# Patient Record
Sex: Female | Born: 1945 | Race: Black or African American | Hispanic: No | Smoking: Former smoker
Health system: Southern US, Community
[De-identification: ages and names within clinical notes are randomized; demographics above are authoritative.]

## PROBLEM LIST (undated history)

## (undated) DIAGNOSIS — E119 Type 2 diabetes mellitus without complications: Secondary | ICD-10-CM

## (undated) DIAGNOSIS — E7800 Pure hypercholesterolemia, unspecified: Secondary | ICD-10-CM

## (undated) DIAGNOSIS — K219 Gastro-esophageal reflux disease without esophagitis: Secondary | ICD-10-CM

## (undated) DIAGNOSIS — I10 Essential (primary) hypertension: Secondary | ICD-10-CM

## (undated) HISTORY — DX: Gastro-esophageal reflux disease without esophagitis: K21.9

## (undated) HISTORY — PX: BREAST BIOPSY: SHX20

## (undated) HISTORY — DX: Pure hypercholesterolemia, unspecified: E78.00

## (undated) HISTORY — DX: Essential (primary) hypertension: I10

## (undated) HISTORY — PX: TOOTH EXTRACTION: SHX859

## (undated) HISTORY — DX: Type 2 diabetes mellitus without complications: E11.9

---

## 1977-12-04 HISTORY — PX: TUBAL LIGATION: SHX77

## 2000-02-23 ENCOUNTER — Encounter: Payer: Self-pay | Admitting: Surgery

## 2000-02-23 ENCOUNTER — Encounter (INDEPENDENT_AMBULATORY_CARE_PROVIDER_SITE_OTHER): Payer: Self-pay

## 2000-02-23 ENCOUNTER — Ambulatory Visit (HOSPITAL_COMMUNITY): Admission: RE | Admit: 2000-02-23 | Discharge: 2000-02-23 | Payer: Self-pay | Admitting: Surgery

## 2001-08-08 ENCOUNTER — Encounter: Payer: Self-pay | Admitting: Internal Medicine

## 2001-08-08 ENCOUNTER — Ambulatory Visit (HOSPITAL_COMMUNITY): Admission: RE | Admit: 2001-08-08 | Discharge: 2001-08-08 | Payer: Self-pay | Admitting: Internal Medicine

## 2002-09-18 ENCOUNTER — Ambulatory Visit (HOSPITAL_COMMUNITY): Admission: RE | Admit: 2002-09-18 | Discharge: 2002-09-18 | Payer: Self-pay | Admitting: *Deleted

## 2002-09-18 ENCOUNTER — Encounter: Payer: Self-pay | Admitting: Internal Medicine

## 2003-09-30 ENCOUNTER — Ambulatory Visit (HOSPITAL_COMMUNITY): Admission: RE | Admit: 2003-09-30 | Discharge: 2003-09-30 | Payer: Self-pay | Admitting: Internal Medicine

## 2004-10-18 ENCOUNTER — Ambulatory Visit (HOSPITAL_COMMUNITY): Admission: RE | Admit: 2004-10-18 | Discharge: 2004-10-18 | Payer: Self-pay | Admitting: Internal Medicine

## 2005-10-25 ENCOUNTER — Ambulatory Visit (HOSPITAL_COMMUNITY): Admission: RE | Admit: 2005-10-25 | Discharge: 2005-10-25 | Payer: Self-pay | Admitting: Internal Medicine

## 2005-11-15 ENCOUNTER — Encounter: Admission: RE | Admit: 2005-11-15 | Discharge: 2005-11-15 | Payer: Self-pay | Admitting: Internal Medicine

## 2006-04-24 ENCOUNTER — Ambulatory Visit: Payer: Self-pay | Admitting: Gastroenterology

## 2006-05-14 ENCOUNTER — Encounter: Admission: RE | Admit: 2006-05-14 | Discharge: 2006-05-14 | Payer: Self-pay | Admitting: Internal Medicine

## 2006-11-14 ENCOUNTER — Encounter: Admission: RE | Admit: 2006-11-14 | Discharge: 2006-11-14 | Payer: Self-pay | Admitting: Internal Medicine

## 2007-12-12 ENCOUNTER — Encounter: Admission: RE | Admit: 2007-12-12 | Discharge: 2007-12-12 | Payer: Self-pay | Admitting: Internal Medicine

## 2008-06-04 ENCOUNTER — Ambulatory Visit: Payer: Self-pay | Admitting: Unknown Physician Specialty

## 2008-12-28 ENCOUNTER — Encounter: Admission: RE | Admit: 2008-12-28 | Discharge: 2008-12-28 | Payer: Self-pay | Admitting: Internal Medicine

## 2009-12-29 ENCOUNTER — Encounter: Admission: RE | Admit: 2009-12-29 | Discharge: 2009-12-29 | Payer: Self-pay | Admitting: Internal Medicine

## 2010-12-24 ENCOUNTER — Encounter: Payer: Self-pay | Admitting: Internal Medicine

## 2010-12-27 ENCOUNTER — Other Ambulatory Visit: Payer: Self-pay | Admitting: *Deleted

## 2010-12-27 DIAGNOSIS — Z1239 Encounter for other screening for malignant neoplasm of breast: Secondary | ICD-10-CM

## 2011-01-10 ENCOUNTER — Ambulatory Visit
Admission: RE | Admit: 2011-01-10 | Discharge: 2011-01-10 | Disposition: A | Discharge status: Home / Self Care | Payer: BC Managed Care – PPO | Source: Ambulatory Visit | Attending: *Deleted | Admitting: *Deleted

## 2011-01-10 DIAGNOSIS — Z1239 Encounter for other screening for malignant neoplasm of breast: Secondary | ICD-10-CM

## 2011-03-27 ENCOUNTER — Other Ambulatory Visit: Payer: Self-pay | Admitting: Internal Medicine

## 2011-03-27 DIAGNOSIS — Z1239 Encounter for other screening for malignant neoplasm of breast: Secondary | ICD-10-CM

## 2011-09-04 ENCOUNTER — Ambulatory Visit: Payer: Self-pay | Admitting: Gastroenterology

## 2011-09-04 LAB — HM COLONOSCOPY

## 2011-12-28 ENCOUNTER — Other Ambulatory Visit: Payer: Self-pay | Admitting: Internal Medicine

## 2011-12-28 DIAGNOSIS — Z1231 Encounter for screening mammogram for malignant neoplasm of breast: Secondary | ICD-10-CM

## 2012-01-12 ENCOUNTER — Ambulatory Visit
Admission: RE | Admit: 2012-01-12 | Discharge: 2012-01-12 | Disposition: A | Discharge status: Home / Self Care | Payer: Medicare Other | Source: Ambulatory Visit | Attending: Internal Medicine | Admitting: Internal Medicine

## 2012-01-12 ENCOUNTER — Ambulatory Visit
Admission: RE | Admit: 2012-01-12 | Discharge: 2012-01-12 | Disposition: A | Discharge status: Home / Self Care | Payer: BC Managed Care – PPO | Source: Ambulatory Visit | Attending: Internal Medicine | Admitting: Internal Medicine

## 2012-01-12 ENCOUNTER — Other Ambulatory Visit: Payer: Self-pay | Admitting: Internal Medicine

## 2012-01-12 DIAGNOSIS — Z1231 Encounter for screening mammogram for malignant neoplasm of breast: Secondary | ICD-10-CM

## 2012-10-10 ENCOUNTER — Telehealth: Payer: Self-pay | Admitting: *Deleted

## 2012-10-10 NOTE — Telephone Encounter (Signed)
[facility] on [address] to call in script for Crestor 10mg 

## 2012-10-11 NOTE — Telephone Encounter (Signed)
Open in error

## 2012-10-23 ENCOUNTER — Telehealth: Payer: Self-pay | Admitting: Internal Medicine

## 2012-10-23 NOTE — Telephone Encounter (Signed)
Refill request for advair 100-50 diskus [id number] 1 dose by mouth twice a day Patient does have an appointment on 11/14/12.

## 2012-10-25 MED ORDER — FLUTICASONE-SALMETEROL 100-50 MCG/DOSE IN AEPB: 1.0000 | INHALATION_SPRAY | Freq: Two times a day (BID) | RESPIRATORY_TRACT | Status: DC

## 2012-10-25 NOTE — Telephone Encounter (Signed)
Sent in to pharmacy.  

## 2012-11-13 ENCOUNTER — Encounter: Payer: Self-pay | Admitting: *Deleted

## 2012-11-14 ENCOUNTER — Ambulatory Visit: Payer: Medicare Other | Admitting: Internal Medicine

## 2012-11-14 ENCOUNTER — Encounter: Payer: Self-pay | Admitting: Internal Medicine

## 2012-11-14 DIAGNOSIS — R739 Hyperglycemia, unspecified: Secondary | ICD-10-CM | POA: Insufficient documentation

## 2012-11-14 DIAGNOSIS — I10 Essential (primary) hypertension: Secondary | ICD-10-CM | POA: Insufficient documentation

## 2012-11-14 DIAGNOSIS — D649 Anemia, unspecified: Secondary | ICD-10-CM | POA: Insufficient documentation

## 2012-11-14 DIAGNOSIS — E1165 Type 2 diabetes mellitus with hyperglycemia: Secondary | ICD-10-CM | POA: Insufficient documentation

## 2012-11-14 DIAGNOSIS — E7800 Pure hypercholesterolemia, unspecified: Secondary | ICD-10-CM | POA: Insufficient documentation

## 2012-11-14 DIAGNOSIS — E538 Deficiency of other specified B group vitamins: Secondary | ICD-10-CM | POA: Insufficient documentation

## 2012-11-14 DIAGNOSIS — K219 Gastro-esophageal reflux disease without esophagitis: Secondary | ICD-10-CM | POA: Insufficient documentation

## 2012-11-14 DIAGNOSIS — E119 Type 2 diabetes mellitus without complications: Secondary | ICD-10-CM | POA: Insufficient documentation

## 2012-11-14 NOTE — Progress Notes (Signed)
  Subjective:    Patient ID: Lisa Robles, female    DOB: [birth year], 66 y.o.   MRN: [id number]  HPI    Review of Systems     Objective:   Physical Exam        Assessment & Plan:  Opened for extraction only.

## 2012-11-14 NOTE — Assessment & Plan Note (Deleted)
ENT previously saw changes c/w acid reflux.

## 2012-12-06 ENCOUNTER — Encounter: Payer: Self-pay | Admitting: Internal Medicine

## 2012-12-06 ENCOUNTER — Ambulatory Visit (INDEPENDENT_AMBULATORY_CARE_PROVIDER_SITE_OTHER): Payer: Medicare PPO | Admitting: Internal Medicine

## 2012-12-06 VITALS — BP 140/88 | HR 88 | Temp 98.3°F | Ht 63.0 in | Wt 184.0 lb

## 2012-12-06 DIAGNOSIS — E7800 Pure hypercholesterolemia, unspecified: Secondary | ICD-10-CM

## 2012-12-06 DIAGNOSIS — D649 Anemia, unspecified: Secondary | ICD-10-CM

## 2012-12-06 DIAGNOSIS — R5381 Other malaise: Secondary | ICD-10-CM

## 2012-12-06 DIAGNOSIS — E538 Deficiency of other specified B group vitamins: Secondary | ICD-10-CM

## 2012-12-06 DIAGNOSIS — Z139 Encounter for screening, unspecified: Secondary | ICD-10-CM

## 2012-12-06 DIAGNOSIS — R5383 Other fatigue: Secondary | ICD-10-CM

## 2012-12-06 DIAGNOSIS — K219 Gastro-esophageal reflux disease without esophagitis: Secondary | ICD-10-CM

## 2012-12-06 DIAGNOSIS — I10 Essential (primary) hypertension: Secondary | ICD-10-CM

## 2012-12-06 DIAGNOSIS — R739 Hyperglycemia, unspecified: Secondary | ICD-10-CM

## 2012-12-06 DIAGNOSIS — R7309 Other abnormal glucose: Secondary | ICD-10-CM

## 2012-12-06 MED ORDER — CYANOCOBALAMIN 1000 MCG/ML IJ SOLN
1000.0000 ug | Freq: Once | INTRAMUSCULAR | Status: AC
  Administered 2012-12-06: 1000 ug via INTRAMUSCULAR

## 2012-12-08 ENCOUNTER — Encounter: Payer: Self-pay | Admitting: Internal Medicine

## 2012-12-08 NOTE — Progress Notes (Signed)
  Subjective:    Patient ID: Lisa Robles, female    DOB: 07-29-1946, 67 y.o.   MRN: [id number]  HPI 67 year old female with past history of hypercholesterolemia, hypertension, hyperglycemia and GERD.  She comes in today to follow up on these issues as well as for a complete physical exam.  Previously traveled to [place].  Had some drainage.  This has resolved.  No cardiac symptoms with increased activity or exertion.  Breathing stable.  No bowel problems.  Seeing Dr Logan Bores.  He is following her pap smears.  Had her EGD/colonoscopy last year.  States everything checked out fine.  Overall she feels she is doing well.    Past Medical History  Diagnosis Date  . Hypercholesteremia   . Hypertension   . GERD (gastroesophageal reflux disease)     Current Outpatient Prescriptions on File Prior to Visit  Medication Sig Dispense Refill  . albuterol (PROVENTIL HFA;VENTOLIN HFA) 108 (90 BASE) MCG/ACT inhaler Inhale 2 puffs into the lungs every 6 (six) hours as needed.      Marland Kitchen aspirin 81 MG tablet Take 81 mg by mouth daily.      . Fluticasone-Salmeterol (ADVAIR) 100-50 MCG/DOSE AEPB Inhale 1 puff into the lungs 2 (two) times daily.  60 each  0  . hydrochlorothiazide (HYDRODIURIL) 25 MG tablet Take 25 mg by mouth daily.      Marland Kitchen omeprazole (PRILOSEC) 20 MG capsule Take 20 mg by mouth daily.      . rosuvastatin (CRESTOR) 10 MG tablet 1/2 tab daily        Review of Systems Patient denies any headache, lightheadedness or dizziness. No sinus or allergy symptoms now.   No chest pain, tightness or palpitations.  No increased shortness of breath, cough or congestion.  No nausea or vomiting.  No acid reflux.  No abdominal pain or cramping.  No bowel change, such as diarrhea, constipation, BRBPR or melana.  No urine change.        Objective:   Physical Exam Filed Vitals:   12/06/12 1528  BP: 140/88  Pulse: 88  Temp: 98.3 F (52.62 C)   67 year old female in no acute distress.   HEENT:  Nares- clear.   Oropharynx - without lesions. NECK:  Supple.  Nontender.  No audible bruit.  HEART:  Appears to be regular. LUNGS:  No crackles or wheezing audible.  Respirations even and unlabored.  RADIAL PULSE:  Equal bilaterally.    BREASTS:  No nipple discharge or nipple retraction present.  Could not appreciate any distinct nodules or axillary adenopathy.  ABDOMEN:  Soft, nontender.  Bowel sounds present and normal.  No audible abdominal bruit.  GU:  Performed through GYN.     EXTREMITIES:  No increased edema present.  DP pulses palpable and equal bilaterally.           Assessment & Plan:  PREVIOUS ABNORMAL PAP.  Seeing Dr Logan Bores now.  Planning to follow up with GYN.    PULMONARY.  Breathing stable.    CARDIOVASCULAR.  Asymptomatic.  Stress test 06/28/09 - no ischemia. ECHO per my 06/25/09 note.  Discussed with cardiology - felt no further w/up warranted.    HEALTH MAINTENANCE.  Physical today.  Pelvic/pap through GYN.  Colonoscopy 04/24/06 - diverticulosis.  Just had repeat colonoscopy/EGD last year.  Obtain results.  Had mammogram this year - obtain results.  Pneumovax 07/18/11.

## 2012-12-08 NOTE — Assessment & Plan Note (Signed)
Symptoms controlled.  Same med regimen.  

## 2012-12-08 NOTE — Assessment & Plan Note (Addendum)
Check cbc with next labs.  Colonoscopy as outlined.  Just had follow up colonoscopy and EGD.  Obtain results.  Continue B12 injections.     

## 2012-12-08 NOTE — Assessment & Plan Note (Signed)
On crestor.  Low cholesterol diet and exercise.  Check lipid panel and liver function.  

## 2012-12-08 NOTE — Assessment & Plan Note (Signed)
Continue B12 injections.   

## 2012-12-08 NOTE — Assessment & Plan Note (Signed)
Low carb diet.  Exercise.  Check metabolic panel and a1c.

## 2012-12-08 NOTE — Assessment & Plan Note (Signed)
Blood pressure borderline today.  Have her spot check her pressures.  Get her back in soon to reassess.  Check metabolic panel.

## 2012-12-12 ENCOUNTER — Other Ambulatory Visit (INDEPENDENT_AMBULATORY_CARE_PROVIDER_SITE_OTHER): Payer: Medicare PPO

## 2012-12-12 DIAGNOSIS — R5381 Other malaise: Secondary | ICD-10-CM

## 2012-12-12 DIAGNOSIS — I10 Essential (primary) hypertension: Secondary | ICD-10-CM

## 2012-12-12 DIAGNOSIS — R739 Hyperglycemia, unspecified: Secondary | ICD-10-CM

## 2012-12-12 DIAGNOSIS — R7309 Other abnormal glucose: Secondary | ICD-10-CM

## 2012-12-12 DIAGNOSIS — D649 Anemia, unspecified: Secondary | ICD-10-CM

## 2012-12-12 DIAGNOSIS — R5383 Other fatigue: Secondary | ICD-10-CM

## 2012-12-12 DIAGNOSIS — E7800 Pure hypercholesterolemia, unspecified: Secondary | ICD-10-CM

## 2012-12-12 LAB — CBC WITH DIFFERENTIAL/PLATELET
Basophils Absolute: 0.1 10*3/uL (ref 0.0–0.1)
Basophils Relative: 0.6 % (ref 0.0–3.0)
Eosinophils Absolute: 0.1 10*3/uL (ref 0.0–0.7)
Eosinophils Relative: 1.4 % (ref 0.0–5.0)
HCT: 37.6 % (ref 36.0–46.0)
Hemoglobin: 12.3 g/dL (ref 12.0–15.0)
Lymphocytes Relative: 36.9 % (ref 12.0–46.0)
Lymphs Abs: 3 10*3/uL (ref 0.7–4.0)
MCHC: 32.7 g/dL (ref 30.0–36.0)
MCV: 92.4 fl (ref 78.0–100.0)
Monocytes Absolute: 0.8 10*3/uL (ref 0.1–1.0)
Monocytes Relative: 9.3 % (ref 3.0–12.0)
Neutro Abs: 4.2 10*3/uL (ref 1.4–7.7)
Neutrophils Relative %: 51.8 % (ref 43.0–77.0)
Platelets: 291 10*3/uL (ref 150.0–400.0)
RBC: 4.07 Mil/uL (ref 3.87–5.11)
RDW: 14.2 % (ref 11.5–14.6)
WBC: 8.2 10*3/uL (ref 4.5–10.5)

## 2012-12-12 LAB — LIPID PANEL
Cholesterol: 191 mg/dL (ref 0–200)
HDL: 51 mg/dL (ref >39.00)
LDL Cholesterol: 123 mg/dL — ABNORMAL HIGH (ref 0–99)
Total CHOL/HDL Ratio: 4
Triglycerides: 85 mg/dL (ref 0.0–149.0)
VLDL: 17 mg/dL (ref 0.0–40.0)

## 2012-12-12 LAB — TSH: TSH: 1.33 u[IU]/mL (ref 0.35–5.50)

## 2012-12-12 LAB — MICROALBUMIN / CREATININE URINE RATIO
Creatinine,U: 123.5 mg/dL
Microalb Creat Ratio: 0.6 mg/g (ref 0.0–30.0)
Microalb, Ur: 0.8 mg/dL (ref 0.0–1.9)

## 2012-12-12 LAB — HEPATIC FUNCTION PANEL
ALT: 18 U/L (ref 0–35)
AST: 22 U/L (ref 0–37)
Albumin: 3.8 g/dL (ref 3.5–5.2)
Alkaline Phosphatase: 65 U/L (ref 39–117)
Bilirubin, Direct: 0.1 mg/dL (ref 0.0–0.3)
Total Bilirubin: 0.7 mg/dL (ref 0.3–1.2)
Total Protein: 7.8 g/dL (ref 6.0–8.3)

## 2012-12-12 LAB — BASIC METABOLIC PANEL
BUN: 12 mg/dL (ref 6–23)
CO2: 29 mEq/L (ref 19–32)
Calcium: 9.5 mg/dL (ref 8.4–10.5)
Chloride: 100 mEq/L (ref 96–112)
Creatinine, Ser: 0.7 mg/dL (ref 0.4–1.2)
GFR: 101.79 mL/min (ref >60.00)
Glucose, Bld: 105 mg/dL — ABNORMAL HIGH (ref 70–99)
Potassium: 3.7 mEq/L (ref 3.5–5.1)
Sodium: 136 mEq/L (ref 135–145)

## 2012-12-12 LAB — HEMOGLOBIN A1C: Hgb A1c MFr Bld: 6.2 % (ref 4.6–6.5)

## 2013-01-03 ENCOUNTER — Other Ambulatory Visit: Payer: Self-pay | Admitting: Internal Medicine

## 2013-01-03 DIAGNOSIS — Z1231 Encounter for screening mammogram for malignant neoplasm of breast: Secondary | ICD-10-CM

## 2013-01-16 ENCOUNTER — Ambulatory Visit: Payer: Medicare PPO | Admitting: Internal Medicine

## 2013-01-27 ENCOUNTER — Encounter: Payer: Self-pay | Admitting: Internal Medicine

## 2013-01-27 ENCOUNTER — Ambulatory Visit (INDEPENDENT_AMBULATORY_CARE_PROVIDER_SITE_OTHER): Payer: Medicare PPO | Admitting: Internal Medicine

## 2013-01-27 VITALS — BP 120/70 | HR 69 | Temp 98.5°F | Ht 63.0 in | Wt 179.5 lb

## 2013-01-27 DIAGNOSIS — E7800 Pure hypercholesterolemia, unspecified: Secondary | ICD-10-CM

## 2013-01-27 DIAGNOSIS — N390 Urinary tract infection, site not specified: Secondary | ICD-10-CM

## 2013-01-27 DIAGNOSIS — R7309 Other abnormal glucose: Secondary | ICD-10-CM

## 2013-01-27 DIAGNOSIS — I10 Essential (primary) hypertension: Secondary | ICD-10-CM

## 2013-01-27 DIAGNOSIS — R739 Hyperglycemia, unspecified: Secondary | ICD-10-CM

## 2013-01-27 DIAGNOSIS — D649 Anemia, unspecified: Secondary | ICD-10-CM

## 2013-01-27 DIAGNOSIS — E538 Deficiency of other specified B group vitamins: Secondary | ICD-10-CM

## 2013-01-27 DIAGNOSIS — K219 Gastro-esophageal reflux disease without esophagitis: Secondary | ICD-10-CM

## 2013-01-27 LAB — POCT URINALYSIS DIPSTICK
Bilirubin, UA: NEGATIVE
Blood, UA: NEGATIVE
Glucose, UA: NEGATIVE
Ketones, UA: NEGATIVE
Spec Grav, UA: 1.02
Urobilinogen, UA: 1

## 2013-01-29 ENCOUNTER — Telehealth: Payer: Self-pay | Admitting: *Deleted

## 2013-01-29 LAB — URINE CULTURE: Colony Count: >=100000

## 2013-01-29 MED ORDER — CIPROFLOXACIN 500 MG/5ML (10%) PO SUSR: 250.0000 mg | Freq: Two times a day (BID) | ORAL | Status: AC

## 2013-01-29 NOTE — Telephone Encounter (Signed)
Sent in to pharmacy.  

## 2013-01-31 ENCOUNTER — Ambulatory Visit
Admission: RE | Admit: 2013-01-31 | Discharge: 2013-01-31 | Disposition: A | Discharge status: Home / Self Care | Payer: Medicare PPO | Source: Ambulatory Visit | Attending: Internal Medicine | Admitting: Internal Medicine

## 2013-01-31 DIAGNOSIS — Z1231 Encounter for screening mammogram for malignant neoplasm of breast: Secondary | ICD-10-CM

## 2013-01-31 LAB — HM MAMMOGRAPHY

## 2013-02-10 ENCOUNTER — Encounter: Payer: Self-pay | Admitting: Internal Medicine

## 2013-02-10 NOTE — Assessment & Plan Note (Signed)
Blood pressure ok.   Have her spot check her pressures.  Same medication regimen.  Follow metabolic panel.

## 2013-02-10 NOTE — Progress Notes (Signed)
Subjective:    Patient ID: Lisa Robles, female    DOB: [birth year], 67 y.o.   MRN: [id number]  HPI 67 year old female with past history of hypercholesterolemia, hypertension, hyperglycemia and GERD.  She comes in today for a scheduled follow up.  No cardiac symptoms with increased activity or exertion.  Breathing stable.  No bowel problems.  Seeing Dr Logan Bores.  He is following her pap smears.  Had her EGD/colonoscopy last year.  States everything checked out fine.  Overall she feels she is doing well.  Has been exercising.  States she has had a cyst around her rectum.  Since riding her bike, she has noticed some minimal discomfort around the rectum.  Wants checked.  She also has noticed some bilateral breast soreness/chest soreness.  States she has been lifting a child and feels the soreness is related to this.  Reproducible with movements.     Past Medical History  Diagnosis Date  . Hypercholesteremia   . Hypertension   . GERD (gastroesophageal reflux disease)     Current Outpatient Prescriptions on File Prior to Visit  Medication Sig Dispense Refill  . albuterol (PROVENTIL HFA;VENTOLIN HFA) 108 (90 BASE) MCG/ACT inhaler Inhale 2 puffs into the lungs every 6 (six) hours as needed.      Marland Kitchen aspirin 81 MG tablet Take 81 mg by mouth daily.      . Fluticasone-Salmeterol (ADVAIR) 100-50 MCG/DOSE AEPB Inhale 1 puff into the lungs 2 (two) times daily.  60 each  0  . hydrochlorothiazide (HYDRODIURIL) 25 MG tablet Take 25 mg by mouth daily.      Marland Kitchen omeprazole (PRILOSEC) 20 MG capsule Take 20 mg by mouth daily.      . rosuvastatin (CRESTOR) 10 MG tablet 1/2 tab daily       No current facility-administered medications on file prior to visit.    Review of Systems Patient denies any headache, lightheadedness or dizziness. No sinus or allergy symptoms now.   No chest pain, tightness or palpitations.  No increased shortness of breath, cough or congestion.  Breathing stable.  No nausea or vomiting.  No  acid reflux.  No abdominal pain or cramping.  No bowel change, such as diarrhea, constipation, BRBPR or melana.  No urine change.  Some irritation around the rectum as outlined.        Objective:   Physical Exam  Filed Vitals:   01/27/13 1054  BP: 120/70  Pulse: 69  Temp: 98.5 F (36.42 C)   67 year old female in no acute distress.   HEENT:  Nares- clear.  Oropharynx - without lesions. NECK:  Supple.  Nontender.  No audible bruit.  HEART:  Appears to be regular. LUNGS:  No crackles or wheezing audible.  Respirations even and unlabored.  RADIAL PULSE:  Equal bilaterally.    BREASTS:  She declined full breast exam.  No significant pain to palpation over the chest wall.    ABDOMEN:  Soft, nontender.  Bowel sounds present and normal.  No audible abdominal bruit.  GU:  Some minimal irritation - perirectal region.  No significant infection.       EXTREMITIES:  No increased edema present.  DP pulses palpable and equal bilaterally.           Assessment & Plan:  PREVIOUS ABNORMAL PAP.  Seeing Dr Logan Bores now.  Planning to follow up with GYN.    PULMONARY.  Breathing stable.   UTI.  Had uti two weeks ago.  Recheck  urine to confirm cleared.    CARDIOVASCULAR.  Asymptomatic.  Stress test 06/28/09 - no ischemia. ECHO per my 06/25/09 note. Discussed with cardiology - felt no further w/up warranted.    CHEST SORENESS.  Felt to be more msk in origin.  Tylenol.  Avoid heavy lifting.  Follow.    PERIRECTAL IRRITATION.  Nystatin cream as directed.  Follow.   HEALTH MAINTENANCE.  Physical 12/06/12.  Pelvic/pap through GYN.  Colonoscopy 04/24/06 - diverticulosis.  Just had repeat colonoscopy/EGD last year.  Obtain results.  Had mammogram this year - obtain results.  Pneumovax 07/18/11.

## 2013-02-10 NOTE — Assessment & Plan Note (Signed)
Symptoms controlled.  Same med regimen.  

## 2013-02-10 NOTE — Assessment & Plan Note (Signed)
On crestor.  Low cholesterol diet and exercise.  Lipid panel 12/12/12 revealed total cholesterol 191, triglycerides 85, HDL 51 and LDL 123.  Follow.

## 2013-02-10 NOTE — Assessment & Plan Note (Signed)
Low carb diet.  Exercise.  Check metabolic panel and a1c.  Last fasting glucose 105 (12/12/12).

## 2013-02-10 NOTE — Assessment & Plan Note (Signed)
Check cbc with next labs.  Colonoscopy as outlined.  Just had follow up colonoscopy and EGD.  Obtain results.  Continue B12 injections.

## 2013-02-10 NOTE — Assessment & Plan Note (Signed)
Continue B12 injections.   

## 2013-04-16 ENCOUNTER — Ambulatory Visit (INDEPENDENT_AMBULATORY_CARE_PROVIDER_SITE_OTHER): Payer: Medicare PPO | Admitting: *Deleted

## 2013-04-16 DIAGNOSIS — E538 Deficiency of other specified B group vitamins: Secondary | ICD-10-CM

## 2013-04-16 MED ORDER — CYANOCOBALAMIN 1000 MCG/ML IJ SOLN
1000.0000 ug | Freq: Once | INTRAMUSCULAR | Status: AC
  Administered 2013-04-16: 1000 ug via INTRAMUSCULAR

## 2013-04-25 ENCOUNTER — Other Ambulatory Visit: Payer: Medicare PPO

## 2013-04-30 ENCOUNTER — Ambulatory Visit: Payer: Medicare PPO | Admitting: Internal Medicine

## 2013-06-03 ENCOUNTER — Telehealth: Payer: Self-pay | Admitting: Internal Medicine

## 2013-06-03 MED ORDER — HYDROCHLOROTHIAZIDE 25 MG PO TABS: 25.0000 mg | ORAL_TABLET | Freq: Every day | ORAL | Status: DC

## 2013-06-03 NOTE — Telephone Encounter (Signed)
refilled 

## 2013-06-03 NOTE — Telephone Encounter (Signed)
hydrochlorothiazide (HYDRODIURIL) 25 MG tablet ° ° °

## 2013-06-04 ENCOUNTER — Other Ambulatory Visit (INDEPENDENT_AMBULATORY_CARE_PROVIDER_SITE_OTHER): Payer: Medicare PPO

## 2013-06-04 ENCOUNTER — Ambulatory Visit (INDEPENDENT_AMBULATORY_CARE_PROVIDER_SITE_OTHER): Payer: Medicare PPO | Admitting: *Deleted

## 2013-06-04 DIAGNOSIS — E538 Deficiency of other specified B group vitamins: Secondary | ICD-10-CM

## 2013-06-04 DIAGNOSIS — E7800 Pure hypercholesterolemia, unspecified: Secondary | ICD-10-CM

## 2013-06-04 DIAGNOSIS — I10 Essential (primary) hypertension: Secondary | ICD-10-CM

## 2013-06-04 DIAGNOSIS — R739 Hyperglycemia, unspecified: Secondary | ICD-10-CM

## 2013-06-04 DIAGNOSIS — R7309 Other abnormal glucose: Secondary | ICD-10-CM

## 2013-06-04 LAB — HEPATIC FUNCTION PANEL
AST: 20 U/L (ref 0–37)
Bilirubin, Direct: 0.1 mg/dL (ref 0.0–0.3)
Total Bilirubin: 0.5 mg/dL (ref 0.3–1.2)

## 2013-06-04 LAB — BASIC METABOLIC PANEL
BUN: 13 mg/dL (ref 6–23)
CO2: 28 mEq/L (ref 19–32)
Calcium: 9.8 mg/dL (ref 8.4–10.5)
Chloride: 104 mEq/L (ref 96–112)
Creatinine, Ser: 0.7 mg/dL (ref 0.4–1.2)
Glucose, Bld: 106 mg/dL — ABNORMAL HIGH (ref 70–99)

## 2013-06-04 LAB — LIPID PANEL
Total CHOL/HDL Ratio: 4
Triglycerides: 145 mg/dL (ref 0.0–149.0)

## 2013-06-04 LAB — HEMOGLOBIN A1C: Hgb A1c MFr Bld: 6.3 % (ref 4.6–6.5)

## 2013-06-04 MED ORDER — CYANOCOBALAMIN 1000 MCG/ML IJ SOLN
1000.0000 ug | Freq: Once | INTRAMUSCULAR | Status: AC
  Administered 2013-06-04: 1000 ug via INTRAMUSCULAR

## 2013-06-05 ENCOUNTER — Encounter: Payer: Self-pay | Admitting: *Deleted

## 2013-06-09 ENCOUNTER — Ambulatory Visit: Payer: Medicare PPO | Admitting: Internal Medicine

## 2013-06-17 ENCOUNTER — Encounter: Payer: Self-pay | Admitting: Internal Medicine

## 2013-06-27 ENCOUNTER — Ambulatory Visit: Payer: Medicare PPO | Admitting: Internal Medicine

## 2013-07-03 ENCOUNTER — Encounter: Payer: Self-pay | Admitting: Internal Medicine

## 2013-07-03 ENCOUNTER — Ambulatory Visit (INDEPENDENT_AMBULATORY_CARE_PROVIDER_SITE_OTHER): Payer: Medicare PPO | Admitting: Internal Medicine

## 2013-07-03 VITALS — BP 130/82 | HR 69 | Temp 98.3°F | Ht 63.0 in | Wt 182.8 lb

## 2013-07-03 DIAGNOSIS — Z139 Encounter for screening, unspecified: Secondary | ICD-10-CM

## 2013-07-03 DIAGNOSIS — E7800 Pure hypercholesterolemia, unspecified: Secondary | ICD-10-CM

## 2013-07-03 DIAGNOSIS — R7309 Other abnormal glucose: Secondary | ICD-10-CM

## 2013-07-03 DIAGNOSIS — L989 Disorder of the skin and subcutaneous tissue, unspecified: Secondary | ICD-10-CM

## 2013-07-03 DIAGNOSIS — I10 Essential (primary) hypertension: Secondary | ICD-10-CM

## 2013-07-03 DIAGNOSIS — D649 Anemia, unspecified: Secondary | ICD-10-CM

## 2013-07-03 DIAGNOSIS — R739 Hyperglycemia, unspecified: Secondary | ICD-10-CM

## 2013-07-03 DIAGNOSIS — K219 Gastro-esophageal reflux disease without esophagitis: Secondary | ICD-10-CM

## 2013-07-03 DIAGNOSIS — E538 Deficiency of other specified B group vitamins: Secondary | ICD-10-CM

## 2013-07-03 LAB — POCT URINALYSIS DIPSTICK
Bilirubin, UA: NEGATIVE
Glucose, UA: NEGATIVE
Nitrite, UA: NEGATIVE
Spec Grav, UA: 1.015
Urobilinogen, UA: 0.2

## 2013-07-03 MED ORDER — FLUTICASONE-SALMETEROL 100-50 MCG/DOSE IN AEPB: 1.0000 | INHALATION_SPRAY | Freq: Two times a day (BID) | RESPIRATORY_TRACT | Status: DC

## 2013-07-03 MED ORDER — CYANOCOBALAMIN 1000 MCG/ML IJ SOLN
1000.0000 ug | Freq: Once | INTRAMUSCULAR | Status: AC
  Administered 2013-07-03: 1000 ug via INTRAMUSCULAR

## 2013-07-03 MED ORDER — ALBUTEROL SULFATE HFA 108 (90 BASE) MCG/ACT IN AERS: 2.0000 | INHALATION_SPRAY | Freq: Four times a day (QID) | RESPIRATORY_TRACT | Status: DC | PRN

## 2013-07-06 ENCOUNTER — Encounter: Payer: Self-pay | Admitting: Internal Medicine

## 2013-07-06 ENCOUNTER — Telehealth: Payer: Self-pay | Admitting: Internal Medicine

## 2013-07-06 LAB — URINE CULTURE: Colony Count: 60000

## 2013-07-06 NOTE — Assessment & Plan Note (Signed)
Symptoms controlled.  Same med regimen.  

## 2013-07-06 NOTE — Progress Notes (Signed)
Subjective:    Patient ID: Lisa Robles, female    DOB: [birth year], 67 y.o.   MRN: [id number]  HPI 67 year old female with past history of hypercholesterolemia, hypertension, hyperglycemia and GERD.  She comes in today for a scheduled follow up.  No cardiac symptoms with increased activity or exertion.  Breathing stable.  No bowel problems.  Seeing Dr Logan Bores.  He is following her pap smears.  Had her EGD/colonoscopy last year.  States everything checked out fine.  Overall she feels she is doing well.  We discussed diet and exercise today. (low cholesterol diet).  Bowels stable.  She is noticing some discomfort with urination.  Does not occur regularly.  No hematuria.  On questioning, she just notices some pressure with urination.  No vaginal itching or discharge.   No bleeding.     Past Medical History  Diagnosis Date  . Hypercholesteremia   . Hypertension   . GERD (gastroesophageal reflux disease)     Current Outpatient Prescriptions on File Prior to Visit  Medication Sig Dispense Refill  . aspirin 81 MG tablet Take 81 mg by mouth daily.      . hydrochlorothiazide (HYDRODIURIL) 25 MG tablet Take 1 tablet (25 mg total) by mouth daily.  30 tablet  5  . omeprazole (PRILOSEC) 20 MG capsule Take 20 mg by mouth daily.      . rosuvastatin (CRESTOR) 10 MG tablet 1/2 tab daily       No current facility-administered medications on file prior to visit.    Review of Systems Patient denies any headache, lightheadedness or dizziness. No sinus or allergy symptoms now.   No chest pain, tightness or palpitations.  No increased shortness of breath, cough or congestion.  Breathing stable.  No nausea or vomiting.  No acid reflux.  No abdominal pain or cramping.  No bowel change, such as diarrhea, constipation, BRBPR or melana.  Some discomfort intermittently with urination.  No vaginal discharge or itching.       Objective:   Physical Exam  Filed Vitals:   07/03/13 1635  BP: 130/82  Pulse: 69   Temp: 98.3 F (109.67 C)   67 year old female in no acute distress.   HEENT:  Nares- clear.  Oropharynx - without lesions. NECK:  Supple.  Nontender.  No audible bruit.  HEART:  Appears to be regular. LUNGS:  No crackles or wheezing audible.  Respirations even and unlabored.  RADIAL PULSE:  Equal bilaterally.   ABDOMEN:  Soft, nontender.  Bowel sounds present and normal.  No audible abdominal.      EXTREMITIES:  No increased edema present.  DP pulses palpable and equal bilaterally.           Assessment & Plan:  PREVIOUS ABNORMAL PAP.  Seeing Dr Logan Bores now.  Planning to follow up with GYN.    DERMATOLOGY.  Right facial/temporal lesions.  Refer to dermatology for evaluation.    PULMONARY.  Breathing stable.   GU.  Increased pressure with urination.  Intermittent.  Urine dip negative.  Will send for culture.  Confirm no infection.  No symptoms currently.  Plans to follow up with Dr Logan Bores regarding her pelvic exam.      CARDIOVASCULAR.  Asymptomatic.  Stress test 06/28/09 - no ischemia. ECHO per my 06/25/09 note. Discussed with cardiology - felt no further w/up warranted.   HEALTH MAINTENANCE.  Physical 12/06/12.  Pelvic/pap through GYN.  Colonoscopy 04/24/06 - diverticulosis.  Just had repeat colonoscopy/EGD last year.  Still need results.  Mammogram 01/31/13 - Birads I.  Pneumovax 07/18/11.

## 2013-07-06 NOTE — Assessment & Plan Note (Signed)
Just had follow up colonoscopy and EGD.  Still need results.  Continue B12 injections.  Follow cbc.   

## 2013-07-06 NOTE — Telephone Encounter (Signed)
Please schedule pt for a physical in 1/15 (after 12/08/13).  Also schedule fasting labs 1-2 days before her physical.   Thanks.

## 2013-07-06 NOTE — Assessment & Plan Note (Signed)
Continue B12 injections.   

## 2013-07-06 NOTE — Assessment & Plan Note (Signed)
Blood pressure ok.  Same medication regimen.  Follow metabolic panel.     

## 2013-07-06 NOTE — Assessment & Plan Note (Signed)
Low carb diet.  Exercise.  Follow metabolic panel and a1c.   

## 2013-07-06 NOTE — Assessment & Plan Note (Signed)
On crestor.  Low cholesterol diet and exercise.  Follow lipid panel and liver function.      

## 2013-07-07 NOTE — Progress Notes (Signed)
LMTCB on cell phone.

## 2013-07-08 NOTE — Telephone Encounter (Signed)
LAB APPOINTMENT 12/21/13   CPX 1/13/5  MAILLED APPOINTMENT CARD WITH MEDICARE QUESTIONAIRE TO PT

## 2013-09-15 ENCOUNTER — Other Ambulatory Visit: Payer: Self-pay | Admitting: *Deleted

## 2013-09-16 MED ORDER — ROSUVASTATIN CALCIUM 10 MG PO TABS: 5.0000 mg | ORAL_TABLET | Freq: Every day | ORAL | Status: DC

## 2013-10-16 ENCOUNTER — Ambulatory Visit (INDEPENDENT_AMBULATORY_CARE_PROVIDER_SITE_OTHER): Payer: Medicare PPO | Admitting: Internal Medicine

## 2013-10-16 ENCOUNTER — Encounter: Payer: Self-pay | Admitting: Internal Medicine

## 2013-10-16 VITALS — BP 130/100 | HR 68 | Temp 97.9°F | Ht 63.0 in | Wt 180.2 lb

## 2013-10-16 DIAGNOSIS — J329 Chronic sinusitis, unspecified: Secondary | ICD-10-CM

## 2013-10-16 DIAGNOSIS — R309 Painful micturition, unspecified: Secondary | ICD-10-CM

## 2013-10-16 DIAGNOSIS — I10 Essential (primary) hypertension: Secondary | ICD-10-CM

## 2013-10-16 DIAGNOSIS — R300 Dysuria: Secondary | ICD-10-CM

## 2013-10-16 LAB — POCT URINALYSIS DIPSTICK
Bilirubin, UA: NEGATIVE
Glucose, UA: NEGATIVE
Ketones, UA: NEGATIVE
Urobilinogen, UA: 0.2

## 2013-10-16 MED ORDER — FLUTICASONE PROPIONATE 50 MCG/ACT NA SUSP: 2.0000 | Freq: Every day | NASAL | Status: DC

## 2013-10-16 MED ORDER — CEFUROXIME AXETIL 250 MG PO TABS: 250.0000 mg | ORAL_TABLET | Freq: Two times a day (BID) | ORAL | Status: DC

## 2013-10-16 NOTE — Patient Instructions (Signed)
Saline nasal spray - flush nose at least 2-3x/day.  Flonase nasal spray - 2 sprays each nostril one time per day.  (do in the evening).  Mucinex DM in the am and Robitussin DM in the evening.  Take the antibiotic as directed.    Monitor your blood pressure.

## 2013-10-16 NOTE — Progress Notes (Signed)
Pre-visit discussion using our clinic review tool. No additional management support is needed unless otherwise documented below in the visit note.  

## 2013-10-19 ENCOUNTER — Encounter: Payer: Self-pay | Admitting: Internal Medicine

## 2013-10-19 LAB — URINE CULTURE: Culture: >=100000

## 2013-10-19 NOTE — Assessment & Plan Note (Signed)
Blood pressure elevated today.  Have her stop the otc cold medicines.  Take her medications regularly.  Treat the infections.  Have her spot check her pressure.  Get her back in soon to reassess.

## 2013-10-19 NOTE — Progress Notes (Signed)
Subjective:    Patient ID: Lisa Robles, female    DOB: May 10, 1946, 67 y.o.   MRN: [id number]  Urinary Tract Infection   Cough  Sore Throat  Associated symptoms include coughing.  67 year old female with past history of hypercholesterolemia, hypertension, hyperglycemia and GERD.  She comes in today as a work in with concerns regarding a possible uti, cough and sore throat.   She reports noticing a sore throat -right side. Discomfort behind her right ear.  Some increased drainage and cough.  Symptoms started one week ago.  No nausea or vomiting.  No fever.  Does report increased nasal congestion and chest congestion.  Productive cough.  No sob.  Not using her inhalers regularly.  Also reports over the last few days noticing some discomfort with end urination.  No hematuria.    No vaginal itching or discharge.   No bleeding.     Past Medical History  Diagnosis Date  . Hypercholesteremia   . Hypertension   . GERD (gastroesophageal reflux disease)     Current Outpatient Prescriptions on File Prior to Visit  Medication Sig Dispense Refill  . albuterol (PROVENTIL HFA;VENTOLIN HFA) 108 (90 BASE) MCG/ACT inhaler Inhale 2 puffs into the lungs every 6 (six) hours as needed.  6.7 g  2  . aspirin 81 MG tablet Take 81 mg by mouth daily.      . Fluticasone-Salmeterol (ADVAIR) 100-50 MCG/DOSE AEPB Inhale 1 puff into the lungs 2 (two) times daily.  60 each  2  . hydrochlorothiazide (HYDRODIURIL) 25 MG tablet Take 1 tablet (25 mg total) by mouth daily.  30 tablet  5  . omeprazole (PRILOSEC) 20 MG capsule Take 20 mg by mouth daily.      . rosuvastatin (CRESTOR) 10 MG tablet Take 0.5 tablets (5 mg total) by mouth daily.  15 tablet  5   No current facility-administered medications on file prior to visit.    Review of Systems  Respiratory: Positive for cough.   Patient denies any headache, lightheadedness or dizziness.  Sore throat, drainage and cough as outlined.   No chest pain, tightness or  palpitations.  No increased shortness of breath.  Some chest congestion.   Breathing stable.  No nausea or vomiting.  No acid reflux.  No abdominal pain or cramping.  No bowel change, such as diarrhea, constipation, BRBPR or melana.  Some discomfort intermittently with urination.  No vaginal discharge or itching.       Objective:   Physical Exam  Filed Vitals:   10/16/13 1509  BP: 130/100  Pulse: 68  Temp: 97.9 F (36.6 C)   Blood pressure recheck:  51/73  67 year old female in no acute distress.   HEENT:  Nares- slightly erythematous turbinates.  Oropharynx - without lesions.  TMs visualized - without erythema.  NECK:  Supple.  Nontender.   HEART:  Appears to be regular. LUNGS:  No crackles or wheezing audible.  Respirations even and unlabored.  RADIAL PULSE:  Equal bilaterally.   ABDOMEN:  Soft, nontender.  Bowel sounds present and normal.  No audible abdominal.  BACK:  No CVA tenderness.       EXTREMITIES:  No increased edema present.  DP pulses palpable and equal bilaterally.           Assessment & Plan:  PREVIOUS ABNORMAL PAP.  Seeing Dr Logan Bores now.  Planning to follow up with GYN.    PULMONARY.  Breathing stable.  Treat infection.   GU.  Possible uti.  Urine dip reviewed.  Ceftin started for her sinus infection and uri.  Should cover for uti.  Await culture results.    CARDIOVASCULAR.  Asymptomatic.  Stress test 06/28/09 - no ischemia. ECHO per my 06/25/09 note. Discussed with cardiology - felt no further w/up warranted.   HEALTH MAINTENANCE.  Physical 12/06/12.  Pelvic/pap through GYN.  Colonoscopy 04/24/06 - diverticulosis.  Just had repeat colonoscopy/EGD last year.  Still need results.  Mammogram 01/31/13 - Birads I.  Pneumovax 07/18/11.

## 2013-10-19 NOTE — Assessment & Plan Note (Signed)
Sinusitis/uri.  Treat with saline nasal spray and flonase as directed.  mucinex and robitussin as directed.  Stop the otc cold medicines.  Ceftin as directed.  Follow.

## 2013-10-28 ENCOUNTER — Ambulatory Visit: Payer: Medicare PPO | Admitting: Internal Medicine

## 2013-12-11 ENCOUNTER — Other Ambulatory Visit (INDEPENDENT_AMBULATORY_CARE_PROVIDER_SITE_OTHER): Payer: Medicare PPO

## 2013-12-11 DIAGNOSIS — I10 Essential (primary) hypertension: Secondary | ICD-10-CM

## 2013-12-11 DIAGNOSIS — E7800 Pure hypercholesterolemia, unspecified: Secondary | ICD-10-CM

## 2013-12-11 DIAGNOSIS — D649 Anemia, unspecified: Secondary | ICD-10-CM

## 2013-12-11 DIAGNOSIS — R739 Hyperglycemia, unspecified: Secondary | ICD-10-CM

## 2013-12-11 DIAGNOSIS — R7309 Other abnormal glucose: Secondary | ICD-10-CM

## 2013-12-11 LAB — CBC WITH DIFFERENTIAL/PLATELET
Basophils Absolute: 0 10*3/uL (ref 0.0–0.1)
Basophils Relative: 0.3 % (ref 0.0–3.0)
EOS PCT: 2.4 % (ref 0.0–5.0)
Eosinophils Absolute: 0.2 10*3/uL (ref 0.0–0.7)
HCT: 37.4 % (ref 36.0–46.0)
HEMOGLOBIN: 12.7 g/dL (ref 12.0–15.0)
LYMPHS ABS: 2.8 10*3/uL (ref 0.7–4.0)
LYMPHS PCT: 38.2 % (ref 12.0–46.0)
MCHC: 34 g/dL (ref 30.0–36.0)
MCV: 91.1 fl (ref 78.0–100.0)
MONOS PCT: 9.3 % (ref 3.0–12.0)
Monocytes Absolute: 0.7 10*3/uL (ref 0.1–1.0)
Neutro Abs: 3.6 10*3/uL (ref 1.4–7.7)
Neutrophils Relative %: 49.8 % (ref 43.0–77.0)
PLATELETS: 297 10*3/uL (ref 150.0–400.0)
RBC: 4.11 Mil/uL (ref 3.87–5.11)
RDW: 13.6 % (ref 11.5–14.6)
WBC: 7.3 10*3/uL (ref 4.5–10.5)

## 2013-12-11 LAB — LIPID PANEL
Cholesterol: 216 mg/dL — ABNORMAL HIGH (ref 0–200)
HDL: 55.5 mg/dL (ref >39.00)
TRIGLYCERIDES: 100 mg/dL (ref 0.0–149.0)
Total CHOL/HDL Ratio: 4
VLDL: 20 mg/dL (ref 0.0–40.0)

## 2013-12-11 LAB — BASIC METABOLIC PANEL
BUN: 12 mg/dL (ref 6–23)
CHLORIDE: 103 meq/L (ref 96–112)
CO2: 30 meq/L (ref 19–32)
CREATININE: 0.8 mg/dL (ref 0.4–1.2)
Calcium: 9.6 mg/dL (ref 8.4–10.5)
GFR: 91.32 mL/min (ref >60.00)
GLUCOSE: 102 mg/dL — AB (ref 70–99)
Potassium: 4.1 mEq/L (ref 3.5–5.1)
Sodium: 140 mEq/L (ref 135–145)

## 2013-12-11 LAB — MICROALBUMIN / CREATININE URINE RATIO
Creatinine,U: 170.7 mg/dL
Microalb Creat Ratio: 0.1 mg/g (ref 0.0–30.0)
Microalb, Ur: 0.2 mg/dL (ref 0.0–1.9)

## 2013-12-11 LAB — HEPATIC FUNCTION PANEL
ALT: 15 U/L (ref 0–35)
AST: 18 U/L (ref 0–37)
Albumin: 3.8 g/dL (ref 3.5–5.2)
Alkaline Phosphatase: 63 U/L (ref 39–117)
BILIRUBIN DIRECT: 0.1 mg/dL (ref 0.0–0.3)
Total Bilirubin: 0.6 mg/dL (ref 0.3–1.2)
Total Protein: 7.1 g/dL (ref 6.0–8.3)

## 2013-12-11 LAB — FERRITIN: Ferritin: 97.9 ng/mL (ref 10.0–291.0)

## 2013-12-11 LAB — TSH: TSH: 1.76 u[IU]/mL (ref 0.35–5.50)

## 2013-12-11 LAB — HEMOGLOBIN A1C: Hgb A1c MFr Bld: 6 % (ref 4.6–6.5)

## 2013-12-11 LAB — LDL CHOLESTEROL, DIRECT: Direct LDL: 142.7 mg/dL

## 2013-12-16 ENCOUNTER — Ambulatory Visit (INDEPENDENT_AMBULATORY_CARE_PROVIDER_SITE_OTHER): Payer: Medicare PPO | Admitting: Internal Medicine

## 2013-12-16 ENCOUNTER — Encounter: Payer: Self-pay | Admitting: Internal Medicine

## 2013-12-16 VITALS — BP 120/80 | HR 75 | Temp 98.1°F | Ht 64.0 in | Wt 175.5 lb

## 2013-12-16 DIAGNOSIS — E538 Deficiency of other specified B group vitamins: Secondary | ICD-10-CM

## 2013-12-16 DIAGNOSIS — H938X9 Other specified disorders of ear, unspecified ear: Secondary | ICD-10-CM

## 2013-12-16 DIAGNOSIS — E7800 Pure hypercholesterolemia, unspecified: Secondary | ICD-10-CM

## 2013-12-16 DIAGNOSIS — R739 Hyperglycemia, unspecified: Secondary | ICD-10-CM

## 2013-12-16 DIAGNOSIS — K219 Gastro-esophageal reflux disease without esophagitis: Secondary | ICD-10-CM

## 2013-12-16 DIAGNOSIS — I10 Essential (primary) hypertension: Secondary | ICD-10-CM

## 2013-12-16 DIAGNOSIS — D649 Anemia, unspecified: Secondary | ICD-10-CM

## 2013-12-16 DIAGNOSIS — H938X1 Other specified disorders of right ear: Secondary | ICD-10-CM

## 2013-12-16 DIAGNOSIS — R7309 Other abnormal glucose: Secondary | ICD-10-CM

## 2013-12-16 DIAGNOSIS — R200 Anesthesia of skin: Secondary | ICD-10-CM

## 2013-12-16 DIAGNOSIS — R209 Unspecified disturbances of skin sensation: Secondary | ICD-10-CM

## 2013-12-16 DIAGNOSIS — R202 Paresthesia of skin: Secondary | ICD-10-CM

## 2013-12-16 DIAGNOSIS — R87619 Unspecified abnormal cytological findings in specimens from cervix uteri: Secondary | ICD-10-CM

## 2013-12-16 NOTE — Progress Notes (Signed)
Pre-visit discussion using our clinic review tool. No additional management support is needed unless otherwise documented below in the visit note.  

## 2013-12-16 NOTE — Progress Notes (Signed)
Subjective:    Patient ID: Lisa Robles, female    DOB: 12-Jan-1946, 68 y.o.   MRN: [id number]  HPI 68 year old female with past history of hypercholesterolemia, hypertension, hyperglycemia and GERD.  She comes in today to follow up on these issues as well as for a complete physical exam.    No cardiac symptoms with increased activity or exertion.  Breathing stable.  No bowel problems.  Seeing Dr Logan Bores.  He is following her pap smears.  Missed her f/u appt with him.  She is planning to reschedule.   Had her EGD/colonoscopy last year.  States everything checked out fine.  Overall she feels she is doing well.  We discussed diet and exercise today. (low cholesterol diet).  Bowels stable.  No vaginal itching or discharge.   No bleeding.  She does report some left hand tingling.  Notices if she is holding her hands up and is worse in the morning.  Also reports some right ear fullness.  Was questioning if had fluid in her ear.  No hearing change.  Already using Flonase and treating allergy symptoms.     Past Medical History  Diagnosis Date  . Hypercholesteremia   . Hypertension   . GERD (gastroesophageal reflux disease)     Current Outpatient Prescriptions on File Prior to Visit  Medication Sig Dispense Refill  . albuterol (PROVENTIL HFA;VENTOLIN HFA) 108 (90 BASE) MCG/ACT inhaler Inhale 2 puffs into the lungs every 6 (six) hours as needed.  6.7 g  2  . aspirin 81 MG tablet Take 81 mg by mouth daily.      . fluticasone (FLONASE) 50 MCG/ACT nasal spray Place 2 sprays into both nostrils daily.  16 g  1  . Fluticasone-Salmeterol (ADVAIR) 100-50 MCG/DOSE AEPB Inhale 1 puff into the lungs 2 (two) times daily.  60 each  2  . hydrochlorothiazide (HYDRODIURIL) 25 MG tablet Take 1 tablet (25 mg total) by mouth daily.  30 tablet  5  . rosuvastatin (CRESTOR) 10 MG tablet Take 0.5 tablets (5 mg total) by mouth daily.  15 tablet  5   No current facility-administered medications on file prior to visit.     Review of Systems Patient denies any headache, lightheadedness or dizziness. No sinus or allergy symptoms now.   Does report the ear fullness as outlined.  No chest pain, tightness or palpitations.  No increased shortness of breath, cough or congestion.  Breathing stable.  No nausea or vomiting.  No acid reflux.  No abdominal pain or cramping.  No bowel change, such as diarrhea, constipation, BRBPR or melana.  No vaginal discharge or itching.  No urinary change.  Left hand tingling as outlined.       Objective:   Physical Exam  Filed Vitals:   12/16/13 1047  BP: 120/80  Pulse: 75  Temp: 98.1 F (36.7 C)   Blood pressure recheck:  16/4  68 year old female in no acute distress.   HEENT:  Nares- clear.  Oropharynx - without lesions. NECK:  Supple.  Nontender.  No audible bruit.  HEART:  Appears to be regular. LUNGS:  No crackles or wheezing audible.  Respirations even and unlabored.  RADIAL PULSE:  Equal bilaterally.    BREASTS:  No nipple discharge or nipple retraction present.  Could not appreciate any distinct nodules or axillary adenopathy.  ABDOMEN:  Soft, nontender.  Bowel sounds present and normal.  No audible abdominal bruit.  GU:  Performed through gyn.   EXTREMITIES:  No increased edema present.  DP pulses palpable and equal bilaterally.          Assessment & Plan:  PREVIOUS ABNORMAL PAP.  Seeing Dr Logan BoresEvans now.  Planning to follow up with GYN.  Missed her last appt.  Reschedule appt.   PULMONARY.  Breathing stable.   CARDIOVASCULAR.  Asymptomatic.  Stress test 06/28/09 - no ischemia. ECHO per my 06/25/09 note. Discussed with cardiology - felt no further w/up warranted.   HEALTH MAINTENANCE.  Physical today..  Pelvic/pap through GYN.  Colonoscopy 04/24/06 - diverticulosis.  Just had repeat colonoscopy/EGD last year.  Still need results.  Mammogram 01/31/13 - Birads I.  Pneumovax 07/18/11.   I spent 40 minutes with the patient and more than 50% of the time was spent in  consultation regarding the above.

## 2013-12-17 ENCOUNTER — Other Ambulatory Visit: Payer: Self-pay | Admitting: *Deleted

## 2013-12-17 MED ORDER — HYDROCHLOROTHIAZIDE 25 MG PO TABS: 25.0000 mg | ORAL_TABLET | Freq: Every day | ORAL | Status: DC

## 2013-12-21 ENCOUNTER — Encounter: Payer: Self-pay | Admitting: Internal Medicine

## 2013-12-21 DIAGNOSIS — R202 Paresthesia of skin: Secondary | ICD-10-CM

## 2013-12-21 DIAGNOSIS — R200 Anesthesia of skin: Secondary | ICD-10-CM | POA: Insufficient documentation

## 2013-12-21 NOTE — Assessment & Plan Note (Signed)
On crestor.  Low cholesterol diet and exercise.  Follow lipid panel and liver function.      

## 2013-12-21 NOTE — Assessment & Plan Note (Signed)
Already on Flonase and treating allergy symptoms.  Persistent ear fullness.  No abnormality noted on exam.  Refer to ENT for evaluation given persistence.

## 2013-12-21 NOTE — Assessment & Plan Note (Signed)
Continue B12 injections.   

## 2013-12-21 NOTE — Assessment & Plan Note (Signed)
Low carb diet.  Exercise.  Follow metabolic panel and a1c.   

## 2013-12-21 NOTE — Assessment & Plan Note (Signed)
Some reproducible symptoms on exam (positive phalens).  Cock up splint as directed.  Follow.  If persistent symptoms, will require further evaluation.

## 2013-12-21 NOTE — Assessment & Plan Note (Signed)
Blood pressure under good control.  Follow metabolic panel.   

## 2013-12-21 NOTE — Assessment & Plan Note (Signed)
Just had follow up colonoscopy and EGD.  Still need results.  Continue B12 injections.  Follow cbc.   

## 2013-12-21 NOTE — Assessment & Plan Note (Signed)
Symptoms controlled.  Same med regimen.  

## 2014-01-22 ENCOUNTER — Other Ambulatory Visit: Payer: Self-pay

## 2014-01-22 DIAGNOSIS — Z1231 Encounter for screening mammogram for malignant neoplasm of breast: Secondary | ICD-10-CM

## 2014-01-24 ENCOUNTER — Ambulatory Visit (INDEPENDENT_AMBULATORY_CARE_PROVIDER_SITE_OTHER): Payer: Medicare PPO | Admitting: Family

## 2014-01-24 ENCOUNTER — Encounter: Payer: Self-pay | Admitting: Family

## 2014-01-24 VITALS — BP 140/90 | HR 94 | Temp 99.8°F | Resp 16 | Ht 64.0 in | Wt 178.0 lb

## 2014-01-24 DIAGNOSIS — R059 Cough, unspecified: Secondary | ICD-10-CM

## 2014-01-24 DIAGNOSIS — J111 Influenza due to unidentified influenza virus with other respiratory manifestations: Secondary | ICD-10-CM

## 2014-01-24 DIAGNOSIS — R05 Cough: Secondary | ICD-10-CM

## 2014-01-24 LAB — POCT RAPID INFLUENZA A&B: Influenza A+B Virus Ag-Direct(Rapid): POSITIVE

## 2014-01-24 MED ORDER — BENZONATATE 100 MG PO CAPS: 100.0000 mg | ORAL_CAPSULE | Freq: Three times a day (TID) | ORAL | Status: DC | PRN

## 2014-01-24 MED ORDER — FLUTICASONE-SALMETEROL 100-50 MCG/DOSE IN AEPB: 1.0000 | INHALATION_SPRAY | Freq: Two times a day (BID) | RESPIRATORY_TRACT | Status: DC

## 2014-01-24 MED ORDER — OSELTAMIVIR PHOSPHATE 75 MG PO CAPS: 75.0000 mg | ORAL_CAPSULE | Freq: Two times a day (BID) | ORAL | Status: DC

## 2014-01-24 MED ORDER — AMOXICILLIN 500 MG PO CAPS: 500.0000 mg | ORAL_CAPSULE | Freq: Three times a day (TID) | ORAL | Status: DC

## 2014-01-24 MED ORDER — ALBUTEROL SULFATE HFA 108 (90 BASE) MCG/ACT IN AERS: 2.0000 | INHALATION_SPRAY | Freq: Four times a day (QID) | RESPIRATORY_TRACT | Status: DC | PRN

## 2014-01-24 NOTE — Progress Notes (Signed)
Pre visit review using our clinic review tool, if applicable. No additional management support is needed unless otherwise documented below in the visit note. 

## 2014-01-24 NOTE — Progress Notes (Signed)
Subjective:    Patient ID: Lisa MillerGracetta W Robles, female    DOB: Oct 24, 1946, 68 y.o.   MRN: [id number]  HPI  Lisa Robles is a 68 yr old female who presents today with chief complaint of cough. She reports that symptoms started on 2/19 around 1 pm.  Symptoms are associated with sinus drainage, cough, and sore throat.  Nasal drainage is described as clear.  Temp today is 99.8. Reports that her daughter and her 462 yr old grand daughter have been sick. She works in the school system.   Review of Systems    see HPI  Past Medical History  Diagnosis Date  . Hypercholesteremia   . Hypertension   . GERD (gastroesophageal reflux disease)     History   Social History  . Marital Status: Widowed    Spouse Name: N/A    Number of Children: N/A  . Years of Education: N/A   Occupational History  . retired Runner, broadcasting/film/videoteacher    Social History Main Topics  . Smoking status: Former Smoker    Quit date: 12/04/1977  . Smokeless tobacco: Never Used  . Alcohol Use: No  . Drug Use: No  . Sexual Activity: Not on file   Other Topics Concern  . Not on file   Social History Narrative  . No narrative on file    Past Surgical History  Procedure Laterality Date  . Tubal ligation  1979  . Tooth extraction      tooth implantation    Family History  Problem Relation Age of Onset  . Hypercholesterolemia Brother   . Colon cancer Neg Hx   . Breast cancer Neg Hx     Allergies  Allergen Reactions  . Cinnamon Other (See Comments)    Mouth irritant   . Shellfish Allergy Rash    Current Outpatient Prescriptions on File Prior to Visit  Medication Sig Dispense Refill  . albuterol (PROVENTIL HFA;VENTOLIN HFA) 108 (90 BASE) MCG/ACT inhaler Inhale 2 puffs into the lungs every 6 (six) hours as needed.  6.7 g  2  . aspirin 81 MG tablet Take 81 mg by mouth daily.      . fluticasone (FLONASE) 50 MCG/ACT nasal spray Place 2 sprays into both nostrils daily.  16 g  1  . Fluticasone-Salmeterol (ADVAIR) 100-50  MCG/DOSE AEPB Inhale 1 puff into the lungs 2 (two) times daily.  60 each  2  . hydrochlorothiazide (HYDRODIURIL) 25 MG tablet Take 1 tablet (25 mg total) by mouth daily.  30 tablet  5  . rosuvastatin (CRESTOR) 10 MG tablet Take 0.5 tablets (5 mg total) by mouth daily.  15 tablet  5   No current facility-administered medications on file prior to visit.    Ht 5\' 4"  (1.626 m)    Objective:   Physical Exam  Constitutional: She is oriented to person, place, and time. She appears well-developed and well-nourished. No distress.  HENT:  Head: Normocephalic and atraumatic.  Right Ear: Tympanic membrane and ear canal normal.  Left Ear: Tympanic membrane and ear canal normal.  Mouth/Throat: Posterior oropharyngeal erythema present. No oropharyngeal exudate or posterior oropharyngeal edema.  Cardiovascular: Normal rate and regular rhythm.   No murmur heard. Pulmonary/Chest: Effort normal and breath sounds normal. No respiratory distress. She has no wheezes. She has no rales. She exhibits no tenderness.  Lymphadenopathy:    She has no cervical adenopathy.  Neurological: She is alert and oriented to person, place, and time.  Skin: Skin is warm and dry.  Psychiatric: She has a normal mood and affect. Her behavior is normal. Judgment and thought content normal.          Assessment & Plan:

## 2014-01-24 NOTE — Patient Instructions (Addendum)
Your testing shows that you have the flu.  I have sent tessalon to your pharmacy for cough. You may use tylenol as needed for fever or body aches. Make sure to drink plenty of fluids and get plenty of rest.   Call if you develop fever >101 or if cough worsens.  Call if symptoms worsen, or if not improved in 2-3 days.

## 2014-01-24 NOTE — Assessment & Plan Note (Signed)
Rapid flu testing in office today is positive. She did have a flu shot. Will rx with tamiflu as she is just within the 48 hour window. She is instructed to take tylenol as needed for comfort, drink plenty of fluids and get plenty of rest. Tessalon was added for cough.  She is instructed to call if fever >101  or if cough worsens.  Call if symptoms worsen, or if not improved in 2-3 days.

## 2014-01-26 ENCOUNTER — Ambulatory Visit: Payer: Self-pay | Admitting: Podiatry

## 2014-02-03 ENCOUNTER — Ambulatory Visit (INDEPENDENT_AMBULATORY_CARE_PROVIDER_SITE_OTHER): Payer: Medicare PPO | Admitting: Adult Health

## 2014-02-03 ENCOUNTER — Encounter: Payer: Self-pay | Admitting: Adult Health

## 2014-02-03 VITALS — BP 110/78 | HR 74 | Temp 98.5°F | Resp 14 | Wt 174.0 lb

## 2014-02-03 DIAGNOSIS — R05 Cough: Secondary | ICD-10-CM

## 2014-02-03 DIAGNOSIS — R059 Cough, unspecified: Secondary | ICD-10-CM

## 2014-02-03 MED ORDER — BENZONATATE 100 MG PO CAPS: 100.0000 mg | ORAL_CAPSULE | Freq: Three times a day (TID) | ORAL | Status: DC | PRN

## 2014-02-03 MED ORDER — GUAIFENESIN-CODEINE 100-10 MG/5ML PO SOLN: 5.0000 mL | Freq: Three times a day (TID) | ORAL | Status: DC | PRN

## 2014-02-03 NOTE — Progress Notes (Signed)
   Subjective:    Patient ID: Lisa Robles, female    DOB: [birth year], 68 y.o.   MRN: [id number]  Cough Associated symptoms include postnasal drip. Pertinent negatives include no chills, fever, rhinorrhea, sore throat, shortness of breath or wheezing.   Pt is a 68 y/o female who was recently seen on 01/24/14 and treated with tamiflu for influenza. She was also given a prescription for tessalon for her cough. She presents to clinic with ongoing symptoms of cough and congestion. She reports feeling improved. No fever or chills. Reports that cough is keeping her up at night.  Current Outpatient Prescriptions on File Prior to Visit  Medication Sig Dispense Refill  . albuterol (PROVENTIL HFA;VENTOLIN HFA) 108 (90 BASE) MCG/ACT inhaler Inhale 2 puffs into the lungs every 6 (six) hours as needed.  6.7 g  2  . aspirin 81 MG tablet Take 81 mg by mouth daily.      . fluticasone (FLONASE) 50 MCG/ACT nasal spray Place 2 sprays into both nostrils daily.  16 g  1  . Fluticasone-Salmeterol (ADVAIR) 100-50 MCG/DOSE AEPB Inhale 1 puff into the lungs 2 (two) times daily.  60 each  2  . hydrochlorothiazide (HYDRODIURIL) 25 MG tablet Take 1 tablet (25 mg total) by mouth daily.  30 tablet  5  . rosuvastatin (CRESTOR) 10 MG tablet Take 0.5 tablets (5 mg total) by mouth daily.  15 tablet  5   No current facility-administered medications on file prior to visit.    Review of Systems  Constitutional: Positive for fatigue. Negative for fever and chills.  HENT: Positive for congestion and postnasal drip. Negative for rhinorrhea, sinus pressure and sore throat.   Respiratory: Positive for cough. Negative for shortness of breath and wheezing.   All other systems reviewed and are negative.       Objective:   Physical Exam  Constitutional: She is oriented to person, place, and time. She appears well-developed and well-nourished. No distress.  HENT:  Head: Normocephalic and atraumatic.  Right Ear: External ear  normal.  Left Ear: External ear normal.  Mouth/Throat: Oropharynx is clear and moist.  Eyes: Conjunctivae and EOM are normal.  Neck: Normal range of motion. Neck supple.  Cardiovascular: Normal rate, regular rhythm and normal heart sounds.  Exam reveals no gallop and no friction rub.   No murmur heard. Pulmonary/Chest: Effort normal and breath sounds normal. No respiratory distress. She has no wheezes. She has no rales.  Good air movement throughout lung fields  Musculoskeletal: Normal range of motion.  Lymphadenopathy:    She has no cervical adenopathy.  Neurological: She is alert and oriented to person, place, and time.  Skin: Skin is warm and dry.  Psychiatric: She has a normal mood and affect. Her behavior is normal. Judgment and thought content normal.       Assessment & Plan:   1. Cough Improved from 01/24/14. No fever, chills, sob. Continue tessalon during the day prn and robitussin ac at bedtime as needed for severe cough. Lungs were clear with good air movement. No indication for antibiotic at this time. If no improvement within 3-4 days she will need to be re-evaluated.

## 2014-02-03 NOTE — Patient Instructions (Signed)
  For severe cough take Robitussin AC (this medication may cause sleepiness). Do not take if you will be driving.  During the day you can take tessalon for your cough.  Drink fluids to stay hydrated.  Please call if your symptoms worsen or fail to improve within 3-4 days.

## 2014-02-03 NOTE — Progress Notes (Signed)
Pre visit review using our clinic review tool, if applicable. No additional management support is needed unless otherwise documented below in the visit note. 

## 2014-02-04 ENCOUNTER — Ambulatory Visit (INDEPENDENT_AMBULATORY_CARE_PROVIDER_SITE_OTHER): Payer: Medicare PPO | Admitting: Podiatry

## 2014-02-04 ENCOUNTER — Encounter: Payer: Self-pay | Admitting: Podiatry

## 2014-02-04 VITALS — BP 111/61 | HR 76 | Resp 16 | Ht 63.0 in | Wt 173.0 lb

## 2014-02-04 DIAGNOSIS — B079 Viral wart, unspecified: Secondary | ICD-10-CM

## 2014-02-04 NOTE — Patient Instructions (Signed)

## 2014-02-04 NOTE — Progress Notes (Signed)
   Subjective:    Patient ID: Lisa Robles, female    DOB: June 17, 1946, 68 y.o.   MRN: [id number]  HPI Comments: i have this place on the bottom of my left foot and [facility] told me it was a sweat gland. Ive had it for 35 yrs and it does hurt. i have taken a knife to it and cut it out but it comes back. i went to the dermatologist a month ago, they did numb it and cut it out and sent it out for pathology and it came back as a wart. It is better since she messed with it, it hadnt hurt.  Foot Pain Associated symptoms include coughing.      Review of Systems  Constitutional: Negative.   HENT:       Sinus problems  Eyes: Negative.   Respiratory: Positive for cough.   Cardiovascular: Negative.   Endocrine: Negative.   Genitourinary: Negative.   Musculoskeletal: Negative.   Skin: Negative.   Allergic/Immunologic: Positive for food allergies.  Neurological: Negative.   Hematological: Negative.   Psychiatric/Behavioral: Negative.        Objective:   Physical Exam I have reviewed her past medical history medications allergies surgeries and social history. Vital signs are stable she is alert and oriented x3. Strong palpable pulses bilateral foot. Neurologic sensorium is intact per Semmes-Weinstein monofilament. Deep tendon reflexes are intact bilateral muscle strength is 5 over 5 dorsiflexors plantar flexors inverters everters all intrinsic musculature is intact. Orthopedic evaluation demonstrates all joints distal to the ankle a full range of motion without crepitus cutaneous evaluation demonstrates supple well hydrated cutis with exception of a solitary verrucoid lesion to the plantar aspect of the second metatarsophalangeal joint area of the left foot. There is no erythema edema cellulitis drainage or odor associated with this.        Assessment & Plan:  Assessment: Verruca plantaris left foot.  Plan: Discussed etiology pathology conservative versus surgical therapies at  this point we injected a small amount of local anesthetic consisting of 50-50 Mr. Marcaine plain and lidocaine  with epinephrine sublesionally. We then performed up surgical curettage. The lesion was sent for pathologic evaluation. I dressed a compressive dressing was applied. The patient received both oral and written home-going instructions for the care of this wound I will followup with her in one week.

## 2014-02-09 ENCOUNTER — Telehealth: Payer: Self-pay | Admitting: Internal Medicine

## 2014-02-09 NOTE — Telephone Encounter (Signed)
If she had been getting these monthly and has just missed the last few months, ok to restart monthly B12 injections.

## 2014-02-09 NOTE — Telephone Encounter (Signed)
Please advise 

## 2014-02-09 NOTE — Telephone Encounter (Signed)
Pt stated she used to get b12 shots and hasn't have one 3-4 months and wanted to get another b12 shot Is it ok to shedule

## 2014-02-10 NOTE — Telephone Encounter (Signed)
Left detailed message notifying patient that she is okay to schedule a nurse visit for the B12.

## 2014-02-11 ENCOUNTER — Ambulatory Visit (INDEPENDENT_AMBULATORY_CARE_PROVIDER_SITE_OTHER): Payer: Medicare PPO | Admitting: Podiatry

## 2014-02-11 VITALS — BP 126/79 | HR 76 | Resp 16 | Ht 63.0 in | Wt 172.0 lb

## 2014-02-11 DIAGNOSIS — Z9889 Other specified postprocedural states: Secondary | ICD-10-CM

## 2014-02-11 DIAGNOSIS — B079 Viral wart, unspecified: Secondary | ICD-10-CM

## 2014-02-11 DIAGNOSIS — Q828 Other specified congenital malformations of skin: Secondary | ICD-10-CM

## 2014-02-11 NOTE — Progress Notes (Signed)
She presents today for followup of curettage plantar lesion left foot. 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 She denies fever chills nausea vomiting muscle aches pains continues to soak in Epsom salts and water.  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 No signs of infection today plantar left foot.  Assessment: Well-healing surgical foot left.  Plan: Discussed etiology pathology conservative versus surgical therapies at this point I did suggest she followup on an as-needed basis for debridement of porokeratotic with salicylic acid packs.

## 2014-02-12 ENCOUNTER — Encounter: Payer: Self-pay | Admitting: Podiatry

## 2014-02-13 ENCOUNTER — Ambulatory Visit
Admission: RE | Admit: 2014-02-13 | Discharge: 2014-02-13 | Disposition: A | Discharge status: Home / Self Care | Payer: Medicare PPO | Source: Ambulatory Visit

## 2014-02-13 DIAGNOSIS — Z1231 Encounter for screening mammogram for malignant neoplasm of breast: Secondary | ICD-10-CM

## 2014-02-13 LAB — HM MAMMOGRAPHY: HM MAMMO: NEGATIVE

## 2014-02-17 ENCOUNTER — Encounter: Payer: Self-pay | Admitting: Internal Medicine

## 2014-04-13 ENCOUNTER — Other Ambulatory Visit: Payer: Medicare PPO

## 2014-04-15 ENCOUNTER — Ambulatory Visit: Payer: Medicare PPO | Admitting: Internal Medicine

## 2014-04-17 ENCOUNTER — Other Ambulatory Visit: Payer: Medicare PPO

## 2014-04-21 ENCOUNTER — Ambulatory Visit: Payer: Medicare PPO | Admitting: Internal Medicine

## 2014-05-12 ENCOUNTER — Other Ambulatory Visit (INDEPENDENT_AMBULATORY_CARE_PROVIDER_SITE_OTHER): Payer: Medicare PPO

## 2014-05-12 DIAGNOSIS — E7800 Pure hypercholesterolemia, unspecified: Secondary | ICD-10-CM

## 2014-05-12 DIAGNOSIS — I10 Essential (primary) hypertension: Secondary | ICD-10-CM

## 2014-05-12 LAB — BASIC METABOLIC PANEL
BUN: 12 mg/dL (ref 6–23)
CO2: 28 mEq/L (ref 19–32)
Calcium: 9.4 mg/dL (ref 8.4–10.5)
Chloride: 103 mEq/L (ref 96–112)
Creatinine, Ser: 0.8 mg/dL (ref 0.4–1.2)
GFR: 98.26 mL/min (ref >60.00)
Glucose, Bld: 108 mg/dL — ABNORMAL HIGH (ref 70–99)
POTASSIUM: 3.8 meq/L (ref 3.5–5.1)
SODIUM: 137 meq/L (ref 135–145)

## 2014-05-12 LAB — LIPID PANEL
CHOL/HDL RATIO: 3
Cholesterol: 170 mg/dL (ref 0–200)
HDL: 50.9 mg/dL (ref >39.00)
LDL CALC: 99 mg/dL (ref 0–99)
NonHDL: 119.1
Triglycerides: 103 mg/dL (ref 0.0–149.0)
VLDL: 20.6 mg/dL (ref 0.0–40.0)

## 2014-05-12 LAB — HEPATIC FUNCTION PANEL
ALT: 14 U/L (ref 0–35)
AST: 18 U/L (ref 0–37)
Albumin: 3.7 g/dL (ref 3.5–5.2)
Alkaline Phosphatase: 58 U/L (ref 39–117)
BILIRUBIN DIRECT: 0 mg/dL (ref 0.0–0.3)
BILIRUBIN TOTAL: 0.5 mg/dL (ref 0.2–1.2)
Total Protein: 7.3 g/dL (ref 6.0–8.3)

## 2014-05-14 ENCOUNTER — Encounter: Payer: Self-pay | Admitting: Internal Medicine

## 2014-05-14 ENCOUNTER — Ambulatory Visit (INDEPENDENT_AMBULATORY_CARE_PROVIDER_SITE_OTHER): Payer: Medicare PPO | Admitting: Internal Medicine

## 2014-05-14 VITALS — BP 120/80 | HR 88 | Temp 98.2°F | Wt 179.5 lb

## 2014-05-14 DIAGNOSIS — E538 Deficiency of other specified B group vitamins: Secondary | ICD-10-CM

## 2014-05-14 DIAGNOSIS — D649 Anemia, unspecified: Secondary | ICD-10-CM

## 2014-05-14 DIAGNOSIS — R87619 Unspecified abnormal cytological findings in specimens from cervix uteri: Secondary | ICD-10-CM

## 2014-05-14 DIAGNOSIS — R739 Hyperglycemia, unspecified: Secondary | ICD-10-CM

## 2014-05-14 DIAGNOSIS — E7800 Pure hypercholesterolemia, unspecified: Secondary | ICD-10-CM

## 2014-05-14 DIAGNOSIS — I10 Essential (primary) hypertension: Secondary | ICD-10-CM

## 2014-05-14 DIAGNOSIS — R7309 Other abnormal glucose: Secondary | ICD-10-CM

## 2014-05-14 DIAGNOSIS — K219 Gastro-esophageal reflux disease without esophagitis: Secondary | ICD-10-CM

## 2014-05-14 LAB — VITAMIN B12: VITAMIN B 12: 197 pg/mL — AB (ref 211–911)

## 2014-05-14 MED ORDER — CYANOCOBALAMIN 1000 MCG/ML IJ SOLN
1000.0000 ug | Freq: Once | INTRAMUSCULAR | Status: AC
  Administered 2014-05-14: 1000 ug via INTRAMUSCULAR

## 2014-05-14 NOTE — Progress Notes (Signed)
Pre visit review using our clinic review tool, if applicable. No additional management support is needed unless otherwise documented below in the visit note. 

## 2014-05-15 ENCOUNTER — Encounter: Payer: Self-pay | Admitting: *Deleted

## 2014-05-19 ENCOUNTER — Encounter: Payer: Self-pay | Admitting: Internal Medicine

## 2014-05-19 DIAGNOSIS — R87619 Unspecified abnormal cytological findings in specimens from cervix uteri: Secondary | ICD-10-CM | POA: Insufficient documentation

## 2014-05-19 NOTE — Assessment & Plan Note (Signed)
On crestor.  Low cholesterol diet and exercise.  Follow lipid panel and liver function.      

## 2014-05-19 NOTE — Assessment & Plan Note (Signed)
Low carb diet.  Exercise.  Follow metabolic panel and a1c.

## 2014-05-19 NOTE — Assessment & Plan Note (Signed)
Seeing Dr Feliberto GottronSchermerhorn now.  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

## 2014-05-19 NOTE — Assessment & Plan Note (Signed)
Blood pressure under good control.  Follow metabolic panel.   

## 2014-05-19 NOTE — Assessment & Plan Note (Signed)
Just had follow up colonoscopy and EGD.  Still need results.  Continue B12 injections.  Follow cbc.

## 2014-05-19 NOTE — Assessment & Plan Note (Signed)
Continue B12 injections.   

## 2014-05-19 NOTE — Assessment & Plan Note (Signed)
Symptoms controlled.  Same med regimen.  

## 2014-05-19 NOTE — Progress Notes (Signed)
Subjective:    Patient ID: Lisa Robles, female    DOB: Mar 29, 1946, 68 y.o.   MRN: [id number]  HPI 68 year old female with past history of hypercholesterolemia, hypertension, hyperglycemia and GERD.  She comes in today for a scheduled follow up.  No cardiac symptoms with increased activity or exertion.  Breathing stable.  No bowel problems.  Was seeing Dr Logan BoresEvans.  He had been following her pap smears.  Now seeing Dr Feliberto GottronSchermerhorn.  Planning to f/u in 7/15 for biopsy (for persistent HPV).  Had her EGD/colonoscopy last year.  States everything checked out fine.  Overall she feels she is doing well.   Bowels stable.   No vaginal itching or discharge.   No bleeding.  Saw dermatology.  States everything checked out fine.     Past Medical History  Diagnosis Date  . Hypercholesteremia   . Hypertension   . GERD (gastroesophageal reflux disease)     Current Outpatient Prescriptions on File Prior to Visit  Medication Sig Dispense Refill  . albuterol (PROVENTIL HFA;VENTOLIN HFA) 108 (90 BASE) MCG/ACT inhaler Inhale 2 puffs into the lungs every 6 (six) hours as needed.  6.7 g  2  . aspirin 81 MG tablet Take 81 mg by mouth daily.      . fluticasone (FLONASE) 50 MCG/ACT nasal spray Place 2 sprays into both nostrils daily.  16 g  1  . Fluticasone-Salmeterol (ADVAIR) 100-50 MCG/DOSE AEPB Inhale 1 puff into the lungs 2 (two) times daily.  60 each  2  . hydrochlorothiazide (HYDRODIURIL) 25 MG tablet Take 1 tablet (25 mg total) by mouth daily.  30 tablet  5  . rosuvastatin (CRESTOR) 10 MG tablet Take 0.5 tablets (5 mg total) by mouth daily.  15 tablet  5   No current facility-administered medications on file prior to visit.    Review of Systems Patient denies any headache, lightheadedness or dizziness. No sinus or allergy symptoms now.   No chest pain, tightness or palpitations.  No increased shortness of breath, cough or congestion.  Breathing stable.  No nausea or vomiting.  No acid reflux.  No  abdominal pain or cramping.  No bowel change, such as diarrhea, constipation, BRBPR or melana.  No vaginal discharge or itching.       Objective:   Physical Exam  Filed Vitals:   05/14/14 1401  BP: 120/80  Pulse: 88  Temp: 98.2 F (2536.128 C)   68 year old female in no acute distress.   HEENT:  Nares- clear.  Oropharynx - without lesions. NECK:  Supple.  Nontender.  No audible bruit.  HEART:  Appears to be regular. LUNGS:  No crackles or wheezing audible.  Respirations even and unlabored.  RADIAL PULSE:  Equal bilaterally.   ABDOMEN:  Soft, nontender.  Bowel sounds present and normal.  No audible abdominal.      EXTREMITIES:  No increased edema present.  DP pulses palpable and equal bilaterally.           Assessment & Plan:  DERMATOLOGY.  Right facial/temporal lesions.  Saw dermatology.  States everything checked out fine.     PULMONARY.  Breathing stable.   CARDIOVASCULAR.  Asymptomatic.  Stress test 06/28/09 - no ischemia. ECHO per my 06/25/09 note. Discussed with cardiology - felt no further w/up warranted.   HEALTH MAINTENANCE.  Physical 12/16/13.  Pelvic/pap through GYN.  Colonoscopy 04/24/06 - diverticulosis.  Just had repeat colonoscopy/EGD last year.  Need results.  Mammogram 02/16/14 - Birads I.  Pneumovax 07/18/11.

## 2014-06-10 ENCOUNTER — Encounter: Payer: Self-pay | Admitting: Adult Health

## 2014-06-10 ENCOUNTER — Ambulatory Visit (INDEPENDENT_AMBULATORY_CARE_PROVIDER_SITE_OTHER): Payer: Medicare PPO | Admitting: Adult Health

## 2014-06-10 VITALS — BP 122/82 | HR 83 | Temp 98.2°F | Resp 14 | Wt 173.8 lb

## 2014-06-10 DIAGNOSIS — L293 Anogenital pruritus, unspecified: Secondary | ICD-10-CM

## 2014-06-10 DIAGNOSIS — R82998 Other abnormal findings in urine: Secondary | ICD-10-CM

## 2014-06-10 DIAGNOSIS — N898 Other specified noninflammatory disorders of vagina: Secondary | ICD-10-CM

## 2014-06-10 DIAGNOSIS — R8290 Unspecified abnormal findings in urine: Secondary | ICD-10-CM

## 2014-06-10 LAB — POCT URINALYSIS DIPSTICK
Bilirubin, UA: NEGATIVE
Blood, UA: NEGATIVE
Glucose, UA: NEGATIVE
KETONES UA: NEGATIVE
LEUKOCYTES UA: NEGATIVE
Nitrite, UA: NEGATIVE
PH UA: 5.5
PROTEIN UA: NEGATIVE
Spec Grav, UA: 1.015
Urobilinogen, UA: 0.2

## 2014-06-10 MED ORDER — FLUCONAZOLE 150 MG PO TABS: 150.0000 mg | ORAL_TABLET | Freq: Once | ORAL | Status: DC

## 2014-06-10 NOTE — Patient Instructions (Signed)
  Take Diflucan 150 mg today. If symptoms not completely resolved then repeat dose in 72 hours.  Return to clinic if your symptoms are not improved within 4-5 days.

## 2014-06-10 NOTE — Progress Notes (Signed)
Pre visit review using our clinic review tool, if applicable. No additional management support is needed unless otherwise documented below in the visit note. 

## 2014-06-10 NOTE — Progress Notes (Signed)
Patient ID: Lisa Robles, female   DOB: 10-Mar-1946, 68 y.o.   MRN: [id number]   Subjective:    Patient ID: Lisa Robles, female    DOB: 10-Mar-1946, 68 y.o.   MRN: [id number]  HPI  Pt is a pleasant 68 year old female who presents to clinic with concerns that she may either have a urinary tract infection or a yeast infection. She reports a strong odor of urine. She also reports some itching around the genitalia. Denies fever, chills or discharge. She has not tried any over-the-counter medications.  Past Medical History  Diagnosis Date  . Hypercholesteremia   . Hypertension   . GERD (gastroesophageal reflux disease)     Current Outpatient Prescriptions on File Prior to Visit  Medication Sig Dispense Refill  . albuterol (PROVENTIL HFA;VENTOLIN HFA) 108 (90 BASE) MCG/ACT inhaler Inhale 2 puffs into the lungs every 6 (six) hours as needed.  6.7 g  2  . aspirin 81 MG tablet Take 81 mg by mouth daily.      . fluticasone (FLONASE) 50 MCG/ACT nasal spray Place 2 sprays into both nostrils daily.  16 g  1  . Fluticasone-Salmeterol (ADVAIR) 100-50 MCG/DOSE AEPB Inhale 1 puff into the lungs 2 (two) times daily.  60 each  2  . hydrochlorothiazide (HYDRODIURIL) 25 MG tablet Take 1 tablet (25 mg total) by mouth daily.  30 tablet  5  . rosuvastatin (CRESTOR) 10 MG tablet Take 0.5 tablets (5 mg total) by mouth daily.  15 tablet  5   No current facility-administered medications on file prior to visit.     Review of Systems  Constitutional: Negative for fever and chills.  Genitourinary: Negative for dysuria, urgency, frequency, hematuria, flank pain and vaginal discharge.       Itching of genitalia. Strong odor in her urine       Objective:  BP 122/82  Pulse 83  Temp(Src) 98.2 F (36.8 C) (Oral)  Resp 14  Wt 173 lb 12 oz (78.812 kg)  SpO2 96%   Physical Exam  Constitutional: She is oriented to person, place, and time. No distress.  Cardiovascular: Normal rate and regular rhythm.     Pulmonary/Chest: Effort normal. No respiratory distress.  Musculoskeletal: Normal range of motion.  Neurological: She is alert and oriented to person, place, and time.  Skin: Skin is warm and dry.  Psychiatric: She has a normal mood and affect. Her behavior is normal. Judgment and thought content normal.      Assessment & Plan:   1. Bad odor of urine Discussed that concentrated urine has strong odor. UA did not show evidence of UTI. No dysuria. - POCT Urinalysis Dipstick  2. Vaginal itching Diflucan 150 mg x 1. May repeat in 72 hours if symptoms not fully resolved. If no improvement within 4-5 days she will need to be re-evaluated.

## 2014-06-16 ENCOUNTER — Ambulatory Visit (INDEPENDENT_AMBULATORY_CARE_PROVIDER_SITE_OTHER): Payer: Medicare PPO | Admitting: *Deleted

## 2014-06-16 DIAGNOSIS — E538 Deficiency of other specified B group vitamins: Secondary | ICD-10-CM

## 2014-06-16 MED ORDER — CYANOCOBALAMIN 1000 MCG/ML IJ SOLN
1000.0000 ug | Freq: Once | INTRAMUSCULAR | Status: AC
  Administered 2014-06-16: 1000 ug via INTRAMUSCULAR

## 2014-07-20 ENCOUNTER — Other Ambulatory Visit: Payer: Self-pay | Admitting: *Deleted

## 2014-07-20 MED ORDER — ROSUVASTATIN CALCIUM 10 MG PO TABS: 5.0000 mg | ORAL_TABLET | Freq: Every day | ORAL | Status: DC

## 2014-07-28 ENCOUNTER — Ambulatory Visit: Payer: Medicare PPO

## 2014-07-29 ENCOUNTER — Ambulatory Visit (INDEPENDENT_AMBULATORY_CARE_PROVIDER_SITE_OTHER): Payer: Medicare PPO | Admitting: *Deleted

## 2014-07-29 DIAGNOSIS — E538 Deficiency of other specified B group vitamins: Secondary | ICD-10-CM

## 2014-07-29 DIAGNOSIS — Z23 Encounter for immunization: Secondary | ICD-10-CM

## 2014-07-29 MED ORDER — CYANOCOBALAMIN 1000 MCG/ML IJ SOLN
1000.0000 ug | Freq: Once | INTRAMUSCULAR | Status: AC
  Administered 2014-07-29: 1000 ug via INTRAMUSCULAR

## 2014-09-10 ENCOUNTER — Other Ambulatory Visit: Payer: Self-pay | Admitting: *Deleted

## 2014-09-10 MED ORDER — HYDROCHLOROTHIAZIDE 25 MG PO TABS
25.0000 mg | ORAL_TABLET | Freq: Every day | ORAL | Status: DC
Start: 2014-09-10 — End: 2015-04-07

## 2014-09-11 ENCOUNTER — Ambulatory Visit (INDEPENDENT_AMBULATORY_CARE_PROVIDER_SITE_OTHER): Payer: Medicare PPO | Admitting: *Deleted

## 2014-09-11 DIAGNOSIS — E538 Deficiency of other specified B group vitamins: Secondary | ICD-10-CM

## 2014-09-11 MED ORDER — CYANOCOBALAMIN 1000 MCG/ML IJ SOLN
1000.0000 ug | Freq: Once | INTRAMUSCULAR | Status: AC
  Administered 2014-09-11: 1000 ug via INTRAMUSCULAR

## 2014-09-15 ENCOUNTER — Ambulatory Visit: Payer: Medicare PPO | Admitting: Internal Medicine

## 2014-11-02 ENCOUNTER — Ambulatory Visit (INDEPENDENT_AMBULATORY_CARE_PROVIDER_SITE_OTHER): Payer: Medicare PPO | Admitting: Internal Medicine

## 2014-11-02 ENCOUNTER — Encounter: Payer: Self-pay | Admitting: Internal Medicine

## 2014-11-02 VITALS — BP 130/82 | HR 67 | Temp 98.4°F | Wt 169.0 lb

## 2014-11-02 DIAGNOSIS — B379 Candidiasis, unspecified: Secondary | ICD-10-CM

## 2014-11-02 DIAGNOSIS — N3289 Other specified disorders of bladder: Secondary | ICD-10-CM

## 2014-11-02 DIAGNOSIS — N390 Urinary tract infection, site not specified: Secondary | ICD-10-CM

## 2014-11-02 DIAGNOSIS — E538 Deficiency of other specified B group vitamins: Secondary | ICD-10-CM

## 2014-11-02 DIAGNOSIS — T3695XA Adverse effect of unspecified systemic antibiotic, initial encounter: Secondary | ICD-10-CM

## 2014-11-02 DIAGNOSIS — R3989 Other symptoms and signs involving the genitourinary system: Secondary | ICD-10-CM

## 2014-11-02 LAB — POCT URINALYSIS DIPSTICK
BILIRUBIN UA: NEGATIVE
GLUCOSE UA: NEGATIVE
Ketones, UA: NEGATIVE
RBC UA: NEGATIVE
SPEC GRAV UA: 1.02
Urobilinogen, UA: 0.2
pH, UA: 7

## 2014-11-02 MED ORDER — NITROFURANTOIN MONOHYD MACRO 100 MG PO CAPS: 100.0000 mg | ORAL_CAPSULE | Freq: Two times a day (BID) | ORAL | Status: DC

## 2014-11-02 MED ORDER — CYANOCOBALAMIN 1000 MCG/ML IJ SOLN
1000.0000 ug | Freq: Once | INTRAMUSCULAR | Status: AC
  Administered 2014-11-02: 1000 ug via INTRAMUSCULAR

## 2014-11-02 MED ORDER — FLUCONAZOLE 150 MG PO TABS: 150.0000 mg | ORAL_TABLET | Freq: Once | ORAL | Status: DC

## 2014-11-02 NOTE — Progress Notes (Signed)
HPI  Pt presents to the clinic today with c/o bladder pressure, strong odor to urine and dysuria. She reports this started 2 weeks ago. She denies fever, chills or low back pain. She has tried increasing her fluids and drinking cranberry juice without relief.   Review of Systems  Past Medical History  Diagnosis Date  . Hypercholesteremia   . Hypertension   . GERD (gastroesophageal reflux disease)     Family History  Problem Relation Age of Onset  . Hypercholesterolemia Brother   . Colon cancer Neg Hx   . Breast cancer Neg Hx     History   Social History  . Marital Status: Widowed    Spouse Name: N/A    Number of Children: N/A  . Years of Education: N/A   Occupational History  . retired Runner, broadcasting/film/videoteacher    Social History Main Topics  . Smoking status: Former Smoker    Quit date: 12/04/1977  . Smokeless tobacco: Never Used  . Alcohol Use: No  . Drug Use: No  . Sexual Activity: Not on file   Other Topics Concern  . Not on file   Social History Narrative    Allergies  Allergen Reactions  . Cinnamon Other (See Comments)    Mouth irritant   . Shellfish Allergy Rash    Constitutional: Denies fever, malaise, fatigue, headache or abrupt weight changes.   GU: Pt reports odor, pressure and pain with urination. Denies burning sensation, blood in urine or discharge. Skin: Denies redness, rashes, lesions or ulcercations.   No other specific complaints in a complete review of systems (except as listed in HPI above).    Objective:   Physical Exam  BP 130/82 mmHg  Pulse 67  Temp(Src) 98.4 F (36.9 C) (Oral)  Wt 169 lb (76.658 kg)  SpO2 99%   Wt Readings from Last 3 Encounters:  11/02/14 169 lb (76.658 kg)  06/10/14 173 lb 12 oz (78.812 kg)  05/14/14 179 lb 8 oz (81.421 kg)    General: Appears her stated age, well developed, well nourished in NAD. Cardiovascular: Normal rate and rhythm. S1,S2 noted.  No murmur, rubs or gallops noted.  Pulmonary/Chest: Normal effort  and positive vesicular breath sounds. No respiratory distress. No wheezes, rales or ronchi noted.  Abdomen: Soft. Normal bowel sounds, no bruits noted. No distention or masses noted. Liver, spleen and kidneys non palpable. Tender to palpation over the bladder area. No CVA tenderness.      Assessment & Plan:   Urine odor, bladder pressure and dysuria secondary to   Urinalysis: trace leuks, pos nitrites (cloudy with foul odor) Will send urine culture eRx sent if for Macrobid 100 mg BID x 5 days eRx for Diflucan for antibiotic induced yeast OK to take AZO OTC Drink plenty of fluids  RTC as needed or if symptoms persist.

## 2014-11-02 NOTE — Patient Instructions (Signed)

## 2014-11-02 NOTE — Progress Notes (Signed)
Pre visit review using our clinic review tool, if applicable. No additional management support is needed unless otherwise documented below in the visit note. 

## 2014-11-02 NOTE — Addendum Note (Signed)
Addended by: Roena MaladyEVONTENNO, Terren Haberle Y on: 11/02/2014 04:31 PM   Modules accepted: Orders

## 2014-11-03 ENCOUNTER — Ambulatory Visit: Payer: Medicare PPO

## 2014-11-04 LAB — URINE CULTURE

## 2015-01-25 ENCOUNTER — Other Ambulatory Visit: Payer: Self-pay

## 2015-01-25 DIAGNOSIS — Z1231 Encounter for screening mammogram for malignant neoplasm of breast: Secondary | ICD-10-CM

## 2015-02-02 ENCOUNTER — Ambulatory Visit (INDEPENDENT_AMBULATORY_CARE_PROVIDER_SITE_OTHER): Payer: Medicare PPO | Admitting: *Deleted

## 2015-02-02 DIAGNOSIS — E538 Deficiency of other specified B group vitamins: Secondary | ICD-10-CM

## 2015-02-02 MED ORDER — CYANOCOBALAMIN 1000 MCG/ML IJ SOLN
1000.0000 ug | Freq: Once | INTRAMUSCULAR | Status: AC
  Administered 2015-02-02: 1000 ug via INTRAMUSCULAR

## 2015-02-11 ENCOUNTER — Ambulatory Visit (INDEPENDENT_AMBULATORY_CARE_PROVIDER_SITE_OTHER): Payer: Medicare PPO | Admitting: Nurse Practitioner

## 2015-02-11 ENCOUNTER — Encounter: Payer: Self-pay | Admitting: Nurse Practitioner

## 2015-02-11 DIAGNOSIS — B379 Candidiasis, unspecified: Secondary | ICD-10-CM

## 2015-02-11 DIAGNOSIS — R300 Dysuria: Secondary | ICD-10-CM

## 2015-02-11 LAB — POCT URINALYSIS DIPSTICK
BILIRUBIN UA: NEGATIVE
Blood, UA: NEGATIVE
Glucose, UA: NEGATIVE
KETONES UA: NEGATIVE
Leukocytes, UA: NEGATIVE
Nitrite, UA: NEGATIVE
PROTEIN UA: NEGATIVE
SPEC GRAV UA: 1.015
Urobilinogen, UA: 0.2
pH, UA: 5.5

## 2015-02-11 MED ORDER — FLUCONAZOLE 150 MG PO TABS: 150.0000 mg | ORAL_TABLET | Freq: Once | ORAL | Status: DC

## 2015-02-11 NOTE — Assessment & Plan Note (Signed)
POCT urine normal.

## 2015-02-11 NOTE — Patient Instructions (Signed)
Let us know if that is not helpful!

## 2015-02-11 NOTE — Assessment & Plan Note (Signed)
Estrace side effect can be yeast infection. Asked her to try the 1 diflucan 150 mg and let us know if this is not helpful.

## 2015-02-11 NOTE — Progress Notes (Signed)
   Subjective:    Patient ID: Lisa Robles, female    DOB: January 26, 1946, 69 y.o.   MRN: [id number]  HPI  Lisa Robles is a 69 yo female with a CC of burning with urination and odor x 1 week.   1) Vaginal burning and some urinary burning. Thinks it is a UTI. Using Estrace cream once a week for 3 weeks and states it has a bad odor to it. Denies itching, but there is some possible white thick discharge- unsure if it is from the Estrace cream.   Review of Systems  Constitutional: Negative for fever, chills, diaphoresis and fatigue.  Respiratory: Negative for chest tightness, shortness of breath and wheezing.   Cardiovascular: Negative for chest pain, palpitations and leg swelling.  Gastrointestinal: Negative for nausea, vomiting and diarrhea.  Genitourinary: Positive for vaginal discharge. Negative for dysuria, urgency, frequency, hematuria, flank pain, decreased urine volume, vaginal pain and pelvic pain.  Skin: Negative for rash.  Neurological: Negative for dizziness, weakness, numbness and headaches.  Psychiatric/Behavioral: The patient is not nervous/anxious.        Objective:   Physical Exam  Constitutional: She is oriented to person, place, and time. She appears well-developed and well-nourished. No distress.  BP 118/78 mmHg  Pulse 67  Temp(Src) 98.4 F (36.9 C) (Oral)  Resp 14  Ht 5' 3.5" (1.613 m)  Wt 167 lb 6.4 oz (75.932 kg)  BMI 29.18 kg/m2  SpO2 96%   HENT:  Head: Normocephalic and atraumatic.  Right Ear: External ear normal.  Left Ear: External ear normal.  Cardiovascular: Normal rate, regular rhythm, normal heart sounds and intact distal pulses.  Exam reveals no gallop and no friction rub.   No murmur heard. Pulmonary/Chest: Effort normal and breath sounds normal. No respiratory distress. She has no wheezes. She has no rales. She exhibits no tenderness.  Neurological: She is alert and oriented to person, place, and time. No cranial nerve deficit. She exhibits  normal muscle tone. Coordination normal.  Skin: Skin is warm and dry. No rash noted. She is not diaphoretic.  Psychiatric: She has a normal mood and affect. Her behavior is normal. Judgment and thought content normal.      Assessment & Plan:

## 2015-02-15 ENCOUNTER — Ambulatory Visit: Payer: Medicare PPO

## 2015-02-24 ENCOUNTER — Ambulatory Visit
Admission: RE | Admit: 2015-02-24 | Discharge: 2015-02-24 | Disposition: A | Discharge status: Home / Self Care | Payer: Medicare PPO | Source: Ambulatory Visit

## 2015-02-24 DIAGNOSIS — Z1231 Encounter for screening mammogram for malignant neoplasm of breast: Secondary | ICD-10-CM

## 2015-03-31 ENCOUNTER — Ambulatory Visit (INDEPENDENT_AMBULATORY_CARE_PROVIDER_SITE_OTHER): Payer: Medicare PPO | Admitting: *Deleted

## 2015-03-31 DIAGNOSIS — E538 Deficiency of other specified B group vitamins: Secondary | ICD-10-CM | POA: Diagnosis not present

## 2015-03-31 MED ORDER — CYANOCOBALAMIN 1000 MCG/ML IJ SOLN
1000.0000 ug | Freq: Once | INTRAMUSCULAR | Status: AC
  Administered 2015-03-31: 1000 ug via INTRAMUSCULAR

## 2015-04-01 ENCOUNTER — Encounter: Payer: Medicare PPO | Admitting: Internal Medicine

## 2015-04-07 ENCOUNTER — Other Ambulatory Visit: Payer: Self-pay | Admitting: *Deleted

## 2015-04-07 MED ORDER — HYDROCHLOROTHIAZIDE 25 MG PO TABS: 25.0000 mg | ORAL_TABLET | Freq: Every day | ORAL | Status: DC

## 2015-04-07 NOTE — Telephone Encounter (Signed)
Appt 04/19/15

## 2015-04-14 ENCOUNTER — Other Ambulatory Visit: Payer: Medicare PPO

## 2015-04-15 ENCOUNTER — Other Ambulatory Visit (INDEPENDENT_AMBULATORY_CARE_PROVIDER_SITE_OTHER): Payer: Medicare PPO

## 2015-04-15 ENCOUNTER — Other Ambulatory Visit: Payer: Medicare PPO

## 2015-04-15 ENCOUNTER — Telehealth: Payer: Self-pay | Admitting: *Deleted

## 2015-04-15 DIAGNOSIS — D649 Anemia, unspecified: Secondary | ICD-10-CM

## 2015-04-15 DIAGNOSIS — I10 Essential (primary) hypertension: Secondary | ICD-10-CM | POA: Diagnosis not present

## 2015-04-15 DIAGNOSIS — R739 Hyperglycemia, unspecified: Secondary | ICD-10-CM

## 2015-04-15 DIAGNOSIS — E7800 Pure hypercholesterolemia, unspecified: Secondary | ICD-10-CM

## 2015-04-15 NOTE — Telephone Encounter (Signed)
Orders placed for labs

## 2015-04-15 NOTE — Telephone Encounter (Signed)
Labs and dx?  

## 2015-04-16 ENCOUNTER — Other Ambulatory Visit: Payer: Medicare PPO

## 2015-04-16 LAB — BASIC METABOLIC PANEL
BUN: 14 mg/dL (ref 6–23)
CHLORIDE: 99 meq/L (ref 96–112)
CO2: 25 mEq/L (ref 19–32)
Calcium: 10 mg/dL (ref 8.4–10.5)
Creatinine, Ser: 0.81 mg/dL (ref 0.40–1.20)
GFR: 89.67 mL/min (ref >60.00)
Glucose, Bld: 74 mg/dL (ref 70–99)
POTASSIUM: 4.7 meq/L (ref 3.5–5.1)
SODIUM: 136 meq/L (ref 135–145)

## 2015-04-16 LAB — CBC WITH DIFFERENTIAL/PLATELET
BASOS PCT: 0.3 % (ref 0.0–3.0)
Basophils Absolute: 0 10*3/uL (ref 0.0–0.1)
EOS ABS: 0.1 10*3/uL (ref 0.0–0.7)
Eosinophils Relative: 1.8 % (ref 0.0–5.0)
HCT: 47.5 % — ABNORMAL HIGH (ref 36.0–46.0)
Hemoglobin: 15.6 g/dL — ABNORMAL HIGH (ref 12.0–15.0)
LYMPHS ABS: 1.7 10*3/uL (ref 0.7–4.0)
Lymphocytes Relative: 36.9 % (ref 12.0–46.0)
MCHC: 32.9 g/dL (ref 30.0–36.0)
MCV: 83.2 fl (ref 78.0–100.0)
MONO ABS: 0.3 10*3/uL (ref 0.1–1.0)
Monocytes Relative: 6.5 % (ref 3.0–12.0)
Neutro Abs: 2.5 10*3/uL (ref 1.4–7.7)
Neutrophils Relative %: 54.5 % (ref 43.0–77.0)
PLATELETS: 216 10*3/uL (ref 150.0–400.0)
RBC: 5.71 Mil/uL — AB (ref 3.87–5.11)
RDW: 17.2 % — AB (ref 11.5–15.5)
WBC: 4.6 10*3/uL (ref 4.0–10.5)

## 2015-04-16 LAB — LIPID PANEL
Cholesterol: 227 mg/dL — ABNORMAL HIGH (ref 0–200)
HDL: 66.9 mg/dL (ref >39.00)
LDL CALC: 136 mg/dL — AB (ref 0–99)
NonHDL: 160.1
Total CHOL/HDL Ratio: 3
Triglycerides: 123 mg/dL (ref 0.0–149.0)
VLDL: 24.6 mg/dL (ref 0.0–40.0)

## 2015-04-16 LAB — HEPATIC FUNCTION PANEL
ALBUMIN: 4.1 g/dL (ref 3.5–5.2)
ALK PHOS: 66 U/L (ref 39–117)
ALT: 11 U/L (ref 0–35)
AST: 19 U/L (ref 0–37)
BILIRUBIN DIRECT: 0.1 mg/dL (ref 0.0–0.3)
BILIRUBIN TOTAL: 0.4 mg/dL (ref 0.2–1.2)
TOTAL PROTEIN: 7.8 g/dL (ref 6.0–8.3)

## 2015-04-16 LAB — HEMOGLOBIN A1C: HEMOGLOBIN A1C: 5.6 % (ref 4.6–6.5)

## 2015-04-16 LAB — TSH: TSH: 1.15 u[IU]/mL (ref 0.35–4.50)

## 2015-04-19 ENCOUNTER — Ambulatory Visit (INDEPENDENT_AMBULATORY_CARE_PROVIDER_SITE_OTHER): Payer: Medicare PPO | Admitting: Internal Medicine

## 2015-04-19 ENCOUNTER — Encounter: Payer: Self-pay | Admitting: Internal Medicine

## 2015-04-19 VITALS — BP 110/70 | HR 66 | Temp 97.9°F | Ht 62.5 in | Wt 169.4 lb

## 2015-04-19 DIAGNOSIS — I10 Essential (primary) hypertension: Secondary | ICD-10-CM | POA: Diagnosis not present

## 2015-04-19 DIAGNOSIS — E538 Deficiency of other specified B group vitamins: Secondary | ICD-10-CM | POA: Diagnosis not present

## 2015-04-19 DIAGNOSIS — R739 Hyperglycemia, unspecified: Secondary | ICD-10-CM

## 2015-04-19 DIAGNOSIS — E7800 Pure hypercholesterolemia, unspecified: Secondary | ICD-10-CM

## 2015-04-19 DIAGNOSIS — Z0000 Encounter for general adult medical examination without abnormal findings: Secondary | ICD-10-CM

## 2015-04-19 DIAGNOSIS — R87619 Unspecified abnormal cytological findings in specimens from cervix uteri: Secondary | ICD-10-CM

## 2015-04-19 DIAGNOSIS — E2839 Other primary ovarian failure: Secondary | ICD-10-CM | POA: Diagnosis not present

## 2015-04-19 DIAGNOSIS — R8290 Unspecified abnormal findings in urine: Secondary | ICD-10-CM

## 2015-04-19 DIAGNOSIS — K219 Gastro-esophageal reflux disease without esophagitis: Secondary | ICD-10-CM

## 2015-04-19 DIAGNOSIS — D649 Anemia, unspecified: Secondary | ICD-10-CM

## 2015-04-19 NOTE — Progress Notes (Signed)
Pre visit review using our clinic review tool, if applicable. No additional management support is needed unless otherwise documented below in the visit note. 

## 2015-04-19 NOTE — Progress Notes (Signed)
Patient ID: Lisa Robles, female   DOB: 1946-03-20, 69 y.o.   MRN: [id number]   Subjective:    Patient ID: Lisa Robles, female    DOB: [birth year], 69 y.o.   MRN: [id number]  HPI  Patient here to follow up on her current medical issues and for a physical exam.  Sees Dr Feliberto Gottron for her gyn examinations.  Previous persistent positive HPV.  S/p biopsy.  States ok.  Due f/u in 06/2015.  No vaginal problems reported.  Does report urine has bad odor.  Using estrace cream.  No dysuria.  Bowels stable.  Eating and drinking well.  Discussed diet and exercise.  Not exercising regularly. Cholesterol elevated some.  Has skipped some doses of cholesterol medication.     Past Medical History  Diagnosis Date  . Hypercholesteremia   . Hypertension   . GERD (gastroesophageal reflux disease)      Current Outpatient Prescriptions on File Prior to Visit  Medication Sig Dispense Refill  . albuterol (PROVENTIL HFA;VENTOLIN HFA) 108 (90 BASE) MCG/ACT inhaler Inhale 2 puffs into the lungs every 6 (six) hours as needed. 6.7 g 2  . aspirin 81 MG tablet Take 81 mg by mouth daily.    Marland Kitchen estradiol (ESTRACE) 0.1 MG/GM vaginal cream Place 1 Applicatorful vaginally once a week.    . fluticasone (FLONASE) 50 MCG/ACT nasal spray Place 2 sprays into both nostrils daily. 16 g 1  . Fluticasone-Salmeterol (ADVAIR) 100-50 MCG/DOSE AEPB Inhale 1 puff into the lungs 2 (two) times daily. 60 each 2  . hydrochlorothiazide (HYDRODIURIL) 25 MG tablet Take 1 tablet (25 mg total) by mouth daily. 30 tablet 0  . rosuvastatin (CRESTOR) 10 MG tablet Take 0.5 tablets (5 mg total) by mouth daily. 15 tablet 5   No current facility-administered medications on file prior to visit.    Review of Systems  Constitutional: Negative for appetite change and unexpected weight change.  HENT: Negative for congestion and sinus pressure.   Eyes: Negative for pain and visual disturbance.  Respiratory: Negative for cough, chest tightness  and shortness of breath.   Cardiovascular: Negative for chest pain, palpitations and leg swelling.  Gastrointestinal: Negative for nausea, vomiting, abdominal pain and diarrhea.  Genitourinary: Negative for dysuria, vaginal discharge and difficulty urinating.       Bad odor - urine.   Musculoskeletal: Negative for back pain and joint swelling.  Skin: Negative for color change and rash.  Neurological: Negative for dizziness, light-headedness and headaches.  Hematological: Negative for adenopathy. Does not bruise/bleed easily.  Psychiatric/Behavioral: Negative for dysphoric mood and agitation.       Objective:     Blood pressure recheck:  124/78  Physical Exam  Constitutional: She is oriented to person, place, and time. She appears well-developed and well-nourished.  HENT:  Nose: Nose normal.  Mouth/Throat: Oropharynx is clear and moist.  Eyes: Right eye exhibits no discharge. Left eye exhibits no discharge. No scleral icterus.  Neck: Neck supple. No thyromegaly present.  Cardiovascular: Normal rate and regular rhythm.   Pulmonary/Chest: Breath sounds normal. No accessory muscle usage. No tachypnea. No respiratory distress. She has no decreased breath sounds. She has no wheezes. She has no rhonchi. Right breast exhibits no inverted nipple, no mass, no nipple discharge and no tenderness (no axillary adenopathy). Left breast exhibits no inverted nipple, no mass, no nipple discharge and no tenderness (no axilarry adenopathy).  Abdominal: Soft. Bowel sounds are normal. There is no tenderness.  Musculoskeletal: She exhibits no edema or  tenderness.  Lymphadenopathy:    She has no cervical adenopathy.  Neurological: She is alert and oriented to person, place, and time.  Skin: Skin is warm. No rash noted.  Psychiatric: She has a normal mood and affect. Her behavior is normal.    BP 110/70 mmHg  Pulse 66  Temp(Src) 97.9 F (36.6 C) (Oral)  Ht 5' 2.5" (1.588 m)  Wt 169 lb 6 oz (76.828 kg)   BMI 30.47 kg/m2  SpO2 97% Wt Readings from Last 3 Encounters:  04/19/15 169 lb 6 oz (76.828 kg)  02/11/15 167 lb 6.4 oz (75.932 kg)  11/02/14 169 lb (76.658 kg)     Lab Results  Component Value Date   WBC 4.6 04/15/2015   HGB 15.6* 04/15/2015   HCT 47.5* 04/15/2015   PLT 216.0 04/15/2015   GLUCOSE 74 04/15/2015   CHOL 227* 04/15/2015   TRIG 123.0 04/15/2015   HDL 66.90 04/15/2015   LDLDIRECT 142.7 12/11/2013   LDLCALC 136* 04/15/2015   ALT 11 04/15/2015   AST 19 04/15/2015   NA 136 04/15/2015   K 4.7 04/15/2015   CL 99 04/15/2015   CREATININE 0.81 04/15/2015   BUN 14 04/15/2015   CO2 25 04/15/2015   TSH 1.15 04/15/2015   HGBA1C 5.6 04/15/2015   MICROALBUR 0.2 12/11/2013    Mm Screening Breast Tomo Bilateral  02/24/2015   CLINICAL DATA:  Screening.  EXAM: DIGITAL SCREENING BILATERAL MAMMOGRAM WITH 3D TOMO WITH CAD  COMPARISON:  Previous exam(s).  ACR Breast Density Category b: There are scattered areas of fibroglandular density.  FINDINGS: There are no findings suspicious for malignancy. Images were processed with CAD.  IMPRESSION: No mammographic evidence of malignancy. A result letter of this screening mammogram will be mailed directly to the patient.  RECOMMENDATION: Screening mammogram in one year. (Code:SM-B-01Y)  BI-RADS CATEGORY  1: Negative.   Electronically Signed   By: Christiana PellantGretchen  Green M.D.   On: 02/24/2015 10:56       Assessment & Plan:   Problem List Items Addressed This Visit    Abnormal Pap smear of cervix    Seeing Dr Feliberto GottronSchermerhorn.  Biopsy per her report - negative.  Due f/u in 06/2015.       Anemia    Had EGD and colonoscopy 2014.  Continue b12 injections.  Follow cbc.        B12 deficiency    Continue B12 injections.       Relevant Orders   CBC with Differential/Platelet   GERD (gastroesophageal reflux disease)    No upper symptoms reported.        Health care maintenance    Physical 04/19/15.  PAP followed through gyn.  Mammogram 02/24/15 -  Birads I.  Colonoscopy 2014.   Agreeable to bone density.       Hypercholesterolemia    On crestor.  Has missed some doses.  Low cholesterol diet and exercise.  LDL increased.  Take medication regularly.  Follow lipid panel and liver function tests.       Relevant Orders   Lipid panel   Hepatic function panel   Hyperglycemia    Low carb diet and exercise.  A1c just checked - 5.6.  Follow.       Relevant Orders   Hemoglobin A1c   Hypertension    Blood pressure doing well.  Same medication regimen.  Follow pressures.  Follow metabolic panel.  Recent Cr .81.       Relevant Orders   Basic metabolic  panel    Other Visit Diagnoses    Bad odor of urine    -  Primary    Relevant Orders    Urinalysis, Routine w reflex microscopic    CULTURE, URINE COMPREHENSIVE    Estrogen deficiency        Relevant Orders    DG Bone Density      I spent 25 minutes with the patient and more than 50% of the time was spent in consultation regarding the above.     Dale DurhamSCOTT, Saksham Akkerman, MD

## 2015-04-20 ENCOUNTER — Encounter: Payer: Self-pay | Admitting: Internal Medicine

## 2015-04-20 DIAGNOSIS — Z0000 Encounter for general adult medical examination without abnormal findings: Secondary | ICD-10-CM | POA: Insufficient documentation

## 2015-04-20 LAB — URINALYSIS, ROUTINE W REFLEX MICROSCOPIC
Bilirubin Urine: NEGATIVE
Hgb urine dipstick: NEGATIVE
Ketones, ur: NEGATIVE
Leukocytes, UA: NEGATIVE
NITRITE: POSITIVE — AB
RBC / HPF: NONE SEEN (ref 0–?)
SPECIFIC GRAVITY, URINE: 1.02 (ref 1.000–1.030)
Total Protein, Urine: NEGATIVE
Urine Glucose: NEGATIVE
Urobilinogen, UA: 0.2 (ref 0.0–1.0)
pH: 5.5 (ref 5.0–8.0)

## 2015-04-20 NOTE — Assessment & Plan Note (Signed)
On crestor.  Has missed some doses.  Low cholesterol diet and exercise.  LDL increased.  Take medication regularly.  Follow lipid panel and liver function tests.

## 2015-04-20 NOTE — Assessment & Plan Note (Signed)
Low carb diet and exercise.  A1c just checked - 5.6.  Follow.

## 2015-04-20 NOTE — Assessment & Plan Note (Signed)
Had EGD and colonoscopy 2014.  Continue b12 injections.  Follow cbc.

## 2015-04-20 NOTE — Assessment & Plan Note (Signed)
Seeing Dr Feliberto GottronSchermerhorn.  Biopsy per her report - negative.  Due f/u in 06/2015.

## 2015-04-20 NOTE — Assessment & Plan Note (Addendum)
Physical 04/19/15.  PAP followed through gyn.  Mammogram 02/24/15 - Birads I.  Colonoscopy 2014.   Agreeable to bone density.

## 2015-04-20 NOTE — Assessment & Plan Note (Signed)
Continue B12 injections.   

## 2015-04-20 NOTE — Assessment & Plan Note (Signed)
No upper symptoms reported.   

## 2015-04-20 NOTE — Assessment & Plan Note (Signed)
Blood pressure doing well.  Same medication regimen.  Follow pressures.  Follow metabolic panel.  Recent Cr .81.

## 2015-04-23 ENCOUNTER — Other Ambulatory Visit: Payer: Self-pay | Admitting: *Deleted

## 2015-04-23 LAB — CULTURE, URINE COMPREHENSIVE

## 2015-04-23 MED ORDER — CEPHALEXIN 500 MG PO CAPS: 500.0000 mg | ORAL_CAPSULE | Freq: Three times a day (TID) | ORAL | Status: DC

## 2015-04-23 NOTE — Progress Notes (Signed)
See result note.  

## 2015-05-11 ENCOUNTER — Ambulatory Visit (INDEPENDENT_AMBULATORY_CARE_PROVIDER_SITE_OTHER): Payer: Medicare PPO | Admitting: *Deleted

## 2015-05-11 DIAGNOSIS — E538 Deficiency of other specified B group vitamins: Secondary | ICD-10-CM | POA: Diagnosis not present

## 2015-05-11 MED ORDER — CYANOCOBALAMIN 1000 MCG/ML IJ SOLN
1000.0000 ug | Freq: Once | INTRAMUSCULAR | Status: AC
  Administered 2015-05-11: 1000 ug via INTRAMUSCULAR

## 2015-05-12 ENCOUNTER — Ambulatory Visit
Admission: RE | Admit: 2015-05-12 | Discharge: 2015-05-12 | Disposition: A | Discharge status: Home / Self Care | Payer: Medicare PPO | Source: Ambulatory Visit | Attending: Internal Medicine | Admitting: Internal Medicine

## 2015-05-12 DIAGNOSIS — E2839 Other primary ovarian failure: Secondary | ICD-10-CM

## 2015-05-12 DIAGNOSIS — M8580 Other specified disorders of bone density and structure, unspecified site: Secondary | ICD-10-CM | POA: Diagnosis not present

## 2015-05-13 ENCOUNTER — Encounter: Payer: Self-pay | Admitting: *Deleted

## 2015-05-18 ENCOUNTER — Other Ambulatory Visit: Payer: Self-pay | Admitting: *Deleted

## 2015-05-18 MED ORDER — HYDROCHLOROTHIAZIDE 25 MG PO TABS: 25.0000 mg | ORAL_TABLET | Freq: Every day | ORAL | Status: DC

## 2015-05-19 ENCOUNTER — Other Ambulatory Visit: Payer: Self-pay | Admitting: *Deleted

## 2015-05-19 MED ORDER — ROSUVASTATIN CALCIUM 10 MG PO TABS: 5.0000 mg | ORAL_TABLET | Freq: Every day | ORAL | Status: DC

## 2015-06-14 ENCOUNTER — Encounter: Payer: Self-pay | Admitting: Nurse Practitioner

## 2015-06-14 ENCOUNTER — Ambulatory Visit (INDEPENDENT_AMBULATORY_CARE_PROVIDER_SITE_OTHER): Payer: Medicare PPO | Admitting: Nurse Practitioner

## 2015-06-14 VITALS — BP 120/84 | HR 64 | Temp 98.6°F | Resp 16 | Ht 62.5 in | Wt 172.0 lb

## 2015-06-14 DIAGNOSIS — R300 Dysuria: Secondary | ICD-10-CM

## 2015-06-14 LAB — POCT URINALYSIS DIPSTICK
Bilirubin, UA: NEGATIVE
Blood, UA: NEGATIVE
Glucose, UA: NEGATIVE
KETONES UA: NEGATIVE
LEUKOCYTES UA: NEGATIVE
Nitrite, UA: NEGATIVE
PH UA: 7.5
PROTEIN UA: NEGATIVE
SPEC GRAV UA: 1.015
Urobilinogen, UA: 1

## 2015-06-14 NOTE — Assessment & Plan Note (Signed)
POCT not convincing for infection. Will obtain culture. No tx at this time. Asked her to talk to Dr. Feliberto GottronSchermerhorn about alternatives for estrace. Suggested trying OTC monistat for irritation. FU prn worsening/failure to improve.

## 2015-06-14 NOTE — Progress Notes (Signed)
Pre visit review using our clinic review tool, if applicable. No additional management support is needed unless otherwise documented below in the visit note. 

## 2015-06-14 NOTE — Patient Instructions (Signed)
We will call you with results. Please talk with your OB/GYN about vaginal irritation.

## 2015-06-14 NOTE — Progress Notes (Signed)
   Subjective:    Patient ID: Lisa Robles, female    DOB: 1946/09/06, 69 y.o.   MRN: [id number]  HPI  Lisa Robles is a 69 yo female with a CC of UTI 2 weeks.   1) UTI symptoms- Odor, darker in the morning, uncomfortable when sitting, dysuria, felt better after treatment in May, but never completely got over it she reports. Denies itching or discharge.   Using for 6 months Estrace. 1 applicator weekly per Dr. Feliberto GottronSchermerhorn. Asking questions about this being possible irritation factor.   Review of Systems  Constitutional: Negative for fever, chills, diaphoresis and fatigue.  Gastrointestinal: Negative for nausea, vomiting and diarrhea.  Genitourinary: Positive for dysuria. Negative for urgency and flank pain.  Skin: Negative for rash.  Neurological: Negative for dizziness, weakness, numbness and headaches.  Psychiatric/Behavioral: The patient is not nervous/anxious.       Objective:   Physical Exam  Constitutional: She is oriented to person, place, and time. She appears well-developed and well-nourished. No distress.  BP 120/84 mmHg  Pulse 64  Temp(Src) 98.6 F (37 C)  Resp 16  Ht 5' 2.5" (1.588 m)  Wt 172 lb (78.019 kg)  BMI 30.94 kg/m2  SpO2 97%   HENT:  Head: Normocephalic and atraumatic.  Right Ear: External ear normal.  Left Ear: External ear normal.  Abdominal: There is no CVA tenderness.  Neurological: She is alert and oriented to person, place, and time. No cranial nerve deficit. She exhibits normal muscle tone. Coordination normal.  Skin: Skin is warm and dry. No rash noted. She is not diaphoretic.  Psychiatric: She has a normal mood and affect. Her behavior is normal. Judgment and thought content normal.      Assessment & Plan:

## 2015-06-16 LAB — URINE CULTURE: Colony Count: 60000

## 2015-07-15 ENCOUNTER — Ambulatory Visit (INDEPENDENT_AMBULATORY_CARE_PROVIDER_SITE_OTHER): Payer: Medicare PPO

## 2015-07-15 DIAGNOSIS — E538 Deficiency of other specified B group vitamins: Secondary | ICD-10-CM

## 2015-07-15 MED ORDER — CYANOCOBALAMIN 1000 MCG/ML IJ SOLN
1000.0000 ug | Freq: Once | INTRAMUSCULAR | Status: AC
  Administered 2015-07-15: 1000 ug via INTRAMUSCULAR

## 2015-09-08 ENCOUNTER — Other Ambulatory Visit: Payer: Self-pay | Admitting: Internal Medicine

## 2015-09-16 ENCOUNTER — Ambulatory Visit: Payer: Medicare PPO

## 2015-09-16 ENCOUNTER — Ambulatory Visit (INDEPENDENT_AMBULATORY_CARE_PROVIDER_SITE_OTHER): Payer: Medicare PPO | Admitting: *Deleted

## 2015-09-16 ENCOUNTER — Telehealth: Payer: Self-pay | Admitting: *Deleted

## 2015-09-16 DIAGNOSIS — E538 Deficiency of other specified B group vitamins: Secondary | ICD-10-CM

## 2015-09-16 MED ORDER — CYANOCOBALAMIN 1000 MCG/ML IJ SOLN
1000.0000 ug | Freq: Once | INTRAMUSCULAR | Status: AC
  Administered 2015-09-16: 1000 ug via INTRAMUSCULAR

## 2015-09-16 NOTE — Telephone Encounter (Signed)
Patient stated that she has received to the pneumonia vaccine, however you have questions about the vaccine that protect older people from pneumonia.

## 2015-09-16 NOTE — Telephone Encounter (Signed)
Attempted to call patient, left message.  

## 2015-09-17 NOTE — Telephone Encounter (Signed)
Pt called back to return your call.. Please advise pt

## 2015-09-17 NOTE — Telephone Encounter (Signed)
Spoke with patient, wanted to clarify that she needs the additional Pneumonia vaccine (Ie. Prevnar 13) She will get it at her pharmacy and then send the records to us to update her records.

## 2015-11-20 ENCOUNTER — Other Ambulatory Visit: Payer: Self-pay | Admitting: Internal Medicine

## 2015-12-31 ENCOUNTER — Telehealth: Payer: Self-pay | Admitting: Internal Medicine

## 2015-12-31 NOTE — Telephone Encounter (Signed)
Left msg to call office to schedule AWV/msn °

## 2016-01-11 ENCOUNTER — Other Ambulatory Visit (INDEPENDENT_AMBULATORY_CARE_PROVIDER_SITE_OTHER): Payer: Medicare Other

## 2016-01-11 DIAGNOSIS — R739 Hyperglycemia, unspecified: Secondary | ICD-10-CM | POA: Diagnosis not present

## 2016-01-11 DIAGNOSIS — E538 Deficiency of other specified B group vitamins: Secondary | ICD-10-CM

## 2016-01-11 DIAGNOSIS — I10 Essential (primary) hypertension: Secondary | ICD-10-CM | POA: Diagnosis not present

## 2016-01-11 DIAGNOSIS — E7800 Pure hypercholesterolemia, unspecified: Secondary | ICD-10-CM | POA: Diagnosis not present

## 2016-01-11 LAB — CBC WITH DIFFERENTIAL/PLATELET
BASOS PCT: 0.5 % (ref 0.0–3.0)
Basophils Absolute: 0 10*3/uL (ref 0.0–0.1)
EOS PCT: 1.5 % (ref 0.0–5.0)
Eosinophils Absolute: 0.1 10*3/uL (ref 0.0–0.7)
HEMATOCRIT: 40.3 % (ref 36.0–46.0)
HEMOGLOBIN: 13.4 g/dL (ref 12.0–15.0)
Lymphocytes Relative: 43.8 % (ref 12.0–46.0)
Lymphs Abs: 3.3 10*3/uL (ref 0.7–4.0)
MCHC: 33.1 g/dL (ref 30.0–36.0)
MCV: 92.5 fl (ref 78.0–100.0)
MONO ABS: 0.8 10*3/uL (ref 0.1–1.0)
Monocytes Relative: 10 % (ref 3.0–12.0)
NEUTROS ABS: 3.3 10*3/uL (ref 1.4–7.7)
Neutrophils Relative %: 44.2 % (ref 43.0–77.0)
PLATELETS: 311 10*3/uL (ref 150.0–400.0)
RBC: 4.36 Mil/uL (ref 3.87–5.11)
RDW: 13.9 % (ref 11.5–15.5)
WBC: 7.6 10*3/uL (ref 4.0–10.5)

## 2016-01-11 LAB — HEPATIC FUNCTION PANEL
ALBUMIN: 4.2 g/dL (ref 3.5–5.2)
ALT: 13 U/L (ref 0–35)
AST: 18 U/L (ref 0–37)
Alkaline Phosphatase: 61 U/L (ref 39–117)
BILIRUBIN DIRECT: 0.1 mg/dL (ref 0.0–0.3)
BILIRUBIN TOTAL: 0.5 mg/dL (ref 0.2–1.2)
Total Protein: 7.6 g/dL (ref 6.0–8.3)

## 2016-01-11 LAB — BASIC METABOLIC PANEL
BUN: 16 mg/dL (ref 6–23)
CALCIUM: 9.9 mg/dL (ref 8.4–10.5)
CO2: 32 meq/L (ref 19–32)
CREATININE: 0.72 mg/dL (ref 0.40–1.20)
Chloride: 101 mEq/L (ref 96–112)
GFR: 102.51 mL/min (ref >60.00)
GLUCOSE: 103 mg/dL — AB (ref 70–99)
Potassium: 3.7 mEq/L (ref 3.5–5.1)
Sodium: 137 mEq/L (ref 135–145)

## 2016-01-11 LAB — LIPID PANEL
CHOL/HDL RATIO: 4
CHOLESTEROL: 211 mg/dL — AB (ref 0–200)
HDL: 58.6 mg/dL (ref >39.00)
LDL CALC: 125 mg/dL — AB (ref 0–99)
NonHDL: 152.89
Triglycerides: 139 mg/dL (ref 0.0–149.0)
VLDL: 27.8 mg/dL (ref 0.0–40.0)

## 2016-01-11 LAB — HEMOGLOBIN A1C: Hgb A1c MFr Bld: 6 % (ref 4.6–6.5)

## 2016-01-13 ENCOUNTER — Ambulatory Visit: Payer: Medicare PPO

## 2016-01-19 ENCOUNTER — Ambulatory Visit (INDEPENDENT_AMBULATORY_CARE_PROVIDER_SITE_OTHER): Payer: Medicare Other

## 2016-01-19 VITALS — BP 116/76 | HR 78 | Temp 97.1°F | Resp 12 | Ht 62.5 in | Wt 178.4 lb

## 2016-01-19 DIAGNOSIS — E538 Deficiency of other specified B group vitamins: Secondary | ICD-10-CM | POA: Diagnosis not present

## 2016-01-19 DIAGNOSIS — Z1159 Encounter for screening for other viral diseases: Secondary | ICD-10-CM

## 2016-01-19 DIAGNOSIS — Z0000 Encounter for general adult medical examination without abnormal findings: Secondary | ICD-10-CM | POA: Diagnosis not present

## 2016-01-19 MED ORDER — CYANOCOBALAMIN 1000 MCG/ML IJ SOLN
1000.0000 ug | Freq: Once | INTRAMUSCULAR | Status: AC
  Administered 2016-01-19: 1000 ug via INTRAMUSCULAR

## 2016-01-19 NOTE — Progress Notes (Signed)
Subjective:   Lisa Robles is a 70 y.o. female who presents for an Initial Medicare Annual Wellness Visit.  Review of Systems    No ROS.  Medicare Wellness Visit.  Cardiac Risk Factors include: advanced age (>28men, >32 women);hypertension     Objective:    Today's Vitals   01/19/16 0815 01/19/16 0818  BP: 116/76   Pulse: 78   Temp: 97.1 F (36.2 C)   TempSrc: Oral   Resp: 12   Height: 5' 2.5" (1.588 m)   Weight: 178 lb 6.4 oz (80.922 kg)   SpO2: 96%   PainSc:  4     Current Medications (verified) Outpatient Encounter Prescriptions as of 01/19/2016  Medication Sig  . ADVAIR DISKUS 100-50 MCG/DOSE AEPB inhale 1 dose by mouth twice a day  . albuterol (PROVENTIL HFA;VENTOLIN HFA) 108 (90 BASE) MCG/ACT inhaler Inhale 2 puffs into the lungs every 6 (six) hours as needed.  Marland Kitchen aspirin 81 MG tablet Take 81 mg by mouth daily.  Marland Kitchen estradiol (ESTRACE) 0.1 MG/GM vaginal cream Place 1 Applicatorful vaginally once a week.  . fluticasone (FLONASE) 50 MCG/ACT nasal spray Place 2 sprays into both nostrils daily.  . hydrochlorothiazide (HYDRODIURIL) 25 MG tablet Take 1 tablet (25 mg total) by mouth daily.  Marland Kitchen PROVENTIL HFA 108 (90 BASE) MCG/ACT inhaler inhale 2 puffs by mouth every 6 hours if needed  . rosuvastatin (CRESTOR) 10 MG tablet Take 0.5 tablets (5 mg total) by mouth daily.  . [DISCONTINUED] cephALEXin (KEFLEX) 500 MG capsule Take 1 capsule (500 mg total) by mouth 3 (three) times daily.  . [EXPIRED] cyanocobalamin ((VITAMIN B-12)) injection 1,000 mcg    No facility-administered encounter medications on file as of 01/19/2016.    Allergies (verified) Cinnamon and Shellfish allergy   History: Past Medical History  Diagnosis Date  . Hypercholesteremia   . Hypertension   . GERD (gastroesophageal reflux disease)    Past Surgical History  Procedure Laterality Date  . Tubal ligation  1979  . Tooth extraction      tooth implantation   Family History  Problem Relation Age  of Onset  . Hypercholesterolemia Brother   . Colon cancer Neg Hx   . Breast cancer Neg Hx    Social History   Occupational History  . retired Runner, broadcasting/film/video    Social History Main Topics  . Smoking status: Former Smoker    Quit date: 12/04/1977  . Smokeless tobacco: Never Used  . Alcohol Use: No  . Drug Use: No  . Sexual Activity: Yes    Tobacco Counseling Counseling given: Not Answered   Activities of Daily Living In your present state of health, do you have any difficulty performing the following activities: 01/19/2016  Hearing? N  Vision? N  Difficulty concentrating or making decisions? N  Walking or climbing stairs? N  Dressing or bathing? N  Doing errands, shopping? N  Preparing Food and eating ? N  Using the Toilet? N  In the past six months, have you accidently leaked urine? N  Do you have problems with loss of bowel control? N  Managing your Medications? N  Managing your Finances? N  Housekeeping or managing your Housekeeping? N    Immunizations and Health Maintenance Immunization History  Administered Date(s) Administered  . Influenza,inj,Quad PF,36+ Mos 07/29/2014  . Influenza-Unspecified 09/01/2013, 09/09/2015  . Pneumococcal Conjugate-13 12/01/2015  . Pneumococcal Polysaccharide-23 07/18/2011  . Tdap 05/19/2011   There are no preventive care reminders to display for this patient.  Patient Care Team: Dale Bairdford, MD as PCP - General (Internal Medicine)  Indicate any recent Medical Services you may have received from other than [facility] providers in the past year (date may be approximate).     Assessment:   This is a routine wellness examination for [name]. The goal of the wellness visit is to assist the patient how to close the gaps in care and create a preventative care plan for the patient.   Osteoporosis risk reviewed.  Medications reviewed; taking without issues or barriers.  Safety issues reviewed; smoke detectors in the home. Carbon  monoxide detector in the home. Firearms locked in a secure area in the home. Wears seatbelts when driving or riding with others. No violence in the home.  No identified risk were noted; The patient was oriented x 3; appropriate in dress and manner and no objective failures at ADL's or IADL's.   Patient Concerns:  R side neck/ear/head pain with congestion; onset 3 weeks ago.  Currently being seen by [facility] ENT and has recently finished taking an antibiotic. Reports still having some pain and congestion.  Declines follow up with PCP at this time.  CPE; appointment scheduled.  B12 injection; administered today, PCP aware.    Hearing/Vision screen Hearing Screening Comments: Passes the whisper test Vision Screening Comments: Followed by Dr. Deboraha Sprang Wears glasses  Annual visits  Dietary issues and exercise activities discussed: Current Exercise Habits:: Home exercise routine, Type of exercise: walking, Time (Minutes): 20, Frequency (Times/Week): 4, Weekly Exercise (Minutes/Week): 80, Intensity: Mild  Goals    . increase physical activity     Start water aerobics in the spring Use fitbit to track 10,000 steps when walking       Depression Screen PHQ 2/9 Scores 01/19/2016 04/19/2015 12/16/2013 02/10/2013 12/08/2012  PHQ - 2 Score 0 0 0 0 0    Fall Risk Fall Risk  01/19/2016 04/19/2015 12/16/2013 02/10/2013 12/08/2012  Falls in the past year? No No No No No    Cognitive Function: MMSE - Mini Mental State Exam 01/19/2016  Orientation to time 5  Orientation to Place 5  Registration 3  Attention/ Calculation 5  Recall 3  Language- name 2 objects 2  Language- repeat 1  Language- follow 3 step command 3  Language- read & follow direction 1  Write a sentence 1  Copy design 1  Total score 30    Screening Tests Health Maintenance  Topic Date Due  . COLONOSCOPY  04/24/2016  . INFLUENZA VACCINE  07/04/2016  . MAMMOGRAM  02/23/2017  . TETANUS/TDAP  05/18/2021  . DEXA SCAN  Completed  .  ZOSTAVAX  Addressed  . Hepatitis C Screening  Completed  . PNA vac Low Risk Adult  Completed      Plan:   End of life planning; Advance aging; Advanced directives discussed. Educational material provided to help start the conversation with her family. Return HCPOA/Living Will short forms upon completion.  Follow up with PCP as needed.    During the course of the visit, Tashala was educated and counseled about the following appropriate screening and preventive services:   Vaccines to include Pneumoccal, Influenza, Hepatitis B, Td, Zostavax, HCV  Electrocardiogram  Cardiovascular disease screening  Colorectal cancer screening  Bone density screening  Diabetes screening  Glaucoma screening  Mammography/PAP  Nutrition counseling  Smoking cessation counseling  Patient Instructions (the written plan) were given to the patient.    Ashok Pall, LPN   0/[id number]/1191     Reviewed above  information.  Agree with plan.  She is seeing ENT.  Declined f/u here.    Dr Lorin Picket

## 2016-01-19 NOTE — Patient Instructions (Addendum)
  [name] , Thank you for taking time to come for your Medicare Wellness Visit. I appreciate your ongoing commitment to your health goals. Please review the following plan we discussed and let me know if I can assist you in the future.   Return in May for physical with Dr. Lorin Picket  Make annual eye exam   This is a list of the screening recommended for you and due dates:  Health Maintenance  Topic Date Due  . Colon Cancer Screening  04/24/2016  . Flu Shot  07/04/2016  . Mammogram  02/23/2017  . Tetanus Vaccine  05/18/2021  . DEXA scan (bone density measurement)  Completed  . Shingles Vaccine  Addressed  .  Hepatitis C: One time screening is recommended by Center for Disease Control  (CDC) for  adults born from [birth year] through 1967.   Completed  . Pneumonia vaccines  Completed

## 2016-01-20 LAB — HEPATITIS C ANTIBODY: HCV Ab: NEGATIVE

## 2016-01-27 ENCOUNTER — Other Ambulatory Visit: Payer: Self-pay

## 2016-01-27 DIAGNOSIS — Z1231 Encounter for screening mammogram for malignant neoplasm of breast: Secondary | ICD-10-CM

## 2016-02-05 ENCOUNTER — Other Ambulatory Visit: Payer: Self-pay | Admitting: Internal Medicine

## 2016-02-10 ENCOUNTER — Ambulatory Visit (INDEPENDENT_AMBULATORY_CARE_PROVIDER_SITE_OTHER): Payer: Medicare Other | Admitting: Internal Medicine

## 2016-02-10 ENCOUNTER — Encounter: Payer: Self-pay | Admitting: Internal Medicine

## 2016-02-10 VITALS — BP 124/76 | HR 81 | Temp 98.6°F | Wt 182.0 lb

## 2016-02-10 DIAGNOSIS — J029 Acute pharyngitis, unspecified: Secondary | ICD-10-CM

## 2016-02-10 DIAGNOSIS — B370 Candidal stomatitis: Secondary | ICD-10-CM | POA: Diagnosis not present

## 2016-02-10 DIAGNOSIS — H9201 Otalgia, right ear: Secondary | ICD-10-CM

## 2016-02-10 DIAGNOSIS — H6981 Other specified disorders of Eustachian tube, right ear: Secondary | ICD-10-CM | POA: Diagnosis not present

## 2016-02-10 LAB — POCT RAPID STREP A (OFFICE): RAPID STREP A SCREEN: NEGATIVE

## 2016-02-10 MED ORDER — NYSTATIN 100000 UNIT/ML MT SUSP: 5.0000 mL | Freq: Four times a day (QID) | OROMUCOSAL | Status: DC

## 2016-02-10 NOTE — Progress Notes (Signed)
HPI  Pt presents to the clinic today with c/o right ear pain and fullness. This has been going on for a few months. She reports she was seen by ENT 12/2015, and treated for an ear infection. She reports her symptoms improved but did not fully resolve. She denies decreased hearing out of the right ear.  She also c/o a burning sensation of her tongue. This started 2 weeks ago. She has had a sore throat but denies difficulty swallowing. She denies runny nose but has had a slight cough. She denies fever, chills or body aches. She has not had a fever. She has no history of allergies or breathing problems. She has had sick contacts diagnosed with strep.  She has Advair but reports she does not use it. She has not been on abx or Prednisone since 12/2015.                           Review of Systems      Past Medical History  Diagnosis Date  . Hypercholesteremia   . Hypertension   . GERD (gastroesophageal reflux disease)     Family History  Problem Relation Age of Onset  . Hypercholesterolemia Brother   . Colon cancer Neg Hx   . Breast cancer Neg Hx     Social History   Social History  . Marital Status: Widowed    Spouse Name: N/A  . Number of Children: N/A  . Years of Education: N/A   Occupational History  . retired Runner, broadcasting/film/video    Social History Main Topics  . Smoking status: Former Smoker    Quit date: 12/04/1977  . Smokeless tobacco: Never Used  . Alcohol Use: No  . Drug Use: No  . Sexual Activity: Yes   Other Topics Concern  . Not on file   Social History Narrative    Allergies  Allergen Reactions  . Cinnamon Other (See Comments)    Mouth irritant   . Shellfish Allergy Rash     Constitutional:  Denies headache, fatigue, fever or abrupt weight changes.  HEENT:  Positive ear pain, sore throat. Denies eye redness, eye pain, pressure behind the eyes, facial pain, nasal congestion, ringing in the ears, wax buildup, runny nose or bloody nose. Respiratory: Denies cough,  difficulty breathing or shortness of breath.  Cardiovascular: Denies chest pain, chest tightness, palpitations or swelling in the hands or feet.   No other specific complaints in a complete review of systems (except as listed in HPI above).  Objective:   BP 124/76 mmHg  Pulse 81  Temp(Src) 98.6 F (37 C) (Oral)  Wt 182 lb (82.555 kg)  SpO2 98% Wt Readings from Last 3 Encounters:  02/10/16 182 lb (82.555 kg)  01/19/16 178 lb 6.4 oz (80.922 kg)  06/14/15 172 lb (78.019 kg)     General: Appears her stated age,  in NAD. HEENT: Head: normal shape and size, no sinus tenderness noted; Eyes: sclera white, no icterus, conjunctiva pink; Ears: Tm's gray and intact, normal light reflex, + serous effusion bilaterally; Throat/Mouth: Teeth present, mucosa erythematous and moist, no exudate noted, no lesions or ulcerations noted. White coating noted on her tongue. Neck: No cervical lymphadenopathy.  Cardiovascular: Normal rate and rhythm. S1,S2 noted.  No murmur, rubs or gallops noted.  Pulmonary/Chest: Normal effort and positive vesicular breath sounds. No respiratory distress. No wheezes, rales or ronchi noted.      Assessment & Plan:   Otalgia, right ear:  ETD Start Flonase 1 spray daily  Sore throat:  RST: negative Looks like thrush eRx for Nystatin swish and swallow  RTC as needed or if symptoms persist.

## 2016-02-10 NOTE — Addendum Note (Signed)
Addended by: Roena MaladyEVONTENNO, Dontreal Miera Y on: 02/10/2016 03:26 PM   Modules accepted: Orders

## 2016-02-10 NOTE — Patient Instructions (Signed)
Thrush, Adult  Thrush, also called oral candidiasis, is a fungal infection that develops in the mouth and throat and on the tongue. It causes white patches to form on the mouth and tongue. Thrush is most common in older adults, but it can occur at any age.   Many cases of thrush are mild, but this infection can also be more serious. Thrush can be a recurring problem for people who have chronic illnesses or who take medicines that limit the body's ability to fight infection. Because these people have difficulty fighting infections, the fungus that causes thrush can spread throughout the body. This can cause life-threatening blood or organ infections.  CAUSES   Thrush is usually caused by a yeast called Candida albicans. This fungus is normally present in small amounts in the mouth and on other mucous membranes. It usually causes no harm. However, when conditions are present that allow the fungus to grow uncontrolled, it invades surrounding tissues and becomes an infection. Less often, other Candida species can also lead to thrush.   RISK FACTORS  Thrush is more likely to develop in the following people:  · People with an impaired ability to fight infection (weakened immune system).    · Older adults.    · People with HIV.    · People with diabetes.    · People with dry mouth (xerostomia).    · Pregnant women.    · People with poor dental care, especially those who have false teeth.    · People who use antibiotic medicines.    SIGNS AND SYMPTOMS   Thrush can be a mild infection that causes no symptoms. If symptoms develop, they may include:   · A burning feeling in the mouth and throat. This can occur at the start of a thrush infection.    · White patches that adhere to the mouth and tongue. The tissue around the patches may be red, raw, and painful. If rubbed (during tooth brushing, for example), the patches and the tissue of the mouth may bleed easily.    · A bad taste in the mouth or difficulty tasting foods.     · Cottony feeling in the mouth.    · Pain during eating and swallowing.  DIAGNOSIS   Your health care provider can usually diagnose thrush by looking in your mouth and asking you questions about your health.   TREATMENT   Medicines that help prevent the growth of fungi (antifungals) are the standard treatment for thrush. These medicines are either applied directly to the affected area (topical) or swallowed (oral). The treatment will depend on the severity of the condition.   Mild Thrush  Mild cases of thrush may clear up with the use of an antifungal mouth rinse or lozenges. Treatment usually lasts about 14 days.   Moderate to Severe Thrush  · More severe thrush infections that have spread to the esophagus are treated with an oral antifungal medicine. A topical antifungal medicine may also be used.    · For some severe infections, a treatment period longer than 14 days may be needed.    · Oral antifungal medicines are almost never used during pregnancy because the fetus may be harmed. However, if a pregnant woman has a rare, severe thrush infection that has spread to her blood, oral antifungal medicines may be used. In this case, the risk of harm to the mother and fetus from the severe thrush infection may be greater than the risk posed by the use of antifungal medicines.    Persistent or Recurrent Thrush  For cases of   thrush that do not go away or keep coming back, treatment may involve the following:   · Treatment may be needed twice as long as the symptoms last.    · Treatment will include both oral and topical antifungal medicines.    · People with weakened immune systems can take an antifungal medicine on a continuous basis to prevent thrush infections.    It is important to treat conditions that make you more likely to get thrush, such as diabetes or HIV.   HOME CARE INSTRUCTIONS   · Only take over-the-counter or prescription medicine as directed by your health care provider. Talk to your health care  provider about an over-the-counter medicine called gentian violet, which kills bacteria and fungi.    · Eat plain, unflavored yogurt as directed by your health care provider. Check the label to make sure the yogurt contains live cultures. This yogurt can help healthy bacteria grow in the mouth that can stop the growth of the fungus that causes thrush.    · Try these measures to help reduce the discomfort of thrush:      Drink cold liquids such as water or iced tea.      Try flavored ice treats or frozen juices.      Eat foods that are easy to swallow, such as gelatin, ice cream, or custard.      If the patches in your mouth are painful, try drinking from a straw.    · Rinse your mouth several times a day with a warm saltwater rinse. You can make the saltwater mixture with 1 tsp (6 g) of salt in 8 fl oz (0.2 L) of warm water.    · If you wear dentures, remove the dentures before going to bed, brush them vigorously, and soak them in a cleaning solution as directed by your health care provider.    · Women who are breastfeeding should clean their nipples with an antifungal medicine as directed by their health care provider. Dry the nipples after breastfeeding. Applying lanolin-containing body lotion may help relieve nipple soreness.    SEEK MEDICAL CARE IF:  · Your symptoms are getting worse or are not improving within 7 days of starting treatment.    · You have symptoms of spreading infection, such as white patches on the skin outside of the mouth.    · You are nursing and you have redness, burning, or pain in the nipples that is not relieved with treatment.    MAKE SURE YOU:  · Understand these instructions.  · Will watch your condition.  · Will get help right away if you are not doing well or get worse.     This information is not intended to replace advice given to you by your health care provider. Make sure you discuss any questions you have with your health care provider.     Document Released: 08/15/2004 Document  Revised: 12/11/2014 Document Reviewed: 06/23/2013  Elsevier Interactive Patient Education ©2016 [employer]

## 2016-02-10 NOTE — Progress Notes (Signed)
Pre visit review using our clinic review tool, if applicable. No additional management support is needed unless otherwise documented below in the visit note. 

## 2016-02-25 ENCOUNTER — Ambulatory Visit: Payer: Medicare Other

## 2016-03-01 ENCOUNTER — Ambulatory Visit
Admission: RE | Admit: 2016-03-01 | Discharge: 2016-03-01 | Disposition: A | Discharge status: Home / Self Care | Payer: Medicare Other | Source: Ambulatory Visit

## 2016-03-01 DIAGNOSIS — Z1231 Encounter for screening mammogram for malignant neoplasm of breast: Secondary | ICD-10-CM

## 2016-03-20 ENCOUNTER — Other Ambulatory Visit: Payer: Self-pay | Admitting: Internal Medicine

## 2016-03-20 MED ORDER — ROSUVASTATIN CALCIUM 10 MG PO TABS: 5.0000 mg | ORAL_TABLET | Freq: Every day | ORAL | Status: DC

## 2016-03-22 ENCOUNTER — Ambulatory Visit (INDEPENDENT_AMBULATORY_CARE_PROVIDER_SITE_OTHER): Payer: Medicare Other

## 2016-03-22 DIAGNOSIS — E538 Deficiency of other specified B group vitamins: Secondary | ICD-10-CM

## 2016-03-22 MED ORDER — CYANOCOBALAMIN 1000 MCG/ML IJ SOLN
1000.0000 ug | Freq: Once | INTRAMUSCULAR | Status: AC
Start: 2016-03-22 — End: 2016-03-22
  Administered 2016-03-22: 1000 ug via INTRAMUSCULAR

## 2016-03-22 NOTE — Progress Notes (Signed)
Patient came in for b12 injection.  Received in Right deltoid.  Patient tolerated well.  

## 2016-04-05 ENCOUNTER — Other Ambulatory Visit: Payer: Self-pay | Admitting: Internal Medicine

## 2016-04-06 ENCOUNTER — Encounter: Payer: Medicare Other | Admitting: Internal Medicine

## 2016-06-30 ENCOUNTER — Ambulatory Visit (INDEPENDENT_AMBULATORY_CARE_PROVIDER_SITE_OTHER): Payer: Medicare Other | Admitting: Internal Medicine

## 2016-06-30 ENCOUNTER — Encounter: Payer: Self-pay | Admitting: Internal Medicine

## 2016-06-30 VITALS — BP 124/78 | HR 62 | Temp 98.1°F | Resp 16 | Ht 63.0 in | Wt 180.0 lb

## 2016-06-30 DIAGNOSIS — E538 Deficiency of other specified B group vitamins: Secondary | ICD-10-CM

## 2016-06-30 DIAGNOSIS — I10 Essential (primary) hypertension: Secondary | ICD-10-CM | POA: Diagnosis not present

## 2016-06-30 DIAGNOSIS — Z658 Other specified problems related to psychosocial circumstances: Secondary | ICD-10-CM

## 2016-06-30 DIAGNOSIS — D649 Anemia, unspecified: Secondary | ICD-10-CM

## 2016-06-30 DIAGNOSIS — R87619 Unspecified abnormal cytological findings in specimens from cervix uteri: Secondary | ICD-10-CM

## 2016-06-30 DIAGNOSIS — R6889 Other general symptoms and signs: Secondary | ICD-10-CM

## 2016-06-30 DIAGNOSIS — E7800 Pure hypercholesterolemia, unspecified: Secondary | ICD-10-CM

## 2016-06-30 DIAGNOSIS — R739 Hyperglycemia, unspecified: Secondary | ICD-10-CM

## 2016-06-30 DIAGNOSIS — R8290 Unspecified abnormal findings in urine: Secondary | ICD-10-CM | POA: Diagnosis not present

## 2016-06-30 DIAGNOSIS — Z0000 Encounter for general adult medical examination without abnormal findings: Secondary | ICD-10-CM

## 2016-06-30 DIAGNOSIS — F439 Reaction to severe stress, unspecified: Secondary | ICD-10-CM

## 2016-06-30 LAB — URINALYSIS, ROUTINE W REFLEX MICROSCOPIC
Bilirubin Urine: NEGATIVE
HGB URINE DIPSTICK: NEGATIVE
KETONES UR: NEGATIVE
LEUKOCYTES UA: NEGATIVE
Nitrite: NEGATIVE
RBC / HPF: NONE SEEN (ref 0–?)
Specific Gravity, Urine: 1.02 (ref 1.000–1.030)
Total Protein, Urine: NEGATIVE
URINE GLUCOSE: NEGATIVE
UROBILINOGEN UA: 0.2 (ref 0.0–1.0)
WBC UA: NONE SEEN (ref 0–?)
pH: 6 (ref 5.0–8.0)

## 2016-06-30 MED ORDER — CYANOCOBALAMIN 1000 MCG/ML IJ SOLN
1000.0000 ug | Freq: Once | INTRAMUSCULAR | Status: AC
  Administered 2016-06-30: 1000 ug via INTRAMUSCULAR

## 2016-06-30 NOTE — Progress Notes (Signed)
Patient ID: Lisa Robles, female   DOB: 09/15/1946, 70 y.o.   MRN: [id number]   Subjective:    Patient ID: Lisa Robles, female    DOB: Sep 04, 1946, 70 y.o.   MRN: [id number]  HPI  Patient here for her physical exam. She has noticed some increased pressure - right lateral neck (actually described as minimal discomfort).  Some increased drainage.  No actual earache.  Some congestion.  Saw her dentist.  Had root canal.  Still with some issues from her tooth.  No fever.  She has been taking some claritin and using afrin.  No chest congestion.  No cough.  No sob.  No acid reflux.  No abdominal pain or cramping.  Bowels stable.  Has noticed odor to her urine.  Wants checked.  Sees gyn for pelvic exams.  Increased stress.  Her boyfriend has been cheating on her.  Overall she feels she is handling things relatively well.  Does not feel needs any further intervention.     Past Medical History:  Diagnosis Date  . GERD (gastroesophageal reflux disease)   . Hypercholesteremia   . Hypertension    Past Surgical History:  Procedure Laterality Date  . TOOTH EXTRACTION     tooth implantation  . TUBAL LIGATION  1979   Family History  Problem Relation Age of Onset  . Hypercholesterolemia Brother   . Colon cancer Neg Hx   . Breast cancer Neg Hx    Social History   Social History  . Marital status: Widowed    Spouse name: N/A  . Number of children: 2  . Years of education: masters   Occupational History  . retired Pharmacist, hospital    Social History Main Topics  . Smoking status: Former Smoker    Quit date: 12/04/1977  . Smokeless tobacco: Never Used  . Alcohol use No  . Drug use: No  . Sexual activity: Yes     Comment: Is sexually active wih same partner x 14 years. Recently found out partner has been cheating.   Other Topics Concern  . None   Social History Narrative  . None    Outpatient Encounter Prescriptions as of 06/30/2016  Medication Sig  . loratadine (CLARITIN) 10 MG tablet  Take 10 mg by mouth daily.  Marland Kitchen ADVAIR DISKUS 100-50 MCG/DOSE AEPB inhale 1 dose by mouth twice a day  . albuterol (PROVENTIL HFA;VENTOLIN HFA) 108 (90 BASE) MCG/ACT inhaler Inhale 2 puffs into the lungs every 6 (six) hours as needed.  Marland Kitchen aspirin 81 MG tablet Take 81 mg by mouth daily.  Marland Kitchen estradiol (ESTRACE) 0.1 MG/GM vaginal cream Place 1 Applicatorful vaginally once a week.  . fluticasone (FLONASE) 50 MCG/ACT nasal spray Place 2 sprays into both nostrils daily.  . hydrochlorothiazide (HYDRODIURIL) 25 MG tablet take 1 tablet by mouth once daily  . rosuvastatin (CRESTOR) 10 MG tablet Take 0.5 tablets (5 mg total) by mouth daily.  . [DISCONTINUED] nystatin (MYCOSTATIN) 100000 UNIT/ML suspension Take 5 mLs (500,000 Units total) by mouth 4 (four) times daily.  . [DISCONTINUED] PROVENTIL HFA 108 (90 BASE) MCG/ACT inhaler inhale 2 puffs by mouth every 6 hours if needed  . [EXPIRED] cyanocobalamin ((VITAMIN B-12)) injection 1,000 mcg    No facility-administered encounter medications on file as of 06/30/2016.     Review of Systems  Constitutional: Negative for appetite change and unexpected weight change.  HENT: Positive for congestion, postnasal drip and sore throat. Negative for sinus pressure.  Some neck discomfort - right neck.   Eyes: Negative for pain and visual disturbance.  Respiratory: Negative for cough, chest tightness and shortness of breath.   Cardiovascular: Negative for chest pain, palpitations and leg swelling.  Gastrointestinal: Negative for abdominal pain, diarrhea, nausea and vomiting.  Genitourinary: Negative for difficulty urinating, dysuria and frequency.       Has noticed a bad odor to her urine.    Musculoskeletal: Negative for back pain and joint swelling.  Skin: Negative for color change and rash.  Neurological: Negative for dizziness, light-headedness and headaches.  Hematological: Negative for adenopathy. Does not bruise/bleed easily.  Psychiatric/Behavioral:  Negative for agitation and dysphoric mood.       Objective:    Physical Exam  Constitutional: She is oriented to person, place, and time. She appears well-developed and well-nourished. No distress.  HENT:  Nose: Nose normal.  Mouth/Throat: Oropharynx is clear and moist.  Eyes: Right eye exhibits no discharge. Left eye exhibits no discharge. No scleral icterus.  Neck: Neck supple. No thyromegaly present.  Minimal discomfort - right lateral neck.    Cardiovascular: Normal rate and regular rhythm.   Pulmonary/Chest: Breath sounds normal. No accessory muscle usage. No tachypnea. No respiratory distress. She has no decreased breath sounds. She has no wheezes. She has no rhonchi. Right breast exhibits no inverted nipple, no mass, no nipple discharge and no tenderness (no axillary adenopathy). Left breast exhibits no inverted nipple, no mass, no nipple discharge and no tenderness (no axilarry adenopathy).  Abdominal: Soft. Bowel sounds are normal. There is no tenderness.  Musculoskeletal: She exhibits no edema or tenderness.  Lymphadenopathy:    She has no cervical adenopathy.  Neurological: She is alert and oriented to person, place, and time.  Skin: Skin is warm. No rash noted. No erythema.  Psychiatric: She has a normal mood and affect. Her behavior is normal.    BP 124/78 (BP Location: Left Arm, Patient Position: Sitting, Cuff Size: Large)   Pulse 62   Temp 98.1 F (36.7 C) (Oral)   Resp 16   Ht _0  (1.6 m)   Wt 180 lb (81.6 kg)   SpO2 95%   BMI 31.89 kg/m  Wt Readings from Last 3 Encounters:  06/30/16 180 lb (81.6 kg)  02/10/16 182 lb (82.6 kg)  01/19/16 178 lb 6.4 oz (80.9 kg)     Lab Results  Component Value Date   WBC 7.6 01/11/2016   HGB 13.4 01/11/2016   HCT 40.3 01/11/2016   PLT 311.0 01/11/2016   GLUCOSE 103 (H) 01/11/2016   CHOL 211 (H) 01/11/2016   TRIG 139.0 01/11/2016   HDL 58.60 01/11/2016   LDLDIRECT 142.7 12/11/2013   LDLCALC 125 (H) 01/11/2016    ALT 13 01/11/2016   AST 18 01/11/2016   NA 137 01/11/2016   K 3.7 01/11/2016   CL 101 01/11/2016   CREATININE 0.72 01/11/2016   BUN 16 01/11/2016   CO2 32 01/11/2016   TSH 1.15 04/15/2015   HGBA1C 6.0 01/11/2016   MICROALBUR 0.2 12/11/2013    Mm Screening Breast Tomo Bilateral  Result Date: 03/02/2016 CLINICAL DATA:  Screening. EXAM: 2D DIGITAL SCREENING BILATERAL MAMMOGRAM WITH CAD AND ADJUNCT TOMO COMPARISON:  Previous exam(s). ACR Breast Density Category b: There are scattered areas of fibroglandular density. FINDINGS: There are no findings suspicious for malignancy. Images were processed with CAD. IMPRESSION: No mammographic evidence of malignancy. A result letter of this screening mammogram will be mailed directly to the patient. RECOMMENDATION: Screening  mammogram in one year. (Code:SM-B-01Y) BI-RADS CATEGORY  1: Negative. Electronically Signed   By: Evangeline Dakin M.D.   On: 03/02/2016 17:56       Assessment & Plan:   Problem List Items Addressed This Visit    Abnormal Pap smear of cervix    Sees gyn.  Discussed with her today.  She needs f/u with gyn.  She will make her own appt.        Anemia    Had EGD and colonoscopy 2014.  Continue b12 injections.  Follow cbc.       Relevant Medications   cyanocobalamin ((VITAMIN B-12)) injection 1,000 mcg (Completed)   Other Relevant Orders   CBC with Differential/Platelet   B12 deficiency   Relevant Medications   cyanocobalamin ((VITAMIN B-12)) injection 1,000 mcg (Completed)   Health care maintenance    Physical today 06/30/16.  PAP followed by gyn.  Mammogram 03/02/16 - Birads I.  Colonoscopy 2014.        Hypercholesterolemia    On crestor.  Low cholesterol diet and exercise.  Follow lipid panel and liver function tests.        Relevant Orders   Lipid panel   Hepatic function panel   Hyperglycemia    Low carb diet and exercise.  Follow met b and a1c.        Relevant Orders   Hemoglobin A1c   Hypertension    Blood  pressure under good control.  Continue same medication regimen.  Follow pressures.  Follow metabolic panel.        Relevant Orders   TSH   Basic metabolic panel   Stress    Increased stress as outlined.  Discussed with her today.  She feels she is handling things relatively well.  Does not feel needs anything more.  Follow        Other Visit Diagnoses    Routine general medical examination at a health care facility    -  Primary   Bad odor of urine       check urinalysis and culture to confirm no infection.    Relevant Orders   Urinalysis, Routine w reflex microscopic (not at [facility]) (Completed)   CULTURE, URINE COMPREHENSIVE   Congestion of throat       has the congestion and neck discomfort as outlined. saw her dentist. had root canal.  pain may be from her tooth. f/u with dentist.allergy treatment as outlined       Einar Pheasant, MD

## 2016-06-30 NOTE — Patient Instructions (Signed)
Saline nasal spray - flush nose at least 2-3x/day  nasacort nasal spray - 2 sprays each nostril one time per day.  Do this in the evening.    If increased congestion, mucinex daily.

## 2016-06-30 NOTE — Assessment & Plan Note (Signed)
Physical today 06/30/16.  PAP followed by gyn.  Mammogram 03/02/16 - Birads I.  Colonoscopy 2014.

## 2016-07-02 ENCOUNTER — Encounter: Payer: Self-pay | Admitting: Internal Medicine

## 2016-07-02 DIAGNOSIS — F439 Reaction to severe stress, unspecified: Secondary | ICD-10-CM | POA: Insufficient documentation

## 2016-07-02 NOTE — Assessment & Plan Note (Signed)
Sees gyn.  Discussed with her today.  She needs f/u with gyn.  She will make her own appt.

## 2016-07-02 NOTE — Assessment & Plan Note (Signed)
Increased stress as outlined.  Discussed with her today.  She feels she is handling things relatively well.  Does not feel needs anything more.  Follow

## 2016-07-02 NOTE — Assessment & Plan Note (Signed)
Blood pressure under good control.  Continue same medication regimen.  Follow pressures.  Follow metabolic panel.   

## 2016-07-02 NOTE — Assessment & Plan Note (Signed)
Had EGD and colonoscopy 2014.  Continue b12 injections.  Follow cbc.

## 2016-07-02 NOTE — Assessment & Plan Note (Signed)
On crestor.  Low cholesterol diet and exercise.  Follow lipid panel and liver function tests.   

## 2016-07-02 NOTE — Assessment & Plan Note (Signed)
Low carb diet and exercise.  Follow met b and a1c.   

## 2016-07-03 ENCOUNTER — Telehealth: Payer: Self-pay | Admitting: *Deleted

## 2016-07-03 LAB — CULTURE, URINE COMPREHENSIVE

## 2016-07-03 NOTE — Telephone Encounter (Signed)
Patient has requested lab result from  06/30/16 Pt contact [phone]

## 2016-07-03 NOTE — Telephone Encounter (Signed)
Spoke with patient, reviewed labs, declined a follow up visit. thanks

## 2016-07-14 ENCOUNTER — Other Ambulatory Visit (INDEPENDENT_AMBULATORY_CARE_PROVIDER_SITE_OTHER): Payer: Medicare Other

## 2016-07-14 ENCOUNTER — Telehealth: Payer: Self-pay

## 2016-07-14 DIAGNOSIS — R739 Hyperglycemia, unspecified: Secondary | ICD-10-CM | POA: Diagnosis not present

## 2016-07-14 DIAGNOSIS — D649 Anemia, unspecified: Secondary | ICD-10-CM | POA: Diagnosis not present

## 2016-07-14 DIAGNOSIS — E7800 Pure hypercholesterolemia, unspecified: Secondary | ICD-10-CM | POA: Diagnosis not present

## 2016-07-14 DIAGNOSIS — I10 Essential (primary) hypertension: Secondary | ICD-10-CM | POA: Diagnosis not present

## 2016-07-14 LAB — HEPATIC FUNCTION PANEL
ALBUMIN: 3.9 g/dL (ref 3.5–5.2)
ALK PHOS: 59 U/L (ref 39–117)
ALT: 15 U/L (ref 0–35)
AST: 19 U/L (ref 0–37)
BILIRUBIN DIRECT: 0.1 mg/dL (ref 0.0–0.3)
Total Bilirubin: 0.6 mg/dL (ref 0.2–1.2)
Total Protein: 7.4 g/dL (ref 6.0–8.3)

## 2016-07-14 LAB — CBC WITH DIFFERENTIAL/PLATELET
BASOS PCT: 0.5 % (ref 0.0–3.0)
Basophils Absolute: 0 10*3/uL (ref 0.0–0.1)
EOS ABS: 0.1 10*3/uL (ref 0.0–0.7)
Eosinophils Relative: 1.8 % (ref 0.0–5.0)
HCT: 38.4 % (ref 36.0–46.0)
Hemoglobin: 12.9 g/dL (ref 12.0–15.0)
LYMPHS ABS: 3.2 10*3/uL (ref 0.7–4.0)
Lymphocytes Relative: 43.9 % (ref 12.0–46.0)
MCHC: 33.6 g/dL (ref 30.0–36.0)
MCV: 91.8 fl (ref 78.0–100.0)
MONO ABS: 0.7 10*3/uL (ref 0.1–1.0)
Monocytes Relative: 10 % (ref 3.0–12.0)
NEUTROS ABS: 3.2 10*3/uL (ref 1.4–7.7)
Neutrophils Relative %: 43.8 % (ref 43.0–77.0)
PLATELETS: 295 10*3/uL (ref 150.0–400.0)
RBC: 4.18 Mil/uL (ref 3.87–5.11)
RDW: 14.1 % (ref 11.5–15.5)
WBC: 7.4 10*3/uL (ref 4.0–10.5)

## 2016-07-14 LAB — BASIC METABOLIC PANEL
BUN: 14 mg/dL (ref 6–23)
CHLORIDE: 103 meq/L (ref 96–112)
CO2: 30 meq/L (ref 19–32)
CREATININE: 0.78 mg/dL (ref 0.40–1.20)
Calcium: 9.7 mg/dL (ref 8.4–10.5)
GFR: 93.33 mL/min (ref >60.00)
GLUCOSE: 111 mg/dL — AB (ref 70–99)
Potassium: 3.5 mEq/L (ref 3.5–5.1)
Sodium: 138 mEq/L (ref 135–145)

## 2016-07-14 LAB — LIPID PANEL
Cholesterol: 201 mg/dL — ABNORMAL HIGH (ref 0–200)
HDL: 57.7 mg/dL (ref >39.00)
LDL Cholesterol: 116 mg/dL — ABNORMAL HIGH (ref 0–99)
NONHDL: 142.96
Total CHOL/HDL Ratio: 3
Triglycerides: 137 mg/dL (ref 0.0–149.0)
VLDL: 27.4 mg/dL (ref 0.0–40.0)

## 2016-07-14 LAB — HEMOGLOBIN A1C: Hgb A1c MFr Bld: 5.9 % (ref 4.6–6.5)

## 2016-07-14 LAB — TSH: TSH: 1.35 u[IU]/mL (ref 0.35–4.50)

## 2016-07-14 NOTE — Telephone Encounter (Signed)
Noted thanks °

## 2016-07-14 NOTE — Telephone Encounter (Signed)
Patient came in for labs, she stopped at the front desk and requested follow up on her ENT referral, appt scheduled for august 17th, no available appts prior.  She still having complaints of her ear aching.  She has been having moments that she is just not feeling well. Think maybe her BP is high, requested to have her BP checked.  Checked it and On the left with manual cuff 122/78, HR 70's. Patient relieved and states she will wait and see what the ENT says.  Thanks

## 2016-07-14 NOTE — Telephone Encounter (Signed)
Reviewed message.  Please let her know to let us know if needs anything prior.  Thanks

## 2016-07-17 ENCOUNTER — Encounter: Payer: Self-pay | Admitting: *Deleted

## 2016-08-17 ENCOUNTER — Ambulatory Visit (INDEPENDENT_AMBULATORY_CARE_PROVIDER_SITE_OTHER): Payer: Medicare Other

## 2016-08-17 DIAGNOSIS — E538 Deficiency of other specified B group vitamins: Secondary | ICD-10-CM

## 2016-08-17 DIAGNOSIS — Z23 Encounter for immunization: Secondary | ICD-10-CM | POA: Diagnosis not present

## 2016-08-17 MED ORDER — CYANOCOBALAMIN 1000 MCG/ML IJ SOLN
1000.0000 ug | Freq: Once | INTRAMUSCULAR | Status: AC
  Administered 2016-08-17: 1000 ug via INTRAMUSCULAR

## 2016-08-17 NOTE — Progress Notes (Addendum)
Patient came in for a b12 injection, received in Right deltoid.  Patient tolerated well.  Also received Flu vaccine.   Reviewed.    Dr Lorin PicketScott

## 2016-09-14 ENCOUNTER — Other Ambulatory Visit: Payer: Self-pay | Admitting: Internal Medicine

## 2016-09-20 ENCOUNTER — Ambulatory Visit (INDEPENDENT_AMBULATORY_CARE_PROVIDER_SITE_OTHER): Payer: Medicare Other

## 2016-09-20 DIAGNOSIS — E538 Deficiency of other specified B group vitamins: Secondary | ICD-10-CM | POA: Diagnosis not present

## 2016-09-20 MED ORDER — CYANOCOBALAMIN 1000 MCG/ML IJ SOLN
1000.0000 ug | Freq: Once | INTRAMUSCULAR | Status: AC
  Administered 2016-09-20: 1000 ug via INTRAMUSCULAR

## 2016-09-20 NOTE — Progress Notes (Addendum)
Patient came in for b12 injection.  Received in Left deltoid.  Patient tolerated well.   Addendum.  Reviewed.   Dr Lorin PicketScott

## 2016-10-02 ENCOUNTER — Encounter: Payer: Self-pay | Admitting: Family Medicine

## 2016-10-02 ENCOUNTER — Ambulatory Visit: Payer: Medicare Other | Admitting: Family Medicine

## 2016-10-02 ENCOUNTER — Ambulatory Visit (INDEPENDENT_AMBULATORY_CARE_PROVIDER_SITE_OTHER): Payer: Medicare Other | Admitting: Family Medicine

## 2016-10-02 VITALS — BP 136/88 | HR 73 | Temp 98.2°F | Wt 180.4 lb

## 2016-10-02 DIAGNOSIS — R300 Dysuria: Secondary | ICD-10-CM | POA: Diagnosis not present

## 2016-10-02 LAB — POCT URINALYSIS DIPSTICK
BILIRUBIN UA: NEGATIVE
GLUCOSE UA: NEGATIVE
Nitrite, UA: POSITIVE
SPEC GRAV UA: 1.02
Urobilinogen, UA: 0.2
pH, UA: 7

## 2016-10-02 LAB — URINALYSIS, MICROSCOPIC ONLY

## 2016-10-02 MED ORDER — CEPHALEXIN 500 MG PO CAPS: 500.0000 mg | ORAL_CAPSULE | Freq: Two times a day (BID) | ORAL | 0 refills | Status: DC

## 2016-10-02 NOTE — Progress Notes (Signed)
Pre visit review using our clinic review tool, if applicable. No additional management support is needed unless otherwise documented below in the visit note. 

## 2016-10-02 NOTE — Assessment & Plan Note (Addendum)
Symptoms and UA post consistent with UTI. We'll start on Keflex. We'll send urine for culture and microscopy. I discussed preventative measures with her. Given return precautions.

## 2016-10-02 NOTE — Patient Instructions (Signed)
Nice to meet you. It appears that you have a UTI. We will treat with Keflex. We are going to send your urine for culture to evaluate for infection further. If you develop abdominal pain, fevers, vaginal discharge, or any new or change in symptoms please seek medical attention immediately.

## 2016-10-02 NOTE — Progress Notes (Signed)
  Lisa AlarEric Sonnenberg, MD Phone: [phone]  Lisa MillerGracetta W Robles is a 70 y.o. female who presents today for same-day visit.  Dysuria: Patient notes her last 3 days she's had dysuria, urinary odor, and urinary urgency. No frequency. No fevers. No vaginal discharge. No abdominal pain. She traveled recently and notes she does sometimes get UTIs after traveling. Does have a history of UTIs in the past.  ROS see history of present illness  Objective  Physical Exam Vitals:   10/02/16 1032  BP: 136/88  Pulse: 73  Temp: 98.2 F (36.8 C)    BP Readings from Last 3 Encounters:  10/02/16 136/88  06/30/16 124/78  02/10/16 124/76   Wt Readings from Last 3 Encounters:  10/02/16 180 lb 6.4 oz (81.8 kg)  06/30/16 180 lb (81.6 kg)  02/10/16 182 lb (82.6 kg)    Physical Exam  Constitutional: She is well-developed, well-nourished, and in no distress.  Cardiovascular: Normal rate, regular rhythm and normal heart sounds.   Pulmonary/Chest: Effort normal and breath sounds normal.  Abdominal: Soft. Bowel sounds are normal. She exhibits no distension. There is no tenderness. There is no rebound and no guarding.  Neurological: She is alert. Gait normal.  Skin: Skin is warm and dry.     Assessment/Plan: Please see individual problem list.  Dysuria Symptoms and UA post consistent with UTI. We'll start on Keflex. We'll send urine for culture and microscopy. Given return precautions.   Orders Placed This Encounter  Procedures  . Urine Culture  . Urine Microscopic Only  . POCT Urinalysis Dipstick    Meds ordered this encounter  Medications  . cephALEXin (KEFLEX) 500 MG capsule    Sig: Take 1 capsule (500 mg total) by mouth 2 (two) times daily.    Dispense:  14 capsule    Refill:  0    Lisa AlarEric Sonnenberg, MD [facility] [place]

## 2016-10-04 ENCOUNTER — Ambulatory Visit: Payer: Medicare Other | Admitting: Internal Medicine

## 2016-10-05 LAB — URINE CULTURE

## 2016-10-24 ENCOUNTER — Ambulatory Visit: Payer: Medicare Other

## 2016-10-25 ENCOUNTER — Ambulatory Visit (INDEPENDENT_AMBULATORY_CARE_PROVIDER_SITE_OTHER): Payer: Medicare Other

## 2016-10-25 DIAGNOSIS — E538 Deficiency of other specified B group vitamins: Secondary | ICD-10-CM | POA: Diagnosis not present

## 2016-10-25 MED ORDER — CYANOCOBALAMIN 1000 MCG/ML IJ SOLN
1000.0000 ug | Freq: Once | INTRAMUSCULAR | Status: AC
  Administered 2016-10-25: 1000 ug via INTRAMUSCULAR

## 2016-10-25 NOTE — Progress Notes (Addendum)
Patient comes in for B 12 injection.  Injected right deltoid patient tolerated injection well.    Reviewed.  Dr [name] 

## 2016-12-06 ENCOUNTER — Ambulatory Visit (INDEPENDENT_AMBULATORY_CARE_PROVIDER_SITE_OTHER): Payer: Medicare Other

## 2016-12-06 DIAGNOSIS — E538 Deficiency of other specified B group vitamins: Secondary | ICD-10-CM | POA: Diagnosis not present

## 2016-12-06 MED ORDER — CYANOCOBALAMIN 1000 MCG/ML IJ SOLN
1000.0000 ug | Freq: Once | INTRAMUSCULAR | Status: AC
  Administered 2016-12-06: 1000 ug via INTRAMUSCULAR

## 2016-12-06 NOTE — Progress Notes (Addendum)
Patient comes in for B 12.  Injected left deltoid.  Patient tolerated injection well.    Reviewed.   Dr [name] 

## 2017-01-15 ENCOUNTER — Other Ambulatory Visit: Payer: Self-pay | Admitting: Internal Medicine

## 2017-01-18 ENCOUNTER — Ambulatory Visit (INDEPENDENT_AMBULATORY_CARE_PROVIDER_SITE_OTHER): Payer: Medicare Other

## 2017-01-18 VITALS — BP 130/70 | HR 64 | Temp 97.9°F | Resp 12 | Ht 63.0 in | Wt 180.4 lb

## 2017-01-18 DIAGNOSIS — Z0000 Encounter for general adult medical examination without abnormal findings: Secondary | ICD-10-CM | POA: Diagnosis not present

## 2017-01-18 DIAGNOSIS — E538 Deficiency of other specified B group vitamins: Secondary | ICD-10-CM

## 2017-01-18 MED ORDER — CYANOCOBALAMIN 1000 MCG/ML IJ SOLN
1000.0000 ug | Freq: Once | INTRAMUSCULAR | Status: AC
  Administered 2017-01-18: 1000 ug via INTRAMUSCULAR

## 2017-01-18 NOTE — Progress Notes (Signed)
Subjective:   Lisa Robles is a 71 y.o. female who presents for Medicare Annual (Subsequent) preventive examination.  Review of Systems:  No ROS.  Medicare Wellness Visit. Cardiac Risk Factors include: advanced age (>41men, >77 women);hypertension;obesity (BMI >30kg/m2)     Objective:     Vitals: BP 130/70 (BP Location: Right Arm, Patient Position: Sitting, Cuff Size: Normal)   Pulse 64   Temp 97.9 F (36.6 C) (Oral)   Resp 12   Ht 5\' 3"  (1.6 m)   Wt 180 lb 6.4 oz (81.8 kg)   SpO2 96%   BMI 31.96 kg/m   Body mass index is 31.96 kg/m.   Tobacco History  Smoking Status  . Former Smoker  . Quit date: 12/04/1977  Smokeless Tobacco  . Never Used     Counseling given: Not Answered   Past Medical History:  Diagnosis Date  . GERD (gastroesophageal reflux disease)   . Hypercholesteremia   . Hypertension    Past Surgical History:  Procedure Laterality Date  . TOOTH EXTRACTION     tooth implantation  . TUBAL LIGATION  1979   Family History  Problem Relation Age of Onset  . Hypercholesterolemia Brother   . Cancer Maternal Aunt     breast  . Colon cancer Neg Hx   . Breast cancer Neg Hx    History  Sexual Activity  . Sexual activity: Yes    Comment: Is sexually active wih same partner x 14 years. Recently found out partner has been cheating.    Outpatient Encounter Prescriptions as of 01/18/2017  Medication Sig  . ADVAIR DISKUS 100-50 MCG/DOSE AEPB inhale 1 dose by mouth twice a day  . albuterol (PROVENTIL HFA;VENTOLIN HFA) 108 (90 BASE) MCG/ACT inhaler Inhale 2 puffs into the lungs every 6 (six) hours as needed.  Marland Kitchen aspirin 81 MG tablet Take 81 mg by mouth daily.  Marland Kitchen estradiol (ESTRACE) 0.1 MG/GM vaginal cream Place 1 Applicatorful vaginally once a week.  . fluticasone (FLONASE) 50 MCG/ACT nasal spray Place 2 sprays into both nostrils daily.  . hydrochlorothiazide (HYDRODIURIL) 25 MG tablet take 1 tablet by mouth once daily  . loratadine (CLARITIN) 10 MG  tablet Take 10 mg by mouth daily.  . rosuvastatin (CRESTOR) 10 MG tablet take 1/2 tablet by mouth once daily  . [DISCONTINUED] cephALEXin (KEFLEX) 500 MG capsule Take 1 capsule (500 mg total) by mouth 2 (two) times daily.  . [EXPIRED] cyanocobalamin ((VITAMIN B-12)) injection 1,000 mcg    No facility-administered encounter medications on file as of 01/18/2017.     Activities of Daily Living In your present state of health, do you have any difficulty performing the following activities: 01/18/2017 06/30/2016  Hearing? N N  Vision? N N  Difficulty concentrating or making decisions? N N  Walking or climbing stairs? Y N  Dressing or bathing? N N  Doing errands, shopping? N N  Preparing Food and eating ? N -  Using the Toilet? N -  In the past six months, have you accidently leaked urine? N -  Do you have problems with loss of bowel control? N -  Managing your Medications? N -  Managing your Finances? N -  Housekeeping or managing your Housekeeping? N -  Some recent data might be hidden    Patient Care Team: Dale Marietta, MD as PCP - General (Internal Medicine)    Assessment:    This is a routine wellness examination for Lisa Robles. The goal of the wellness visit is  to assist the patient how to close the gaps in care and create a preventative care plan for the patient.   Osteoporosis risk reviewed.  Medications reviewed; taking without issues or barriers.  Safety issues reviewed; smoke detectors in the home. No firearms in the home. Wears seatbelts when driving or riding with others. No violence in the home.  No identified risk were noted; The patient was oriented x 3; appropriate in dress and manner and no objective failures at ADL's or IADL's.   BMI; discussed the importance of a healthy diet, water intake and exercise. Educational material provided.  HTN; followed by PCP.  Colonoscopy discussed; educational material provided.  B12 injection; monthly per PCP order.   Administered today R deltoid, tolerated well.    Patient Concerns: None at this time. Follow up with PCP as needed.  Exercise Activities and Dietary recommendations Current Exercise Habits: Home exercise routine, Type of exercise: walking, Time (Minutes): 20, Frequency (Times/Week): 4, Weekly Exercise (Minutes/Week): 80, Intensity: Moderate  Goals    . increase physical activity          Use fitbit to track 10,000 steps when walking Stay active and continue walking for exercise      Fall Risk Fall Risk  01/18/2017 06/30/2016 01/19/2016 04/19/2015 12/16/2013  Falls in the past year? No No No No No   Depression Screen PHQ 2/9 Scores 01/18/2017 06/30/2016 01/19/2016 04/19/2015  PHQ - 2 Score 0 1 0 0     Cognitive Function MMSE - Mini Mental State Exam 01/18/2017 01/19/2016  Orientation to time 5 5  Orientation to Place 5 5  Registration 3 3  Attention/ Calculation 5 5  Recall 3 3  Language- name 2 objects 2 2  Language- repeat 1 1  Language- follow 3 step command 3 3  Language- read & follow direction 1 1  Write a sentence 1 1  Copy design 1 1  Total score 30 30        Immunization History  Administered Date(s) Administered  . Influenza, High Dose Seasonal PF 08/17/2016  . Influenza,inj,Quad PF,36+ Mos 07/29/2014  . Influenza-Unspecified 09/01/2013, 09/09/2015  . Pneumococcal Conjugate-13 12/01/2015  . Pneumococcal Polysaccharide-23 07/18/2011  . Tdap 05/19/2011   Screening Tests Health Maintenance  Topic Date Due  . COLONOSCOPY  04/24/2016  . MAMMOGRAM  03/01/2018  . TETANUS/TDAP  05/18/2021  . INFLUENZA VACCINE  Completed  . DEXA SCAN  Completed  . ZOSTAVAX  Addressed  . Hepatitis C Screening  Completed  . PNA vac Low Risk Adult  Completed      Plan:    End of life planning; Advance aging; Advanced directives discussed. No HCPOA/Living Will.  Additional information declined at this time.  Medicare Attestation I have personally reviewed: The patient's  medical and social history Their use of alcohol, tobacco or illicit drugs Their current medications and supplements The patient's functional ability including ADLs,fall risks, home safety risks, cognitive, and hearing and visual impairment Diet and physical activities Evidence for depression   The patient's weight, height, BMI, and visual acuity have been recorded in the chart.  I have made referrals and provided education to the patient based on review of the above and I have provided the patient with a written personalized care plan for preventive services.    During the course of the visit the patient was educated and counseled about the following appropriate screening and preventive services:   Vaccines to include Pneumoccal, Influenza, Hepatitis B, Td, Zostavax, HCV  Electrocardiogram  Cardiovascular Disease  Colorectal cancer screening  Bone density screening  Diabetes screening  Glaucoma screening  Mammography/PAP  Nutrition counseling   Patient Instructions (the written plan) was given to the patient.   Ashok Pall, LPN  1/61/0960   Reviewed above information.  Agree with plan.  Dr Lorin Picket

## 2017-01-18 NOTE — Patient Instructions (Addendum)
Lisa Robles , Thank you for taking time to come for your Medicare Wellness Visit. I appreciate your ongoing commitment to your health goals. Please review the following plan we discussed and let me know if I can assist you in the future.   Follow up with Dr. Lorin Picket as needed.  These are the goals we discussed: Goals    . increase physical activity          Use fitbit to track 10,000 steps when walking Stay active and continue walking for exercise       This is a list of the screening recommended for you and due dates:  Health Maintenance  Topic Date Due  . Colon Cancer Screening  04/24/2016  . Mammogram  03/01/2018  . Tetanus Vaccine  05/18/2021  . Flu Shot  Completed  . DEXA scan (bone density measurement)  Completed  . Shingles Vaccine  Addressed  .  Hepatitis C: One time screening is recommended by Center for Disease Control  (CDC) for  adults born from [birth year] through 1967.   Completed  . Pneumonia vaccines  Completed      Colonoscopy, Adult A colonoscopy is an exam to look at the entire large intestine. During the exam, a lubricated, bendable tube is inserted into the anus and then passed into the rectum, colon, and other parts of the large intestine. A colonoscopy is often done as a part of normal colorectal screening or in response to certain symptoms, such as anemia, persistent diarrhea, abdominal pain, and blood in the stool. The exam can help screen for and diagnose medical problems, including:  Tumors.  Polyps.  Inflammation.  Areas of bleeding. Tell a health care provider about:  Any allergies you have.  All medicines you are taking, including vitamins, herbs, eye drops, creams, and over-the-counter medicines.  Any problems you or family members have had with anesthetic medicines.  Any blood disorders you have.  Any surgeries you have had.  Any medical conditions you have.  Any problems you have had passing stool. What are the risks? Generally, this is  a safe procedure. However, problems may occur, including:  Bleeding.  A tear in the intestine.  A reaction to medicines given during the exam.  Infection (rare). What happens before the procedure? Eating and drinking restrictions  Follow instructions from your health care provider about eating and drinking, which may include:  A few days before the procedure - follow a low-fiber diet. Avoid nuts, seeds, dried fruit, raw fruits, and vegetables.  1-3 days before the procedure - follow a clear liquid diet. Drink only clear liquids, such as clear broth or bouillon, black coffee or tea, clear juice, clear soft drinks or sports drinks, gelatin desert, and popsicles. Avoid any liquids that contain red or purple dye.  On the day of the procedure - do not eat or drink anything during the 2 hours before the procedure, or within the time period that your health care provider recommends. Bowel prep  If you were prescribed an oral bowel prep to clean out your colon:  Take it as told by your health care provider. Starting the day before your procedure, you will need to drink a large amount of medicated liquid. The liquid will cause you to have multiple loose stools until your stool is almost clear or light green.  If your skin or anus gets irritated from diarrhea, you may use these to relieve the irritation:  Medicated wipes, such as adult wet wipes with aloe  and vitamin E.  A skin soothing-product like petroleum jelly.  If you vomit while drinking the bowel prep, take a break for up to 60 minutes and then begin the bowel prep again. If vomiting continues and you cannot take the bowel prep without vomiting, call your health care provider. General instructions  Ask your health care provider about changing or stopping your regular medicines. This is especially important if you are taking diabetes medicines or blood thinners.  Plan to have someone take you home from the hospital or clinic. What  happens during the procedure?  An IV tube may be inserted into one of your veins.  You will be given medicine to help you relax (sedative).  To reduce your risk of infection:  Your health care team will wash or sanitize their hands.  Your anal area will be washed with soap.  You will be asked to lie on your side with your knees bent.  Your health care provider will lubricate a long, thin, flexible tube. The tube will have a camera and a light on the end.  The tube will be inserted into your anus.  The tube will be gently eased through your rectum and colon.  Air will be delivered into your colon to keep it open. You may feel some pressure or cramping.  The camera will be used to take images during the procedure.  A small tissue sample may be removed from your body to be examined under a microscope (biopsy). If any potential problems are found, the tissue will be sent to a lab for testing.  If small polyps are found, your health care provider may remove them and have them checked for cancer cells.  The tube that was inserted into your anus will be slowly removed. The procedure may vary among health care providers and hospitals. What happens after the procedure?  Your blood pressure, heart rate, breathing rate, and blood oxygen level will be monitored until the medicines you were given have worn off.  Do not drive for 24 hours after the exam.  You may have a small amount of blood in your stool.  You may pass gas and have mild abdominal cramping or bloating due to the air that was used to inflate your colon during the exam.  It is up to you to get the results of your procedure. Ask your health care provider, or the department performing the procedure, when your results will be ready. This information is not intended to replace advice given to you by your health care provider. Make sure you discuss any questions you have with your health care provider. Document Released:  11/17/2000 Document Revised: 06/09/2016 Document Reviewed: 02/01/2016 Elsevier Interactive Patient Education  2017 [employer]

## 2017-01-31 ENCOUNTER — Ambulatory Visit (INDEPENDENT_AMBULATORY_CARE_PROVIDER_SITE_OTHER): Payer: Medicare Other | Admitting: Internal Medicine

## 2017-01-31 ENCOUNTER — Encounter: Payer: Self-pay | Admitting: Internal Medicine

## 2017-01-31 ENCOUNTER — Ambulatory Visit: Payer: Medicare Other | Admitting: Internal Medicine

## 2017-01-31 ENCOUNTER — Other Ambulatory Visit: Payer: Self-pay | Admitting: Internal Medicine

## 2017-01-31 VITALS — BP 128/68 | HR 98 | Temp 98.7°F | Resp 16 | Ht 63.0 in | Wt 178.6 lb

## 2017-01-31 DIAGNOSIS — E538 Deficiency of other specified B group vitamins: Secondary | ICD-10-CM | POA: Diagnosis not present

## 2017-01-31 DIAGNOSIS — K629 Disease of anus and rectum, unspecified: Secondary | ICD-10-CM

## 2017-01-31 DIAGNOSIS — F439 Reaction to severe stress, unspecified: Secondary | ICD-10-CM

## 2017-01-31 DIAGNOSIS — Z1231 Encounter for screening mammogram for malignant neoplasm of breast: Secondary | ICD-10-CM

## 2017-01-31 DIAGNOSIS — I10 Essential (primary) hypertension: Secondary | ICD-10-CM

## 2017-01-31 DIAGNOSIS — E7800 Pure hypercholesterolemia, unspecified: Secondary | ICD-10-CM

## 2017-01-31 DIAGNOSIS — R739 Hyperglycemia, unspecified: Secondary | ICD-10-CM | POA: Diagnosis not present

## 2017-01-31 MED ORDER — HYDROCORTISONE ACE-PRAMOXINE 1-1 % RE CREA: 1.0000 "application " | TOPICAL_CREAM | Freq: Two times a day (BID) | RECTAL | 0 refills | Status: DC

## 2017-01-31 NOTE — Progress Notes (Signed)
Patient ID: BELLARAE Robles, female   DOB: 1946/05/08, 71 y.o.   MRN: [id number]   Subjective:    Patient ID: Lisa Robles, female    DOB: 1946-11-17, 71 y.o.   MRN: [id number]  HPI  Patient here for a scheduled follow up.  States she is doing well.  Feels good.  Tries to stay active.  No chest pain.  No sob.  No acid reflux. No abdominal pain or cramping.  Bowels stable.  She does report noticing a lesion around her rectum.  Using preparation H.  No pain with bowels movement.  No blood in her stool.     Past Medical History:  Diagnosis Date  . GERD (gastroesophageal reflux disease)   . Hypercholesteremia   . Hypertension    Past Surgical History:  Procedure Laterality Date  . TOOTH EXTRACTION     tooth implantation  . TUBAL LIGATION  1979   Family History  Problem Relation Age of Onset  . Hypercholesterolemia Brother   . Cancer Maternal Aunt     breast  . Colon cancer Neg Hx   . Breast cancer Neg Hx    Social History   Social History  . Marital status: Widowed    Spouse name: N/A  . Number of children: 2  . Years of education: masters   Occupational History  . retired Pharmacist, hospital    Social History Main Topics  . Smoking status: Former Smoker    Quit date: 12/04/1977  . Smokeless tobacco: Never Used  . Alcohol use No  . Drug use: No  . Sexual activity: Yes     Comment: Is sexually active wih same partner x 14 years. Recently found out partner has been cheating.   Other Topics Concern  . None   Social History Narrative  . None    Outpatient Encounter Prescriptions as of 01/31/2017  Medication Sig  . ADVAIR DISKUS 100-50 MCG/DOSE AEPB inhale 1 dose by mouth twice a day  . albuterol (PROVENTIL HFA;VENTOLIN HFA) 108 (90 BASE) MCG/ACT inhaler Inhale 2 puffs into the lungs every 6 (six) hours as needed.  Marland Kitchen aspirin 81 MG tablet Take 81 mg by mouth daily.  Marland Kitchen estradiol (ESTRACE) 0.1 MG/GM vaginal cream Place 1 Applicatorful vaginally once a week.  . fluticasone  (FLONASE) 50 MCG/ACT nasal spray Place 2 sprays into both nostrils daily.  . hydrochlorothiazide (HYDRODIURIL) 25 MG tablet take 1 tablet by mouth once daily  . loratadine (CLARITIN) 10 MG tablet Take 10 mg by mouth daily.  . rosuvastatin (CRESTOR) 10 MG tablet take 1/2 tablet by mouth once daily  . pramoxine-hydrocortisone (ANALPRAM-HC) 1-1 % rectal cream Place 1 application rectally 2 (two) times daily.   No facility-administered encounter medications on file as of 01/31/2017.     Review of Systems  Constitutional: Negative for appetite change and unexpected weight change.  HENT: Negative for congestion and sinus pressure.   Respiratory: Negative for cough, chest tightness and shortness of breath.   Cardiovascular: Negative for chest pain, palpitations and leg swelling.  Gastrointestinal: Negative for abdominal pain, diarrhea, nausea and vomiting.  Genitourinary: Negative for difficulty urinating and dysuria.  Musculoskeletal: Negative for back pain and joint swelling.  Skin: Negative for color change and rash.  Neurological: Negative for dizziness, light-headedness and headaches.  Psychiatric/Behavioral: Negative for agitation and dysphoric mood.       Objective:    Physical Exam  Constitutional: She appears well-developed and well-nourished. No distress.  HENT:  Nose: Nose normal.  Mouth/Throat: Oropharynx is clear and moist.  Neck: Neck supple. No thyromegaly present.  Cardiovascular: Normal rate and regular rhythm.   Pulmonary/Chest: Breath sounds normal. No respiratory distress. She has no wheezes.  Abdominal: Soft. Bowel sounds are normal. There is no tenderness.  Musculoskeletal: She exhibits no edema or tenderness.  Lymphadenopathy:    She has no cervical adenopathy.  Skin: No rash noted. No erythema.  Psychiatric: She has a normal mood and affect. Her behavior is normal.    BP 128/68 (BP Location: Left Arm, Patient Position: Sitting, Cuff Size: Large)   Pulse 98    Temp 98.7 F (37.1 C) (Oral)   Resp 16   Ht '5\' 3"'  (1.6 m)   Wt 178 lb 9.6 oz (81 kg)   SpO2 97%   BMI 31.64 kg/m  Wt Readings from Last 3 Encounters:  01/31/17 178 lb 9.6 oz (81 kg)  01/18/17 180 lb 6.4 oz (81.8 kg)  10/02/16 180 lb 6.4 oz (81.8 kg)     Lab Results  Component Value Date   WBC 7.4 07/14/2016   HGB 12.9 07/14/2016   HCT 38.4 07/14/2016   PLT 295.0 07/14/2016   GLUCOSE 111 (H) 07/14/2016   CHOL 201 (H) 07/14/2016   TRIG 137.0 07/14/2016   HDL 57.70 07/14/2016   LDLDIRECT 142.7 12/11/2013   LDLCALC 116 (H) 07/14/2016   ALT 15 07/14/2016   AST 19 07/14/2016   NA 138 07/14/2016   K 3.5 07/14/2016   CL 103 07/14/2016   CREATININE 0.78 07/14/2016   BUN 14 07/14/2016   CO2 30 07/14/2016   TSH 1.35 07/14/2016   HGBA1C 5.9 07/14/2016   MICROALBUR 0.2 12/11/2013    Mm Screening Breast Tomo Bilateral  Result Date: 03/01/2016 CLINICAL DATA:  Screening. EXAM: 2D DIGITAL SCREENING BILATERAL MAMMOGRAM WITH CAD AND ADJUNCT TOMO COMPARISON:  Previous exam(s). ACR Breast Density Category b: There are scattered areas of fibroglandular density. FINDINGS: There are no findings suspicious for malignancy. Images were processed with CAD. IMPRESSION: No mammographic evidence of malignancy. A result letter of this screening mammogram will be mailed directly to the patient. RECOMMENDATION: Screening mammogram in one year. (Code:SM-B-01Y) BI-RADS CATEGORY  1: Negative. Electronically Signed   By: Evangeline Dakin M.D.   On: 03/02/2016 17:56       Assessment & Plan:   Problem List Items Addressed This Visit    B12 deficiency    Continue b12 injections.       Hypercholesterolemia    On crestor.  Low cholesterol diet and exercise.  Follow lipid panel and liver function tests.        Relevant Orders   Hepatic function panel   Lipid panel   Hyperglycemia    Low carb diet and exercise.  Follow met b and a1c.        Relevant Orders   Hemoglobin A1c   Hypertension     Blood pressure under good control.  Continue same medication regimen.  Follow pressures.  Follow metabolic panel.        Relevant Orders   Basic metabolic panel   Stress    Handling things well.  Doing well.  Follow.         Other Visit Diagnoses    Rectal lesion    -  Primary   No mass.  no pain.  she is having some irritation.  analpram as directed.  noitfy me  if persistent.         Einar Pheasant, MD

## 2017-01-31 NOTE — Progress Notes (Signed)
Pre-visit discussion using our clinic review tool. No additional management support is needed unless otherwise documented below in the visit note.  

## 2017-02-02 ENCOUNTER — Telehealth: Payer: Self-pay | Admitting: Radiology

## 2017-02-02 NOTE — Telephone Encounter (Signed)
Pt coming in for labs tomorrow, please place future orders, Thank you

## 2017-02-04 ENCOUNTER — Encounter: Payer: Self-pay | Admitting: Internal Medicine

## 2017-02-04 NOTE — Assessment & Plan Note (Signed)
On crestor.  Low cholesterol diet and exercise.  Follow lipid panel and liver function tests.   

## 2017-02-04 NOTE — Assessment & Plan Note (Signed)
Low carb diet and exercise.  Follow met b and a1c.   

## 2017-02-04 NOTE — Assessment & Plan Note (Signed)
Continue b12 injections.  

## 2017-02-04 NOTE — Assessment & Plan Note (Signed)
Blood pressure under good control.  Continue same medication regimen.  Follow pressures.  Follow metabolic panel.   

## 2017-02-04 NOTE — Assessment & Plan Note (Signed)
Handling things well.  Doing well.  Follow.

## 2017-02-05 ENCOUNTER — Other Ambulatory Visit (INDEPENDENT_AMBULATORY_CARE_PROVIDER_SITE_OTHER): Payer: Medicare Other

## 2017-02-05 DIAGNOSIS — R739 Hyperglycemia, unspecified: Secondary | ICD-10-CM | POA: Diagnosis not present

## 2017-02-05 DIAGNOSIS — I10 Essential (primary) hypertension: Secondary | ICD-10-CM

## 2017-02-05 DIAGNOSIS — E7800 Pure hypercholesterolemia, unspecified: Secondary | ICD-10-CM | POA: Diagnosis not present

## 2017-02-05 LAB — LIPID PANEL
Cholesterol: 192 mg/dL (ref 0–200)
HDL: 52.8 mg/dL (ref >39.00)
LDL Cholesterol: 120 mg/dL — ABNORMAL HIGH (ref 0–99)
NONHDL: 139.63
Total CHOL/HDL Ratio: 4
Triglycerides: 100 mg/dL (ref 0.0–149.0)
VLDL: 20 mg/dL (ref 0.0–40.0)

## 2017-02-05 LAB — HEPATIC FUNCTION PANEL
ALK PHOS: 68 U/L (ref 39–117)
ALT: 11 U/L (ref 0–35)
AST: 16 U/L (ref 0–37)
Albumin: 4.1 g/dL (ref 3.5–5.2)
BILIRUBIN DIRECT: 0.1 mg/dL (ref 0.0–0.3)
BILIRUBIN TOTAL: 0.5 mg/dL (ref 0.2–1.2)
Total Protein: 7.2 g/dL (ref 6.0–8.3)

## 2017-02-05 LAB — HEMOGLOBIN A1C: HEMOGLOBIN A1C: 6.1 % (ref 4.6–6.5)

## 2017-02-05 LAB — BASIC METABOLIC PANEL
BUN: 13 mg/dL (ref 6–23)
CALCIUM: 9.7 mg/dL (ref 8.4–10.5)
CO2: 29 meq/L (ref 19–32)
CREATININE: 0.78 mg/dL (ref 0.40–1.20)
Chloride: 103 mEq/L (ref 96–112)
GFR: 93.18 mL/min (ref >60.00)
Glucose, Bld: 111 mg/dL — ABNORMAL HIGH (ref 70–99)
Potassium: 3.8 mEq/L (ref 3.5–5.1)
Sodium: 140 mEq/L (ref 135–145)

## 2017-02-05 NOTE — Telephone Encounter (Signed)
Orders in 

## 2017-02-06 ENCOUNTER — Telehealth: Payer: Self-pay

## 2017-02-06 NOTE — Telephone Encounter (Signed)
Pt called returning your call. Thank you!   Call pt @ [phone].

## 2017-02-06 NOTE — Telephone Encounter (Signed)
-----   Message from Dale Durhamharlene [name], MD sent at 02/06/2017  5:04 AM EST ----- Notify pt that her cholesterol levels are relatively stable when compared to previous check.  Overall sugar control slightly increased.  I recommend a low carb diet and exercise.  We will follow.  Kidney function tests and liver function tests are wnl.

## 2017-02-06 NOTE — Telephone Encounter (Signed)
lmovm to call office

## 2017-02-06 NOTE — Telephone Encounter (Signed)
PA completed on Cover my meds for Hydrocort-Pramoxine

## 2017-02-07 NOTE — Telephone Encounter (Signed)
lmtrc

## 2017-02-07 NOTE — Telephone Encounter (Signed)
Pt informed did not have any questions

## 2017-02-10 ENCOUNTER — Other Ambulatory Visit (HOSPITAL_COMMUNITY)
Admission: RE | Admit: 2017-02-10 | Discharge: 2017-02-10 | Disposition: A | Discharge status: Home / Self Care | Payer: Medicare Other | Source: Other Acute Inpatient Hospital | Attending: Family | Admitting: Family

## 2017-02-10 ENCOUNTER — Encounter: Payer: Self-pay | Admitting: Family

## 2017-02-10 ENCOUNTER — Ambulatory Visit (INDEPENDENT_AMBULATORY_CARE_PROVIDER_SITE_OTHER): Payer: Medicare Other | Admitting: Family

## 2017-02-10 VITALS — BP 132/82 | HR 72 | Temp 98.8°F | Ht 63.0 in | Wt 178.2 lb

## 2017-02-10 DIAGNOSIS — R300 Dysuria: Secondary | ICD-10-CM | POA: Diagnosis present

## 2017-02-10 LAB — POC URINALSYSI DIPSTICK (AUTOMATED)
Bilirubin, UA: NEGATIVE
Glucose, UA: NEGATIVE
Ketones, UA: NEGATIVE
Nitrite, UA: NEGATIVE
PROTEIN UA: POSITIVE
RBC UA: POSITIVE
Spec Grav, UA: 1.01
UROBILINOGEN UA: 1
pH, UA: 8

## 2017-02-10 NOTE — Progress Notes (Signed)
Pre visit review using our clinic review tool, if applicable. No additional management support is needed unless otherwise documented below in the visit note. 

## 2017-02-10 NOTE — Patient Instructions (Signed)
Lets await urine culture  [place]  May start Azo for bladder pain  If there is no improvement in your symptoms, or if there is any worsening of symptoms, or if you have any additional concerns, please return for re-evaluation; or, if we are closed, consider going to the Emergency Room for evaluation if symptoms urgent.

## 2017-02-10 NOTE — Progress Notes (Signed)
Subjective:    Patient ID: Lisa Robles, female    DOB: May 12, 1946, 71 y.o.   MRN: [id number]  CC: Lisa Robles is a 71 y.o. female who presents today for an acute visit.    HPI: CC: dysuria x 6 days, worsening. No fever, flank pain. Endorses blood in urine. NO h/o renal stones.   No vaginal itching or changes in vaginal discharge.   Last UTI 09/2017.      HISTORY:  Past Medical History:  Diagnosis Date  . GERD (gastroesophageal reflux disease)   . Hypercholesteremia   . Hypertension    Past Surgical History:  Procedure Laterality Date  . TOOTH EXTRACTION     tooth implantation  . TUBAL LIGATION  1979   Family History  Problem Relation Age of Onset  . Hypercholesterolemia Brother   . Cancer Maternal Aunt     breast  . Colon cancer Neg Hx   . Breast cancer Neg Hx     Allergies: Cinnamon and Shellfish allergy Current Outpatient Prescriptions on File Prior to Visit  Medication Sig Dispense Refill  . ADVAIR DISKUS 100-50 MCG/DOSE AEPB inhale 1 dose by mouth twice a day 60 each 2  . albuterol (PROVENTIL HFA;VENTOLIN HFA) 108 (90 BASE) MCG/ACT inhaler Inhale 2 puffs into the lungs every 6 (six) hours as needed. 6.7 g 2  . aspirin 81 MG tablet Take 81 mg by mouth daily.    Marland Kitchen estradiol (ESTRACE) 0.1 MG/GM vaginal cream Place 1 Applicatorful vaginally once a week.    . fluticasone (FLONASE) 50 MCG/ACT nasal spray Place 2 sprays into both nostrils daily. 16 g 1  . hydrochlorothiazide (HYDRODIURIL) 25 MG tablet take 1 tablet by mouth once daily 90 tablet 0  . loratadine (CLARITIN) 10 MG tablet Take 10 mg by mouth daily.    . pramoxine-hydrocortisone (ANALPRAM-HC) 1-1 % rectal cream Place 1 application rectally 2 (two) times daily. 30 g 0  . rosuvastatin (CRESTOR) 10 MG tablet take 1/2 tablet by mouth once daily 45 tablet 0   No current facility-administered medications on file prior to visit.     Social History  Substance Use Topics  . Smoking status: Former  Smoker    Quit date: 12/04/1977  . Smokeless tobacco: Never Used  . Alcohol use No    Review of Systems  Constitutional: Negative for chills and fever.  Respiratory: Negative for cough.   Cardiovascular: Negative for chest pain and palpitations.  Gastrointestinal: Negative for abdominal pain, nausea and vomiting.  Genitourinary: Positive for dysuria and hematuria. Negative for flank pain and vaginal discharge.      Objective:    BP 132/82 (BP Location: Left Arm, Patient Position: Sitting, Cuff Size: Large)   Pulse 72   Temp 98.8 F (37.1 C) (Oral)   Ht 5\' 3"  (1.6 m)   Wt 178 lb 3 oz (80.8 kg)   SpO2 96%   BMI 31.56 kg/m    Physical Exam  Constitutional: She appears well-developed and well-nourished.  Cardiovascular: Normal rate, regular rhythm, normal heart sounds and normal pulses.   Pulmonary/Chest: Effort normal and breath sounds normal. She has no wheezes. She has no rhonchi. She has no rales.  Abdominal: There is no CVA tenderness.  Neurological: She is alert.  Skin: Skin is warm and dry.  Psychiatric: She has a normal mood and affect. Her speech is normal and behavior is normal. Thought content normal.  Vitals reviewed.      Assessment & Plan:  1. Dysuria UA positive for blood, negative nitrites. Jointly decided to wait on culture prior to treatment. Advised OTC Azo. Return precautions given.   - POCT Urinalysis Dipstick (Automated)    I am having Lisa Robles maintain her aspirin, fluticasone, albuterol, estradiol, ADVAIR DISKUS, loratadine, rosuvastatin, hydrochlorothiazide, pramoxine-hydrocortisone, and guaiFENesin.   Meds ordered this encounter  Medications  . guaiFENesin (MUCINEX) 600 MG 12 hr tablet    Sig: Take by mouth 2 (two) times daily.    Return precautions given.   Risks, benefits, and alternatives of the medications and treatment plan prescribed today were discussed, and patient expressed understanding.   Education regarding symptom  management and diagnosis given to patient on AVS.  Continue to follow with Dale DurhamSCOTT, CHARLENE, MD for routine health maintenance.   Lisa MillerGracetta W Robles and I agreed with plan.   Lisa PlowmanMargaret Dakota Stangl, FNP

## 2017-02-12 ENCOUNTER — Other Ambulatory Visit: Payer: Self-pay | Admitting: Family

## 2017-02-12 ENCOUNTER — Telehealth: Payer: Self-pay | Admitting: *Deleted

## 2017-02-12 DIAGNOSIS — N3000 Acute cystitis without hematuria: Secondary | ICD-10-CM

## 2017-02-12 LAB — URINE CULTURE

## 2017-02-12 MED ORDER — CEFDINIR 300 MG PO CAPS: 300.0000 mg | ORAL_CAPSULE | Freq: Two times a day (BID) | ORAL | 0 refills | Status: AC

## 2017-02-12 NOTE — Telephone Encounter (Signed)
Pt requested urine culture results  Pt contact [phone]

## 2017-02-12 NOTE — Telephone Encounter (Signed)
Please advise 

## 2017-03-02 ENCOUNTER — Ambulatory Visit
Admission: RE | Admit: 2017-03-02 | Discharge: 2017-03-02 | Disposition: A | Discharge status: Home / Self Care | Payer: Medicare Other | Source: Ambulatory Visit | Attending: Internal Medicine | Admitting: Internal Medicine

## 2017-03-02 DIAGNOSIS — Z1231 Encounter for screening mammogram for malignant neoplasm of breast: Secondary | ICD-10-CM

## 2017-03-02 LAB — HM MAMMOGRAPHY

## 2017-03-27 ENCOUNTER — Ambulatory Visit (INDEPENDENT_AMBULATORY_CARE_PROVIDER_SITE_OTHER): Payer: Medicare Other | Admitting: Internal Medicine

## 2017-03-27 ENCOUNTER — Encounter: Payer: Self-pay | Admitting: Internal Medicine

## 2017-03-27 VITALS — BP 128/76 | HR 61 | Temp 98.4°F | Resp 12 | Ht 63.0 in | Wt 178.8 lb

## 2017-03-27 DIAGNOSIS — E538 Deficiency of other specified B group vitamins: Secondary | ICD-10-CM | POA: Diagnosis not present

## 2017-03-27 DIAGNOSIS — R739 Hyperglycemia, unspecified: Secondary | ICD-10-CM | POA: Diagnosis not present

## 2017-03-27 DIAGNOSIS — E7800 Pure hypercholesterolemia, unspecified: Secondary | ICD-10-CM | POA: Diagnosis not present

## 2017-03-27 DIAGNOSIS — I10 Essential (primary) hypertension: Secondary | ICD-10-CM

## 2017-03-27 MED ORDER — HYDROCHLOROTHIAZIDE 25 MG PO TABS: 25.0000 mg | ORAL_TABLET | Freq: Every day | ORAL | 1 refills | Status: DC

## 2017-03-27 MED ORDER — ROSUVASTATIN CALCIUM 10 MG PO TABS: 5.0000 mg | ORAL_TABLET | Freq: Every day | ORAL | 1 refills | Status: DC

## 2017-03-27 MED ORDER — CYANOCOBALAMIN 1000 MCG/ML IJ SOLN
1000.0000 ug | Freq: Once | INTRAMUSCULAR | Status: AC
  Administered 2017-03-27: 1000 ug via INTRAMUSCULAR

## 2017-03-27 NOTE — Progress Notes (Signed)
Patient ID: Lisa Robles, female   DOB: 1946/06/11, 71 y.o.   MRN: [id number]   Subjective:    Patient ID: Lisa Robles, female    DOB: 26-Aug-1946, 71 y.o.   MRN: [id number]  HPI  Patient here for a scheduled follow up.  She reports she is doing well.  Trying to stay active.  No chest pain.  No sob.  No acid reflux.  No abdominal pain.  Bowels moving.     Past Medical History:  Diagnosis Date  . GERD (gastroesophageal reflux disease)   . Hypercholesteremia   . Hypertension    Past Surgical History:  Procedure Laterality Date  . BREAST BIOPSY    . TOOTH EXTRACTION     tooth implantation  . TUBAL LIGATION  1979   Family History  Problem Relation Age of Onset  . Hypercholesterolemia Brother   . Cancer Maternal Aunt     breast  . Colon cancer Neg Hx   . Breast cancer Neg Hx    Social History   Social History  . Marital status: Widowed    Spouse name: N/A  . Number of children: 2  . Years of education: masters   Occupational History  . retired Pharmacist, hospital    Social History Main Topics  . Smoking status: Former Smoker    Quit date: 12/04/1977  . Smokeless tobacco: Never Used  . Alcohol use No  . Drug use: No  . Sexual activity: Yes     Comment: Is sexually active wih same partner x 14 years. Recently found out partner has been cheating.   Other Topics Concern  . None   Social History Narrative  . None    Outpatient Encounter Prescriptions as of 03/27/2017  Medication Sig  . ADVAIR DISKUS 100-50 MCG/DOSE AEPB inhale 1 dose by mouth twice a day  . albuterol (PROVENTIL HFA;VENTOLIN HFA) 108 (90 BASE) MCG/ACT inhaler Inhale 2 puffs into the lungs every 6 (six) hours as needed.  Marland Kitchen aspirin 81 MG tablet Take 81 mg by mouth daily.  Marland Kitchen estradiol (ESTRACE) 0.1 MG/GM vaginal cream Place 1 Applicatorful vaginally once a week.  . fluticasone (FLONASE) 50 MCG/ACT nasal spray Place 2 sprays into both nostrils daily.  Marland Kitchen guaiFENesin (MUCINEX) 600 MG 12 hr tablet Take by  mouth 2 (two) times daily.  . hydrochlorothiazide (HYDRODIURIL) 25 MG tablet Take 1 tablet (25 mg total) by mouth daily.  Marland Kitchen loratadine (CLARITIN) 10 MG tablet Take 10 mg by mouth daily.  . rosuvastatin (CRESTOR) 10 MG tablet Take 0.5 tablets (5 mg total) by mouth daily.  . [DISCONTINUED] hydrochlorothiazide (HYDRODIURIL) 25 MG tablet take 1 tablet by mouth once daily  . [DISCONTINUED] rosuvastatin (CRESTOR) 10 MG tablet take 1/2 tablet by mouth once daily  . pramoxine-hydrocortisone (ANALPRAM-HC) 1-1 % rectal cream Place 1 application rectally 2 (two) times daily. (Patient not taking: Reported on 03/27/2017)  . [EXPIRED] cyanocobalamin ((VITAMIN B-12)) injection 1,000 mcg    No facility-administered encounter medications on file as of 03/27/2017.     Review of Systems  Constitutional: Negative for appetite change and unexpected weight change.  HENT: Negative for congestion and sinus pressure.   Respiratory: Negative for cough, chest tightness and shortness of breath.   Cardiovascular: Negative for chest pain, palpitations and leg swelling.  Gastrointestinal: Negative for abdominal pain, diarrhea, nausea and vomiting.  Genitourinary: Negative for difficulty urinating and dysuria.  Musculoskeletal: Negative for back pain and joint swelling.  Skin: Negative for color change and rash.  Neurological: Negative for dizziness, light-headedness and headaches.  Psychiatric/Behavioral: Negative for agitation and dysphoric mood.       Objective:    Physical Exam  Constitutional: She appears well-developed and well-nourished. No distress.  HENT:  Nose: Nose normal.  Mouth/Throat: Oropharynx is clear and moist.  Neck: Neck supple. No thyromegaly present.  Cardiovascular: Normal rate and regular rhythm.   Pulmonary/Chest: Breath sounds normal. No respiratory distress. She has no wheezes.  Abdominal: Soft. Bowel sounds are normal. There is no tenderness.  Musculoskeletal: She exhibits no edema or  tenderness.  Lymphadenopathy:    She has no cervical adenopathy.  Skin: No rash noted. No erythema.  Psychiatric: She has a normal mood and affect. Her behavior is normal.    BP 128/76 (BP Location: Left Arm, Patient Position: Sitting, Cuff Size: Normal)   Pulse 61   Temp 98.4 F (36.9 C) (Oral)   Resp 12   Ht _0  (1.6 m)   Wt 178 lb 12.8 oz (81.1 kg)   SpO2 98%   BMI 31.67 kg/m  Wt Readings from Last 3 Encounters:  03/27/17 178 lb 12.8 oz (81.1 kg)  02/10/17 178 lb 3 oz (80.8 kg)  01/31/17 178 lb 9.6 oz (81 kg)     Lab Results  Component Value Date   WBC 7.4 07/14/2016   HGB 12.9 07/14/2016   HCT 38.4 07/14/2016   PLT 295.0 07/14/2016   GLUCOSE 111 (H) 02/05/2017   CHOL 192 02/05/2017   TRIG 100.0 02/05/2017   HDL 52.80 02/05/2017   LDLDIRECT 142.7 12/11/2013   LDLCALC 120 (H) 02/05/2017   ALT 11 02/05/2017   AST 16 02/05/2017   NA 140 02/05/2017   K 3.8 02/05/2017   CL 103 02/05/2017   CREATININE 0.78 02/05/2017   BUN 13 02/05/2017   CO2 29 02/05/2017   TSH 1.35 07/14/2016   HGBA1C 6.1 02/05/2017   MICROALBUR 0.2 12/11/2013    Mm Screening Breast Tomo Bilateral  Result Date: 03/02/2017 CLINICAL DATA:  Screening. EXAM: 2D DIGITAL SCREENING BILATERAL MAMMOGRAM WITH CAD AND ADJUNCT TOMO COMPARISON:  Previous exam(s). ACR Breast Density Category b: There are scattered areas of fibroglandular density. FINDINGS: There are no findings suspicious for malignancy. Images were processed with CAD. IMPRESSION: No mammographic evidence of malignancy. A result letter of this screening mammogram will be mailed directly to the patient. RECOMMENDATION: Screening mammogram in one year. (Code:SM-B-01Y) BI-RADS CATEGORY  1: Negative. Electronically Signed   By: Lovey Newcomer M.D.   On: 03/02/2017 16:54       Assessment & Plan:   Problem List Items Addressed This Visit    B12 deficiency - Primary   Relevant Medications   cyanocobalamin ((VITAMIN B-12)) injection 1,000 mcg  (Completed)   Hypercholesterolemia    On crestor.  Low cholesterol diet and exercise.  Follow lipid panel and liver function tests.        Relevant Medications   hydrochlorothiazide (HYDRODIURIL) 25 MG tablet   rosuvastatin (CRESTOR) 10 MG tablet   Other Relevant Orders   Hepatic function panel   Lipid panel   Basic metabolic panel   Hyperglycemia    Low carb diet and exercise.  Follow met b and a1c.        Relevant Orders   Hemoglobin A1c   Hypertension    Blood pressure under good control.  Continue same medication regimen.  Follow pressures.  Follow metabolic panel.        Relevant Medications   hydrochlorothiazide (HYDRODIURIL)  25 MG tablet   rosuvastatin (CRESTOR) 10 MG tablet       Einar Pheasant, MD

## 2017-03-27 NOTE — Progress Notes (Signed)
Pre-visit discussion using our clinic review tool. No additional management support is needed unless otherwise documented below in the visit note.  

## 2017-04-02 ENCOUNTER — Encounter: Payer: Self-pay | Admitting: Internal Medicine

## 2017-04-02 NOTE — Assessment & Plan Note (Signed)
On crestor.  Low cholesterol diet and exercise.  Follow lipid panel and liver function tests.   

## 2017-04-02 NOTE — Assessment & Plan Note (Signed)
Blood pressure under good control.  Continue same medication regimen.  Follow pressures.  Follow metabolic panel.   

## 2017-04-02 NOTE — Assessment & Plan Note (Signed)
Low carb diet and exercise.  Follow met b and a1c.   

## 2017-05-02 ENCOUNTER — Telehealth: Payer: Self-pay

## 2017-05-02 NOTE — Telephone Encounter (Signed)
Called patient she did not want to have cologuard done at this time. That she is not having any symptoms at this time. Will call if anything changes. Did ask for B-12 I have made that app with patient. Order sent to shred.

## 2017-05-02 NOTE — Telephone Encounter (Signed)
Called patient l/m to call office. Wanted to see if she would like cologuard. If so order in my red folder.

## 2017-05-02 NOTE — Telephone Encounter (Signed)
Pt returned phone call to office. She does not want to do the cologuard.

## 2017-05-08 ENCOUNTER — Ambulatory Visit (INDEPENDENT_AMBULATORY_CARE_PROVIDER_SITE_OTHER): Payer: Medicare Other | Admitting: *Deleted

## 2017-05-08 VITALS — Ht 63.0 in | Wt 178.8 lb

## 2017-05-08 DIAGNOSIS — E538 Deficiency of other specified B group vitamins: Secondary | ICD-10-CM | POA: Diagnosis not present

## 2017-05-08 MED ORDER — CYANOCOBALAMIN 1000 MCG/ML IJ SOLN
1000.0000 ug | Freq: Once | INTRAMUSCULAR | Status: AC
  Administered 2017-05-08: 1000 ug via INTRAMUSCULAR

## 2017-05-08 NOTE — Progress Notes (Addendum)
Patient  Presented for B 12 injection to left deltoid, patient voiced no complaints or concerns nor showed any signs of distress during injection.  Reviewed.  Dr Lorin PicketScott

## 2017-07-02 ENCOUNTER — Other Ambulatory Visit (INDEPENDENT_AMBULATORY_CARE_PROVIDER_SITE_OTHER): Payer: Medicare Other

## 2017-07-02 DIAGNOSIS — R739 Hyperglycemia, unspecified: Secondary | ICD-10-CM

## 2017-07-02 DIAGNOSIS — E7800 Pure hypercholesterolemia, unspecified: Secondary | ICD-10-CM | POA: Diagnosis not present

## 2017-07-02 LAB — HEPATIC FUNCTION PANEL
ALBUMIN: 3.9 g/dL (ref 3.5–5.2)
ALT: 11 U/L (ref 0–35)
AST: 16 U/L (ref 0–37)
Alkaline Phosphatase: 59 U/L (ref 39–117)
Bilirubin, Direct: 0 mg/dL (ref 0.0–0.3)
TOTAL PROTEIN: 7.5 g/dL (ref 6.0–8.3)
Total Bilirubin: 0.5 mg/dL (ref 0.2–1.2)

## 2017-07-02 LAB — LIPID PANEL
CHOLESTEROL: 180 mg/dL (ref 0–200)
HDL: 50.6 mg/dL (ref >39.00)
LDL Cholesterol: 111 mg/dL — ABNORMAL HIGH (ref 0–99)
NonHDL: 129.65
TRIGLYCERIDES: 91 mg/dL (ref 0.0–149.0)
Total CHOL/HDL Ratio: 4
VLDL: 18.2 mg/dL (ref 0.0–40.0)

## 2017-07-02 LAB — BASIC METABOLIC PANEL
BUN: 15 mg/dL (ref 6–23)
CO2: 29 mEq/L (ref 19–32)
Calcium: 9.5 mg/dL (ref 8.4–10.5)
Chloride: 102 mEq/L (ref 96–112)
Creatinine, Ser: 0.75 mg/dL (ref 0.40–1.20)
GFR: 97.39 mL/min (ref >60.00)
GLUCOSE: 113 mg/dL — AB (ref 70–99)
POTASSIUM: 3.5 meq/L (ref 3.5–5.1)
Sodium: 138 mEq/L (ref 135–145)

## 2017-07-02 LAB — HEMOGLOBIN A1C: Hgb A1c MFr Bld: 6.1 % (ref 4.6–6.5)

## 2017-07-06 ENCOUNTER — Encounter: Payer: Self-pay | Admitting: Internal Medicine

## 2017-07-06 ENCOUNTER — Ambulatory Visit (INDEPENDENT_AMBULATORY_CARE_PROVIDER_SITE_OTHER): Payer: Medicare Other | Admitting: Internal Medicine

## 2017-07-06 VITALS — BP 126/76 | HR 78 | Temp 98.7°F | Resp 12 | Ht 63.0 in | Wt 179.2 lb

## 2017-07-06 DIAGNOSIS — E538 Deficiency of other specified B group vitamins: Secondary | ICD-10-CM | POA: Diagnosis not present

## 2017-07-06 DIAGNOSIS — I10 Essential (primary) hypertension: Secondary | ICD-10-CM | POA: Diagnosis not present

## 2017-07-06 DIAGNOSIS — R87619 Unspecified abnormal cytological findings in specimens from cervix uteri: Secondary | ICD-10-CM | POA: Diagnosis not present

## 2017-07-06 DIAGNOSIS — E7800 Pure hypercholesterolemia, unspecified: Secondary | ICD-10-CM | POA: Diagnosis not present

## 2017-07-06 DIAGNOSIS — Z0000 Encounter for general adult medical examination without abnormal findings: Secondary | ICD-10-CM | POA: Diagnosis not present

## 2017-07-06 DIAGNOSIS — R739 Hyperglycemia, unspecified: Secondary | ICD-10-CM | POA: Diagnosis not present

## 2017-07-06 MED ORDER — CYANOCOBALAMIN 1000 MCG/ML IJ SOLN
1000.0000 ug | Freq: Once | INTRAMUSCULAR | Status: AC
  Administered 2017-07-06: 1000 ug via INTRAMUSCULAR

## 2017-07-06 NOTE — Progress Notes (Signed)
Pre-visit discussion using our clinic review tool. No additional management support is needed unless otherwise documented below in the visit note.  

## 2017-07-06 NOTE — Assessment & Plan Note (Signed)
Physical today 07/06/17.  PAP has been followed by gyn.  Mammogram 03/02/17 - birads I.  Colonoscopy 09/04/11 - diverticulosis otherwise normal.  Recommended f/u in 10 years.

## 2017-07-06 NOTE — Progress Notes (Signed)
Patient ID: Lisa Robles, female   DOB: 09-25-1946, 71 y.o.   MRN: [id number]   Subjective:    Patient ID: Lisa Robles, female    DOB: Apr 29, 1946, 71 y.o.   MRN: [id number]  HPI  Patient here for her physical exam.  She reports she is doing well.  Feels good.  Stays active.  No chest pain.  No sob.  No acid reflux. No abdominal pain.  Bowels moving.  Some itching.  Mostly notices when she lies down.  No rash.  Will itch on her head, shoulder and various placed.  Not all at the same time.  No rash.  No itching on arms or legs.  Takes benadryl and this relieves the itching.   No new contacts or exposures.  No fever.  No vaginal itching or irritation.  Some dryness.  Overdue f/u with gyn.    Past Medical History:  Diagnosis Date  . GERD (gastroesophageal reflux disease)   . Hypercholesteremia   . Hypertension    Past Surgical History:  Procedure Laterality Date  . BREAST BIOPSY    . TOOTH EXTRACTION     tooth implantation  . TUBAL LIGATION  1979   Family History  Problem Relation Age of Onset  . Hypercholesterolemia Brother   . Cancer Maternal Aunt        breast  . Colon cancer Neg Hx   . Breast cancer Neg Hx    Social History   Social History  . Marital status: Widowed    Spouse name: N/A  . Number of children: 2  . Years of education: masters   Occupational History  . retired Pharmacist, hospital    Social History Main Topics  . Smoking status: Former Smoker    Quit date: 12/04/1977  . Smokeless tobacco: Never Used  . Alcohol use No  . Drug use: No  . Sexual activity: Yes     Comment: Is sexually active wih same partner x 14 years. Recently found out partner has been cheating.   Other Topics Concern  . None   Social History Narrative  . None    Outpatient Encounter Prescriptions as of 07/06/2017  Medication Sig  . ADVAIR DISKUS 100-50 MCG/DOSE AEPB inhale 1 dose by mouth twice a day  . albuterol (PROVENTIL HFA;VENTOLIN HFA) 108 (90 BASE) MCG/ACT inhaler Inhale 2  puffs into the lungs every 6 (six) hours as needed.  Marland Kitchen aspirin 81 MG tablet Take 81 mg by mouth daily.  . fluticasone (FLONASE) 50 MCG/ACT nasal spray Place 2 sprays into both nostrils daily.  Marland Kitchen guaiFENesin (MUCINEX) 600 MG 12 hr tablet Take by mouth 2 (two) times daily.  . hydrochlorothiazide (HYDRODIURIL) 25 MG tablet Take 1 tablet (25 mg total) by mouth daily.  Marland Kitchen loratadine (CLARITIN) 10 MG tablet Take 10 mg by mouth daily.  . pramoxine-hydrocortisone (ANALPRAM-HC) 1-1 % rectal cream Place 1 application rectally 2 (two) times daily.  . rosuvastatin (CRESTOR) 10 MG tablet Take 0.5 tablets (5 mg total) by mouth daily.  Marland Kitchen estradiol (ESTRACE) 0.1 MG/GM vaginal cream Place 1 Applicatorful vaginally once a week.  . [EXPIRED] cyanocobalamin ((VITAMIN B-12)) injection 1,000 mcg    No facility-administered encounter medications on file as of 07/06/2017.     Review of Systems  Constitutional: Negative for appetite change and unexpected weight change.  HENT: Negative for congestion and sinus pressure.   Eyes: Negative for pain and visual disturbance.  Respiratory: Negative for cough, chest tightness and shortness of breath.  Cardiovascular: Negative for chest pain, palpitations and leg swelling.  Gastrointestinal: Negative for abdominal pain, diarrhea, nausea and vomiting.  Genitourinary: Negative for difficulty urinating and dysuria.  Musculoskeletal: Negative for back pain and joint swelling.  Skin: Negative for color change and rash.       Itching as outlined.  No rash.    Neurological: Negative for dizziness and headaches.  Hematological: Negative for adenopathy. Does not bruise/bleed easily.  Psychiatric/Behavioral: Negative for agitation and dysphoric mood.       Objective:     Blood pressure rechecked by me:  122/78  Physical Exam  Constitutional: She is oriented to person, place, and time. She appears well-developed and well-nourished. No distress.  HENT:  Nose: Nose normal.    Mouth/Throat: Oropharynx is clear and moist.  Eyes: Right eye exhibits no discharge. Left eye exhibits no discharge. No scleral icterus.  Neck: Neck supple. No thyromegaly present.  Cardiovascular: Normal rate and regular rhythm.   Pulmonary/Chest: Breath sounds normal. No accessory muscle usage. No tachypnea. No respiratory distress. She has no decreased breath sounds. She has no wheezes. She has no rhonchi. Right breast exhibits no inverted nipple, no mass, no nipple discharge and no tenderness (no axillary adenopathy). Left breast exhibits no inverted nipple, no mass, no nipple discharge and no tenderness (no axilarry adenopathy).  Abdominal: Soft. Bowel sounds are normal. There is no tenderness.  Musculoskeletal: She exhibits no edema or tenderness.  Lymphadenopathy:    She has no cervical adenopathy.  Neurological: She is alert and oriented to person, place, and time.  Skin: Skin is warm. No rash noted. No erythema.  Psychiatric: She has a normal mood and affect. Her behavior is normal.    BP 126/76 (BP Location: Left Arm, Patient Position: Sitting, Cuff Size: Normal)   Pulse 78   Temp 98.7 F (37.1 C) (Oral)   Resp 12   Ht '5\' 3"'  (1.6 m)   Wt 179 lb 3.2 oz (81.3 kg)   SpO2 96%   BMI 31.74 kg/m  Wt Readings from Last 3 Encounters:  07/06/17 179 lb 3.2 oz (81.3 kg)  05/08/17 178 lb 12 oz (81.1 kg)  03/27/17 178 lb 12.8 oz (81.1 kg)     Lab Results  Component Value Date   WBC 7.4 07/14/2016   HGB 12.9 07/14/2016   HCT 38.4 07/14/2016   PLT 295.0 07/14/2016   GLUCOSE 113 (H) 07/02/2017   CHOL 180 07/02/2017   TRIG 91.0 07/02/2017   HDL 50.60 07/02/2017   LDLDIRECT 142.7 12/11/2013   LDLCALC 111 (H) 07/02/2017   ALT 11 07/02/2017   AST 16 07/02/2017   NA 138 07/02/2017   K 3.5 07/02/2017   CL 102 07/02/2017   CREATININE 0.75 07/02/2017   BUN 15 07/02/2017   CO2 29 07/02/2017   TSH 1.35 07/14/2016   HGBA1C 6.1 07/02/2017   MICROALBUR 0.2 12/11/2013    Mm  Screening Breast Tomo Bilateral  Result Date: 03/02/2017 CLINICAL DATA:  Screening. EXAM: 2D DIGITAL SCREENING BILATERAL MAMMOGRAM WITH CAD AND ADJUNCT TOMO COMPARISON:  Previous exam(s). ACR Breast Density Category b: There are scattered areas of fibroglandular density. FINDINGS: There are no findings suspicious for malignancy. Images were processed with CAD. IMPRESSION: No mammographic evidence of malignancy. A result letter of this screening mammogram will be mailed directly to the patient. RECOMMENDATION: Screening mammogram in one year. (Code:SM-B-01Y) BI-RADS CATEGORY  1: Negative. Electronically Signed   By: Lovey Newcomer M.D.   On: 03/02/2017 16:54  Assessment & Plan:   Problem List Items Addressed This Visit    Abnormal Pap smear of cervix    Sees gyn.  Discussed with her today.  Offered pap.  She declines.  Wants to f/u with gyn.  Scheduled f/u.  She is overdue.        Relevant Orders   Ambulatory referral to Gynecology   B12 deficiency    Continue B12 injections.        Relevant Medications   cyanocobalamin ((VITAMIN B-12)) injection 1,000 mcg (Completed)   Health care maintenance    Physical today 07/06/17.  PAP has been followed by gyn.  Mammogram 03/02/17 - birads I.  Colonoscopy 09/04/11 - diverticulosis otherwise normal.  Recommended f/u in 10 years.        Hypercholesterolemia    On crestor.  Low cholesterol diet and exercise.  Follow lipid panel and liver function tests.   Lab Results  Component Value Date   CHOL 180 07/02/2017   HDL 50.60 07/02/2017   LDLCALC 111 (H) 07/02/2017   LDLDIRECT 142.7 12/11/2013   TRIG 91.0 07/02/2017   CHOLHDL 4 07/02/2017        Relevant Orders   Hepatic function panel   Lipid panel   Hyperglycemia    Low carb diet and exercise.  Follow met b and a1c.        Relevant Orders   Hemoglobin A1c   Hypertension    Blood pressure under good control.  Continue same medication regimen.  Follow pressures.  Follow metabolic panel.          Relevant Orders   CBC with Differential/Platelet   TSH   Basic metabolic panel    Other Visit Diagnoses    Routine general medical examination at a health care facility    -  Primary       Einar Pheasant, MD

## 2017-07-08 ENCOUNTER — Encounter: Payer: Self-pay | Admitting: Internal Medicine

## 2017-07-08 NOTE — Assessment & Plan Note (Signed)
Sees gyn.  Discussed with her today.  Offered pap.  She declines.  Wants to f/u with gyn.  Scheduled f/u.  She is overdue.

## 2017-07-08 NOTE — Assessment & Plan Note (Signed)
Blood pressure under good control.  Continue same medication regimen.  Follow pressures.  Follow metabolic panel.   

## 2017-07-08 NOTE — Assessment & Plan Note (Signed)
Continue B12 injections.   

## 2017-07-08 NOTE — Assessment & Plan Note (Signed)
On crestor.  Low cholesterol diet and exercise.  Follow lipid panel and liver function tests.   Lab Results  Component Value Date   CHOL 180 07/02/2017   HDL 50.60 07/02/2017   LDLCALC 111 (H) 07/02/2017   LDLDIRECT 142.7 12/11/2013   TRIG 91.0 07/02/2017   CHOLHDL 4 07/02/2017

## 2017-07-08 NOTE — Assessment & Plan Note (Signed)
Low carb diet and exercise.  Follow met b and a1c.   

## 2017-08-14 ENCOUNTER — Ambulatory Visit (INDEPENDENT_AMBULATORY_CARE_PROVIDER_SITE_OTHER): Payer: Medicare Other | Admitting: *Deleted

## 2017-08-14 DIAGNOSIS — E538 Deficiency of other specified B group vitamins: Secondary | ICD-10-CM

## 2017-08-14 MED ORDER — CYANOCOBALAMIN 1000 MCG/ML IJ SOLN
1000.0000 ug | Freq: Once | INTRAMUSCULAR | Status: AC
  Administered 2017-08-14: 1000 ug via INTRAMUSCULAR

## 2017-08-14 NOTE — Progress Notes (Addendum)
Patient presented for B 12 injection to left deltoid, patient voiced no concerns nor showed any signs of distress during injection.  Reviewed.  Dr [name] 

## 2017-08-22 ENCOUNTER — Ambulatory Visit (INDEPENDENT_AMBULATORY_CARE_PROVIDER_SITE_OTHER): Payer: Medicare Other | Admitting: Family Medicine

## 2017-08-22 ENCOUNTER — Encounter: Payer: Self-pay | Admitting: Family Medicine

## 2017-08-22 VITALS — BP 130/90 | HR 66 | Temp 98.3°F | Wt 179.5 lb

## 2017-08-22 DIAGNOSIS — Z23 Encounter for immunization: Secondary | ICD-10-CM | POA: Diagnosis not present

## 2017-08-22 DIAGNOSIS — N390 Urinary tract infection, site not specified: Secondary | ICD-10-CM | POA: Insufficient documentation

## 2017-08-22 DIAGNOSIS — N3001 Acute cystitis with hematuria: Secondary | ICD-10-CM | POA: Diagnosis not present

## 2017-08-22 LAB — POC URINALSYSI DIPSTICK (AUTOMATED)
Bilirubin, UA: NEGATIVE
Glucose, UA: NEGATIVE
Ketones, UA: NEGATIVE
Nitrite, UA: NEGATIVE
PROTEIN UA: 30
SPEC GRAV UA: 1.02 (ref 1.010–1.025)
UROBILINOGEN UA: 0.2 U/dL
pH, UA: 6.5 (ref 5.0–8.0)

## 2017-08-22 MED ORDER — CEPHALEXIN 500 MG PO CAPS: 500.0000 mg | ORAL_CAPSULE | Freq: Two times a day (BID) | ORAL | 0 refills | Status: DC

## 2017-08-22 NOTE — Patient Instructions (Signed)
Take the antibiotic as prescribed. ° °Take care ° °Dr. Tacia Hindley  °

## 2017-08-22 NOTE — Progress Notes (Signed)
Subjective:  Patient ID: Lisa Robles, female    DOB: 05-Sep-1946  Age: 71 y.o. MRN: [id number]  CC: Pain with urination  HPI:  71 year old female presents with dysuria.  Patient reports that yesterday she developed dysuria. Possible hematuria although she is not sure. No frequency but does report urgency. No fever. No flank pain. No known exacerbating or relieving factors. Moderate in severity. No other associated symptoms. Patient is concerned that she has UTI.  Additionally, patient has a "bump" around her rectum. She states that she's had this for quite some time. She wants me to examine it.   Social Hx   Social History   Social History  . Marital status: Widowed    Spouse name: N/A  . Number of children: 2  . Years of education: masters   Occupational History  . retired Runner, broadcasting/film/video    Social History Main Topics  . Smoking status: Former Smoker    Quit date: 12/04/1977  . Smokeless tobacco: Never Used  . Alcohol use No  . Drug use: No  . Sexual activity: Yes     Comment: Is sexually active wih same partner x 14 years. Recently found out partner has been cheating.   Other Topics Concern  . None   Social History Narrative  . None    Review of Systems  Constitutional: Negative for fever.  Gastrointestinal: Negative.   Genitourinary: Positive for dysuria and urgency. Negative for flank pain and frequency.   Objective:  BP 130/90 (BP Location: Left Arm, Patient Position: Sitting, Cuff Size: Normal)   Pulse 66   Temp 98.3 F (36.8 C) (Oral)   Wt 179 lb 8 oz (81.4 kg)   SpO2 97%   BMI 31.80 kg/m   BP/Weight 08/22/2017 07/06/2017 05/08/2017  Systolic BP 130 126 -  Diastolic BP 90 76 -  Wt. (Lbs) 179.5 179.2 178.75  BMI 31.8 31.74 31.66   Physical Exam  Constitutional: She is oriented to person, place, and time. She appears well-developed. No distress.  Cardiovascular: Normal rate and regular rhythm.   Murmur heard. Pulmonary/Chest: Effort normal. She has no  wheezes. She has no rales.  Abdominal: Soft. She exhibits no distension. There is no tenderness. There is no rebound and no guarding.  Genitourinary:  Genitourinary Comments: Small firm area noted around the rectum. No fluctuance or erythema (patient requested me to examine this area).  Neurological: She is alert and oriented to person, place, and time.  Vitals reviewed.   Lab Results  Component Value Date   WBC 7.4 07/14/2016   HGB 12.9 07/14/2016   HCT 38.4 07/14/2016   PLT 295.0 07/14/2016   GLUCOSE 113 (H) 07/02/2017   CHOL 180 07/02/2017   TRIG 91.0 07/02/2017   HDL 50.60 07/02/2017   LDLDIRECT 142.7 12/11/2013   LDLCALC 111 (H) 07/02/2017   ALT 11 07/02/2017   AST 16 07/02/2017   NA 138 07/02/2017   K 3.5 07/02/2017   CL 102 07/02/2017   CREATININE 0.75 07/02/2017   BUN 15 07/02/2017   CO2 29 07/02/2017   TSH 1.35 07/14/2016   HGBA1C 6.1 07/02/2017   MICROALBUR 0.2 12/11/2013    Results for orders placed or performed in visit on 08/22/17 (from the past 24 hour(s))  POCT Urinalysis Dipstick (Automated)     Status: Abnormal   Collection Time: 08/22/17 10:03 AM  Result Value Ref Range   Color, UA yellow    Clarity, UA cloudy    Glucose, UA negative  Bilirubin, UA negative    Ketones, UA negative    Spec Grav, UA 1.020 1.010 - 1.025   Blood, UA moderate    pH, UA 6.5 5.0 - 8.0   Protein, UA 30    Urobilinogen, UA 0.2 0.2 or 1.0 E.U./dL   Nitrite, UA negative    Leukocytes, UA Large (3+) (A) Negative   Assessment & Plan:   Problem List Items Addressed This Visit    Acute cystitis with hematuria - Primary    History of UTI. Now with acute UTI. Large leukocytes and moderate blood on UA. Treating empirically with Keflex. Sending culture.      Relevant Orders   POCT Urinalysis Dipstick (Automated) (Completed)   Urine Culture    Other Visit Diagnoses    Encounter for immunization       Relevant Orders   Flu vaccine HIGH DOSE PF (Completed)      Meds  ordered this encounter  Medications  . DISCONTD: cephALEXin (KEFLEX) 500 MG capsule    Sig: Take 1 capsule (500 mg total) by mouth 2 (two) times daily.    Dispense:  14 capsule    Refill:  0  . cephALEXin (KEFLEX) 500 MG capsule    Sig: Take 1 capsule (500 mg total) by mouth 2 (two) times daily.    Dispense:  14 capsule    Refill:  0    Follow-up: PRN  Everlene Other DO [facility]

## 2017-08-22 NOTE — Assessment & Plan Note (Signed)
History of UTI. Now with acute UTI. Large leukocytes and moderate blood on UA. Treating empirically with Keflex. Sending culture.

## 2017-08-24 LAB — URINE CULTURE
MICRO NUMBER:: 81035744
SPECIMEN QUALITY:: ADEQUATE

## 2017-09-10 ENCOUNTER — Telehealth: Payer: Self-pay | Admitting: *Deleted

## 2017-09-10 ENCOUNTER — Encounter: Payer: Self-pay | Admitting: Family Medicine

## 2017-09-10 ENCOUNTER — Ambulatory Visit (INDEPENDENT_AMBULATORY_CARE_PROVIDER_SITE_OTHER): Payer: Medicare Other | Admitting: Family Medicine

## 2017-09-10 VITALS — BP 124/76 | HR 68 | Temp 98.1°F | Wt 179.2 lb

## 2017-09-10 DIAGNOSIS — N3001 Acute cystitis with hematuria: Secondary | ICD-10-CM

## 2017-09-10 LAB — POC URINALSYSI DIPSTICK (AUTOMATED)
Bilirubin, UA: NEGATIVE
Blood, UA: NEGATIVE
Glucose, UA: NEGATIVE
Ketones, UA: NEGATIVE
LEUKOCYTES UA: NEGATIVE
Nitrite, UA: NEGATIVE
PH UA: 6 (ref 5.0–8.0)
PROTEIN UA: NEGATIVE
SPEC GRAV UA: 1.025 (ref 1.010–1.025)
UROBILINOGEN UA: 0.2 U/dL

## 2017-09-10 MED ORDER — FLUCONAZOLE 150 MG PO TABS: 150.0000 mg | ORAL_TABLET | Freq: Once | ORAL | 0 refills | Status: AC

## 2017-09-10 NOTE — Patient Instructions (Signed)
I think the UTI has largely resolved. Urinalysis was ok today. Possible contribution from yeast infection - treat with diflucan  tablet sent to pharmacy. In interim, push fluids and plenty of rest. May take tylenol or ibuprofen as needed for discomfort. Continue to stay well hydrated with plenty of water.  Avoid bladder irritants like spicy foods, caffeine, or carbonated beverages.  Let us know if not improving with treatment.

## 2017-09-10 NOTE — Assessment & Plan Note (Signed)
This has largely resolved. Some intermittent urinary frequency and vaginal burning. Possibly related to vaginal candidiasis not completed treated after topical monistat. Will send diflucan  x1 to complete treatment. Reviewed supportive care as per instructions. Return with PCP if not improved with treatment.

## 2017-09-10 NOTE — Telephone Encounter (Signed)
Pt was treated for a UTI, she has completed the medications estimating around 10 days. Pt continues to have the symptoms of frequent urination , burning with urination and heaviness in lower abdominal. Patient stated taking azo following completion of prescribed medication . Please advise  Pt contact [phone]

## 2017-09-10 NOTE — Addendum Note (Signed)
Addended by: Nanci Pina on: 09/10/2017 11:12 AM   Modules accepted: Orders

## 2017-09-10 NOTE — Progress Notes (Signed)
BP 124/76 (BP Location: Left Arm, Patient Position: Sitting, Cuff Size: Normal)   Pulse 68   Temp 98.1 F (36.7 C) (Oral)   Wt 179 lb 4 oz (81.3 kg)   SpO2 98%   BMI 31.75 kg/m    CC: UTI Subjective:    Patient ID: Lisa Robles, female    DOB: [birth year], 71 y.o.   MRN: [id number]  HPI: Lisa Robles is a 71 y.o. female presenting on 09/10/2017 for Urinary Frequency (Had uti 08/22/17. Given abx. Then took AZO. Sxs did not totally clear up); Abdominal Pain; and Back Pain   Pt of Dr Roby Lofts new to me.  Seen 08/22/2017 at [facility] with dysuria, urgency. Treated for UTI with 7d keflex course. UCx returned showing pan-sensitive Ecoli. Ongoing symptoms despite completing treatment - frequency and lower back pain along with abdominal swelling. She also had yeast infection - treating with OTC monistat without full resolution - ongoing burning at vaginal area. She has taken some azo for this without significant improvement.   No fevers/chills, nausea/vomiting, diarrhea or blood in stool. No new foods. Actually, no further dysuria, urgency, no incomplete emptying or hematuria. Denies vaginal discharge or vulvar/perineal rash.   Not taking estrace vaginal cream.  She doesn't tend to have frequent UTIs.  Recent trip to [facility] where she ate more than normal.   Relevant past medical, surgical, family and social history reviewed and updated as indicated. Interim medical history since our last visit reviewed. Allergies and medications reviewed and updated. Outpatient Medications Prior to Visit  Medication Sig Dispense Refill  . ADVAIR DISKUS 100-50 MCG/DOSE AEPB inhale 1 dose by mouth twice a day 60 each 2  . albuterol (PROVENTIL HFA;VENTOLIN HFA) 108 (90 BASE) MCG/ACT inhaler Inhale 2 puffs into the lungs every 6 (six) hours as needed. 6.7 g 2  . aspirin 81 MG tablet Take 81 mg by mouth daily.    Marland Kitchen estradiol (ESTRACE) 0.1 MG/GM vaginal cream Place 1 Applicatorful vaginally  once a week.    . fluticasone (FLONASE) 50 MCG/ACT nasal spray Place 2 sprays into both nostrils daily. 16 g 1  . hydrochlorothiazide (HYDRODIURIL) 25 MG tablet Take 1 tablet (25 mg total) by mouth daily. 90 tablet 1  . loratadine (CLARITIN) 10 MG tablet Take 10 mg by mouth daily.    . pramoxine-hydrocortisone (ANALPRAM-HC) 1-1 % rectal cream Place 1 application rectally 2 (two) times daily. 30 g 0  . rosuvastatin (CRESTOR) 10 MG tablet Take 0.5 tablets (5 mg total) by mouth daily. 45 tablet 1  . cephALEXin (KEFLEX) 500 MG capsule Take 1 capsule (500 mg total) by mouth 2 (two) times daily. 14 capsule 0  . guaiFENesin (MUCINEX) 600 MG 12 hr tablet Take by mouth 2 (two) times daily.     No facility-administered medications prior to visit.      Per HPI unless specifically indicated in ROS section below Review of Systems     Objective:    BP 124/76 (BP Location: Left Arm, Patient Position: Sitting, Cuff Size: Normal)   Pulse 68   Temp 98.1 F (36.7 C) (Oral)   Wt 179 lb 4 oz (81.3 kg)   SpO2 98%   BMI 31.75 kg/m   Wt Readings from Last 3 Encounters:  09/10/17 179 lb 4 oz (81.3 kg)  08/22/17 179 lb 8 oz (81.4 kg)  07/06/17 179 lb 3.2 oz (81.3 kg)    Physical Exam  Constitutional: She appears well-developed and well-nourished. No distress.  HENT:  Mouth/Throat: Oropharynx is clear and moist. No oropharyngeal exudate.  Cardiovascular: Normal rate, regular rhythm, normal heart sounds and intact distal pulses.   No murmur heard. Pulmonary/Chest: Effort normal and breath sounds normal. No respiratory distress. She has no wheezes. She has no rales.  Abdominal: Soft. Normal appearance and bowel sounds are normal. She exhibits no distension and no mass. There is no hepatosplenomegaly. There is no tenderness. There is no rigidity, no rebound, no guarding, no CVA tenderness and negative Murphy's sign.  Musculoskeletal: She exhibits no edema.  Skin: Skin is warm and dry. No rash noted. No  erythema.  Psychiatric: She has a normal mood and affect.  Nursing note and vitals reviewed.  Results for orders placed or performed in visit on 08/22/17  Urine Culture  Result Value Ref Range   MICRO NUMBER: [id number]    SPECIMEN QUALITY: ADEQUATE    Sample Source NOT GIVEN    STATUS: FINAL    ISOLATE 1: Escherichia coli (A)       Susceptibility   Escherichia coli - URINE CULTURE, REFLEX    AMOX/CLAVULANIC <=2 Sensitive     AMPICILLIN <=2 Sensitive     AMPICILLIN/SULBACTAM <=2 Sensitive     CEFAZOLIN* <=4 Not Reportable      * For infections other than uncomplicated UTIcaused by E. coli, K. pneumoniae or P. mirabilis:Cefazolin is resistant if MIC > or = 8 mcg/mL.(Distinguishing susceptible versus intermediatefor isolates with MIC < or = 4 mcg/mL requiresadditional testing.)For uncomplicated UTI caused by E. coli,K. pneumoniae or P. mirabilis: Cefazolin issusceptible if MIC <32 mcg/mL and predictssusceptible to the oral agents cefaclor, cefdinir,cefpodoxime, cefprozil, cefuroxime, cephalexinand loracarbef.    CEFEPIME <=1 Sensitive     CEFTRIAXONE <=1 Sensitive     CIPROFLOXACIN <=0.25 Sensitive     LEVOFLOXACIN <=0.12 Sensitive     ERTAPENEM <=0.5 Sensitive     GENTAMICIN <=1 Sensitive     IMIPENEM <=0.25 Sensitive     NITROFURANTOIN <=16 Sensitive     PIP/TAZO <=4 Sensitive     TOBRAMYCIN <=1 Sensitive     TRIMETH/SULFA* <=20 Sensitive      * For infections other than uncomplicated UTIcaused by E. coli, K. pneumoniae or P. mirabilis:Cefazolin is resistant if MIC > or = 8 mcg/mL.(Distinguishing susceptible versus intermediatefor isolates with MIC < or = 4 mcg/mL requiresadditional testing.)For uncomplicated UTI caused by E. coli,K. pneumoniae or P. mirabilis: Cefazolin issusceptible if MIC <32 mcg/mL and predictssusceptible to the oral agents cefaclor, cefdinir,cefpodoxime, cefprozil, cefuroxime, cephalexinand loracarbef.Legend:S = Susceptible  I = IntermediateR = Resistant  NS = Not  susceptible* = Not tested  NR = Not reported**NN = See antimicrobic comments  POCT Urinalysis Dipstick (Automated)  Result Value Ref Range   Color, UA yellow    Clarity, UA cloudy    Glucose, UA negative    Bilirubin, UA negative    Ketones, UA negative    Spec Grav, UA 1.020 1.010 - 1.025   Blood, UA moderate    pH, UA 6.5 5.0 - 8.0   Protein, UA 30    Urobilinogen, UA 0.2 0.2 or 1.0 E.U./dL   Nitrite, UA negative    Leukocytes, UA Large (3+) (A) Negative      Assessment & Plan:   Problem List Items Addressed This Visit    Acute cystitis with hematuria - Primary    This has largely resolved. Some intermittent urinary frequency and vaginal burning. Possibly related to vaginal candidiasis not completed treated after topical monistat. Will send  diflucan  x1 to complete treatment. Reviewed supportive care as per instructions. Return with PCP if not improved with treatment.           Follow up plan: Return if symptoms worsen or fail to improve.  Eustaquio Boyden, MD

## 2017-09-10 NOTE — Telephone Encounter (Signed)
Patient scheduled at [facility] at 10:30 am.

## 2017-10-22 ENCOUNTER — Other Ambulatory Visit: Payer: Self-pay | Admitting: Internal Medicine

## 2017-11-13 ENCOUNTER — Other Ambulatory Visit: Payer: Medicare Other

## 2017-11-16 ENCOUNTER — Ambulatory Visit: Payer: Medicare Other | Admitting: Internal Medicine

## 2017-12-05 ENCOUNTER — Other Ambulatory Visit: Payer: Self-pay

## 2017-12-05 ENCOUNTER — Encounter: Payer: Self-pay | Admitting: Family Medicine

## 2017-12-05 ENCOUNTER — Ambulatory Visit: Payer: Medicare Other | Admitting: Family Medicine

## 2017-12-05 VITALS — BP 124/82 | HR 61 | Temp 98.6°F | Ht 63.0 in | Wt 178.0 lb

## 2017-12-05 DIAGNOSIS — E538 Deficiency of other specified B group vitamins: Secondary | ICD-10-CM

## 2017-12-05 DIAGNOSIS — J0100 Acute maxillary sinusitis, unspecified: Secondary | ICD-10-CM | POA: Diagnosis not present

## 2017-12-05 MED ORDER — CYANOCOBALAMIN 1000 MCG/ML IJ SOLN
1000.0000 ug | Freq: Once | INTRAMUSCULAR | Status: AC
  Administered 2017-12-05: 1000 ug via INTRAMUSCULAR

## 2017-12-05 MED ORDER — FLUCONAZOLE 150 MG PO TABS: 150.0000 mg | ORAL_TABLET | Freq: Once | ORAL | 0 refills | Status: AC

## 2017-12-05 MED ORDER — AMOXICILLIN 500 MG PO CAPS: 1000.0000 mg | ORAL_CAPSULE | Freq: Two times a day (BID) | ORAL | 0 refills | Status: AC

## 2017-12-05 NOTE — Progress Notes (Signed)
Dr. Karleen Hampshire T. Trachelle Low, MD, CAQ Sports Medicine [facility] [address] [place], [address] Phone: [phone] Fax: [phone]  12/05/2017  Patient: Lisa Robles, MRN: [id number], DOB: 08-04-1946, 72 y.o.  Primary Physician:  Dale [place], MD   Chief Complaint  Patient presents with  . Sinus Drainage  . Sore Throat  . B12 Injection   Subjective:   Lisa Robles is a 72 y.o. very pleasant female patient who presents with the following:  Sinuses all the time and has drainage all the time, worse the last few days and pain in her ear and pain oon the R side. Some cough and congestion. Using some nasal spray.   Also has a yeast infection. Has done some topical things.     Past Medical History, Surgical History, Social History, Family History, Problem List, Medications, and Allergies have been reviewed and updated if relevant.  Patient Active Problem List   Diagnosis Date Noted  . Acute cystitis with hematuria 08/22/2017  . Health care maintenance 04/20/2015  . Abnormal Pap smear of cervix 05/19/2014  . Numbness and tingling in left hand 12/21/2013  . GERD (gastroesophageal reflux disease) 11/14/2012  . Hypertension 11/14/2012  . Hypercholesterolemia 11/14/2012  . Hyperglycemia 11/14/2012  . Anemia 11/14/2012  . B12 deficiency 11/14/2012    Past Medical History:  Diagnosis Date  . GERD (gastroesophageal reflux disease)   . Hypercholesteremia   . Hypertension     Past Surgical History:  Procedure Laterality Date  . BREAST BIOPSY    . TOOTH EXTRACTION     tooth implantation  . TUBAL LIGATION  1979    Social History   Socioeconomic History  . Marital status: Widowed    Spouse name: Not on file  . Number of children: 2  . Years of education: masters  . Highest education level: Not on file  Social Needs  . Financial resource strain: Not on file  . Food insecurity - worry: Not on file  . Food insecurity - inability:  Not on file  . Transportation needs - medical: Not on file  . Transportation needs - non-medical: Not on file  Occupational History  . Occupation: retired Runner, broadcasting/film/video  Tobacco Use  . Smoking status: Former Smoker    Last attempt to quit: 12/04/1977    Years since quitting: 40.0  . Smokeless tobacco: Never Used  Substance and Sexual Activity  . Alcohol use: No    Alcohol/week: 0.0 oz  . Drug use: No  . Sexual activity: Yes    Comment: Is sexually active wih same partner x 14 years. Recently found out partner has been cheating.  Other Topics Concern  . Not on file  Social History Narrative  . Not on file    Family History  Problem Relation Age of Onset  . Hypercholesterolemia Brother   . Cancer Maternal Aunt        breast  . Colon cancer Neg Hx   . Breast cancer Neg Hx     Allergies  Allergen Reactions  . Cinnamon Other (See Comments)    Mouth irritant   . Shellfish Allergy Rash    Medication list reviewed and updated in full in [facility].  ROS: GEN: Acute illness details above GI: Tolerating PO intake GU: maintaining adequate hydration and urination Pulm: No SOB Interactive and getting along well at home.  Otherwise, ROS is as per the HPI.  Objective:   BP 124/82   Pulse  61   Temp 98.6 F (37 C) (Oral)   Ht 5\' 3"  (1.6 m)   Wt 178 lb (80.7 kg)   SpO2 97%   BMI 31.53 kg/m    Gen: WDWN, NAD; alert,appropriate and cooperative throughout exam    HEENT: Normocephalic and atraumatic. Throat clear, w/o exudate, no LAD, R TM clear, L TM - good landmarks, No fluid present. rhinnorhea.  Left frontal and maxillary sinuses: Tender Right frontal and maxillary sinuses: Tender    Neck: No ant or post LAD  CV: RRR, No M/G/R  Pulm: Breathing comfortably in no resp distress. no w/c/r  Abd: S,NT,ND,+BS Extr: no c/c/e Psych: full affect, pleasant    Laboratory and Imaging Data:  Assessment and Plan:   Acute non-recurrent maxillary sinusitis  Vitamin B12  deficiency - Plan: cyanocobalamin ((VITAMIN B-12)) injection 1,000 mcg  rx sinusitis and yeast  Follow-up: No Follow-up on file.  Meds ordered this encounter  Medications  . amoxicillin (AMOXIL) 500 MG capsule    Sig: Take 2 capsules (1,000 mg total) by mouth 2 (two) times daily for 10 days.    Dispense:  40 capsule    Refill:  0  . fluconazole (DIFLUCAN) 150 MG tablet    Sig: Take 1 tablet (150 mg total) by mouth once for 1 dose. Repeat in 3-4 days if needed x 2    Dispense:  3 tablet    Refill:  0  . cyanocobalamin ((VITAMIN B-12)) injection 1,000 mcg   Signed,  Meighan Treto T. Akiel Fennell, MD   Allergies as of 12/05/2017      Reactions   Cinnamon Other (See Comments)   Mouth irritant   Shellfish Allergy Rash      Medication List        Accurate as of 12/05/17 11:59 PM. Always use your most recent med list.          ADVAIR DISKUS 100-50 MCG/DOSE Aepb Generic drug:  Fluticasone-Salmeterol inhale 1 dose by mouth twice a day   albuterol 108 (90 Base) MCG/ACT inhaler Commonly known as:  PROVENTIL HFA;VENTOLIN HFA Inhale 2 puffs into the lungs every 6 (six) hours as needed.   amoxicillin 500 MG capsule Commonly known as:  AMOXIL Take 2 capsules (1,000 mg total) by mouth 2 (two) times daily for 10 days.   aspirin 81 MG tablet Take 81 mg by mouth daily.   estradiol 0.1 MG/GM vaginal cream Commonly known as:  ESTRACE Place 1 Applicatorful vaginally once a week.   fluconazole 150 MG tablet Commonly known as:  DIFLUCAN Take 1 tablet (150 mg total) by mouth once for 1 dose. Repeat in 3-4 days if needed x 2   fluticasone 50 MCG/ACT nasal spray Commonly known as:  FLONASE Place 2 sprays into both nostrils daily.   guaiFENesin 600 MG 12 hr tablet Commonly known as:  MUCINEX Take by mouth 2 (two) times daily.   hydrochlorothiazide 25 MG tablet Commonly known as:  HYDRODIURIL take 1 tablet by mouth once daily   loratadine 10 MG tablet Commonly known as:  CLARITIN Take  10 mg by mouth daily.   pramoxine-hydrocortisone 1-1 % rectal cream Commonly known as:  ANALPRAM-HC Place 1 application rectally 2 (two) times daily.   rosuvastatin 10 MG tablet Commonly known as:  CRESTOR Take 0.5 tablets (5 mg total) by mouth daily.

## 2017-12-06 ENCOUNTER — Ambulatory Visit: Payer: Medicare Other

## 2018-01-21 ENCOUNTER — Ambulatory Visit (INDEPENDENT_AMBULATORY_CARE_PROVIDER_SITE_OTHER): Payer: Medicare Other

## 2018-01-21 ENCOUNTER — Other Ambulatory Visit: Payer: Self-pay

## 2018-01-21 VITALS — BP 132/74 | HR 65 | Temp 98.4°F | Resp 14 | Ht 63.0 in | Wt 178.8 lb

## 2018-01-21 DIAGNOSIS — R739 Hyperglycemia, unspecified: Secondary | ICD-10-CM

## 2018-01-21 DIAGNOSIS — Z0000 Encounter for general adult medical examination without abnormal findings: Secondary | ICD-10-CM

## 2018-01-21 DIAGNOSIS — I10 Essential (primary) hypertension: Secondary | ICD-10-CM

## 2018-01-21 DIAGNOSIS — E7800 Pure hypercholesterolemia, unspecified: Secondary | ICD-10-CM | POA: Diagnosis not present

## 2018-01-21 DIAGNOSIS — E538 Deficiency of other specified B group vitamins: Secondary | ICD-10-CM

## 2018-01-21 LAB — CBC WITH DIFFERENTIAL/PLATELET
BASOS ABS: 0 10*3/uL (ref 0.0–0.1)
Basophils Relative: 0.6 % (ref 0.0–3.0)
Eosinophils Absolute: 0.1 10*3/uL (ref 0.0–0.7)
Eosinophils Relative: 0.9 % (ref 0.0–5.0)
HCT: 40.8 % (ref 36.0–46.0)
Hemoglobin: 13.5 g/dL (ref 12.0–15.0)
Lymphocytes Relative: 37 % (ref 12.0–46.0)
Lymphs Abs: 2.7 10*3/uL (ref 0.7–4.0)
MCHC: 33.2 g/dL (ref 30.0–36.0)
MCV: 93.2 fl (ref 78.0–100.0)
MONOS PCT: 9.5 % (ref 3.0–12.0)
Monocytes Absolute: 0.7 10*3/uL (ref 0.1–1.0)
NEUTROS PCT: 52 % (ref 43.0–77.0)
Neutro Abs: 3.8 10*3/uL (ref 1.4–7.7)
PLATELETS: 338 10*3/uL (ref 150.0–400.0)
RBC: 4.38 Mil/uL (ref 3.87–5.11)
RDW: 14 % (ref 11.5–15.5)
WBC: 7.4 10*3/uL (ref 4.0–10.5)

## 2018-01-21 LAB — LIPID PANEL
CHOL/HDL RATIO: 4
Cholesterol: 188 mg/dL (ref 0–200)
HDL: 52.4 mg/dL (ref >39.00)
LDL Cholesterol: 114 mg/dL — ABNORMAL HIGH (ref 0–99)
NONHDL: 135.38
TRIGLYCERIDES: 105 mg/dL (ref 0.0–149.0)
VLDL: 21 mg/dL (ref 0.0–40.0)

## 2018-01-21 LAB — HEPATIC FUNCTION PANEL
ALK PHOS: 66 U/L (ref 39–117)
ALT: 11 U/L (ref 0–35)
AST: 15 U/L (ref 0–37)
Albumin: 4.1 g/dL (ref 3.5–5.2)
BILIRUBIN DIRECT: 0.1 mg/dL (ref 0.0–0.3)
TOTAL PROTEIN: 8.1 g/dL (ref 6.0–8.3)
Total Bilirubin: 0.6 mg/dL (ref 0.2–1.2)

## 2018-01-21 LAB — BASIC METABOLIC PANEL
BUN: 12 mg/dL (ref 6–23)
CALCIUM: 10 mg/dL (ref 8.4–10.5)
CO2: 29 mEq/L (ref 19–32)
Chloride: 101 mEq/L (ref 96–112)
Creatinine, Ser: 0.77 mg/dL (ref 0.40–1.20)
GFR: 94.33 mL/min (ref >60.00)
GLUCOSE: 108 mg/dL — AB (ref 70–99)
Potassium: 3.6 mEq/L (ref 3.5–5.1)
SODIUM: 137 meq/L (ref 135–145)

## 2018-01-21 LAB — HEMOGLOBIN A1C: Hgb A1c MFr Bld: 6.1 % (ref 4.6–6.5)

## 2018-01-21 LAB — TSH: TSH: 1.25 u[IU]/mL (ref 0.35–4.50)

## 2018-01-21 MED ORDER — CYANOCOBALAMIN 1000 MCG/ML IJ SOLN: 1000.0000 ug | Freq: Once | INTRAMUSCULAR | 0 refills | Status: DC

## 2018-01-21 MED ORDER — CYANOCOBALAMIN 1000 MCG/ML IJ SOLN
1000.0000 ug | Freq: Once | INTRAMUSCULAR | Status: AC
  Administered 2018-01-21: 1000 ug via INTRAMUSCULAR

## 2018-01-21 NOTE — Patient Instructions (Addendum)
  [name] , Thank you for taking time to come for your Medicare Wellness Visit. I appreciate your ongoing commitment to your health goals. Please review the following plan we discussed and let me know if I can assist you in the future.   Follow up with Dr. Lorin PicketScott as needed.    Bring a copy of your Health Care Power of Attorney and/or Living Will to be scanned into chart once completed.  Have a great day!  These are the goals we discussed: Goals    . increase physical activity     Use fitbit to track 10,000 steps when walking Stay active and continue walking for exercise [facility]        This is a list of the screening recommended for you and due dates:  Health Maintenance  Topic Date Due  . Mammogram  03/03/2019  . Tetanus Vaccine  05/18/2021  . Colon Cancer Screening  09/03/2021  . Flu Shot  Completed  . DEXA scan (bone density measurement)  Completed  .  Hepatitis C: One time screening is recommended by Center for Disease Control  (CDC) for  adults born from [birth year] through 1967.   Completed  . Pneumonia vaccines  Completed

## 2018-01-21 NOTE — Progress Notes (Signed)
Subjective:   Lisa Robles is a 72 y.o. female who presents for Medicare Annual (Subsequent) preventive examination.  Review of Systems:  No ROS.  Medicare Wellness Visit. Additional risk factors are reflected in the social history.  Cardiac Risk Factors include: advanced age (>68men, >34 women);hypertension     Objective:     Vitals: BP 132/74 (BP Location: Left Arm, Patient Position: Sitting, Cuff Size: Normal)   Pulse 65   Temp 98.4 F (36.9 C) (Oral)   Resp 14   Ht 5\' 3"  (1.6 m)   Wt 178 lb 12.8 oz (81.1 kg)   SpO2 95%   BMI 31.67 kg/m   Body mass index is 31.67 kg/m.  Advanced Directives 01/21/2018 01/18/2017 06/30/2016 01/19/2016  Does Patient Have a Medical Advance Directive? No No No No  Would patient like information on creating a medical advance directive? Yes (MAU/Ambulatory/Procedural Areas - Information given) No - Patient declined - Yes - Educational materials given    Tobacco Social History   Tobacco Use  Smoking Status Former Smoker  . Last attempt to quit: 12/04/1977  . Years since quitting: 40.1  Smokeless Tobacco Never Used     Counseling given: Not Answered   Clinical Intake:  Pre-visit preparation completed: Yes  Pain : No/denies pain     Nutritional Status: BMI > 30  Obese Diabetes: No  How often do you need to have someone help you when you read instructions, pamphlets, or other written materials from your doctor or pharmacy?: 1 - Never  Interpreter Needed?: No     Past Medical History:  Diagnosis Date  . GERD (gastroesophageal reflux disease)   . Hypercholesteremia   . Hypertension    Past Surgical History:  Procedure Laterality Date  . BREAST BIOPSY    . TOOTH EXTRACTION     tooth implantation  . TUBAL LIGATION  1979   Family History  Problem Relation Age of Onset  . Hypercholesterolemia Brother   . Cancer Maternal Aunt        breast  . Colon cancer Neg Hx   . Breast cancer Neg Hx    Social History    Socioeconomic History  . Marital status: Widowed    Spouse name: None  . Number of children: 2  . Years of education: masters  . Highest education level: None  Social Needs  . Financial resource strain: None  . Food insecurity - worry: None  . Food insecurity - inability: None  . Transportation needs - medical: None  . Transportation needs - non-medical: None  Occupational History  . Occupation: retired Runner, broadcasting/film/video  Tobacco Use  . Smoking status: Former Smoker    Last attempt to quit: 12/04/1977    Years since quitting: 40.1  . Smokeless tobacco: Never Used  Substance and Sexual Activity  . Alcohol use: No    Alcohol/week: 0.0 oz  . Drug use: No  . Sexual activity: Yes    Comment: Is sexually active wih same partner x 14 years. Recently found out partner has been cheating.  Other Topics Concern  . None  Social History Narrative  . None    Outpatient Encounter Medications as of 01/21/2018  Medication Sig  . ADVAIR DISKUS 100-50 MCG/DOSE AEPB inhale 1 dose by mouth twice a day  . albuterol (PROVENTIL HFA;VENTOLIN HFA) 108 (90 BASE) MCG/ACT inhaler Inhale 2 puffs into the lungs every 6 (six) hours as needed.  Marland Kitchen aspirin 81 MG tablet Take 81 mg by mouth daily.  Marland Kitchen  estradiol (ESTRACE) 0.1 MG/GM vaginal cream Place 1 Applicatorful vaginally once a week.  . fluticasone (FLONASE) 50 MCG/ACT nasal spray Place 2 sprays into both nostrils daily.  Marland Kitchen guaiFENesin (MUCINEX) 600 MG 12 hr tablet Take by mouth 2 (two) times daily.  . hydrochlorothiazide (HYDRODIURIL) 25 MG tablet take 1 tablet by mouth once daily  . loratadine (CLARITIN) 10 MG tablet Take 10 mg by mouth daily.  . pramoxine-hydrocortisone (ANALPRAM-HC) 1-1 % rectal cream Place 1 application rectally 2 (two) times daily.  . rosuvastatin (CRESTOR) 10 MG tablet Take 0.5 tablets (5 mg total) by mouth daily.  . [DISCONTINUED] cyanocobalamin (,VITAMIN B-12,) 1000 MCG/ML injection Inject 1 mL (1,000 mcg total) into the muscle once for 1  dose.  . [EXPIRED] cyanocobalamin ((VITAMIN B-12)) injection 1,000 mcg    No facility-administered encounter medications on file as of 01/21/2018.     Activities of Daily Living In your present state of health, do you have any difficulty performing the following activities: 01/21/2018  Hearing? N  Vision? N  Difficulty concentrating or making decisions? N  Walking or climbing stairs? N  Dressing or bathing? N  Doing errands, shopping? N  Preparing Food and eating ? N  Using the Toilet? N  In the past six months, have you accidently leaked urine? N  Do you have problems with loss of bowel control? N  Managing your Medications? N  Managing your Finances? N  Housekeeping or managing your Housekeeping? N  Some recent data might be hidden    Patient Care Team: Dale Derby Line, MD as PCP - General (Internal Medicine)    Assessment:   This is a routine wellness examination for Lisa Robles.  The goal of the wellness visit is to assist the patient how to close the gaps in care and create a preventative care plan for the patient.   The roster of all physicians providing medical care to patient is listed in the Snapshot section of the chart.  Taking calcium VIT D as appropriate/Osteoporosis risk reviewed.    Safety issues reviewed; Smoke and carbon monoxide detectors in the home. No firearms or firearms locked in a safe within the home. Wears seatbelts when driving or riding with others. No violence in the home.  They do not have excessive sun exposure.  Discussed the need for sun protection: hats, long sleeves and the use of sunscreen if there is significant sun exposure.  Patient is alert, normal appearance, oriented to person/place/and time.  Correctly identified the president of the [place] and recalls of 3/3 words. Performs simple calculations and can read correct time from watch face.  Displays appropriate judgement.  No new identified risk were noted.  No failures at ADL's or  IADL's.    BMI- discussed the importance of a healthy diet, water intake and the benefits of aerobic exercise. Educational material provided.   24 hour diet recall: Regular diet. I encouraged low cholesterol.    Dental- every 6 months.  Eye- Visual acuity not assessed per patient preference since they have regular follow up with the ophthalmologist.  Wears corrective lenses.  Sleep patterns- Sleeps 6-8 hours.   B12 administered per order, L deltoid, tolerated well. Educational material provided.  Fasting labs completed.  Health maintenance gaps- closed.  Patient Concerns: None at this time. Follow up with PCP as needed.  Exercise Activities and Dietary recommendations Current Exercise Habits: Home exercise routine, Type of exercise: walking, Time (Minutes): 15, Frequency (Times/Week): 2, Weekly Exercise (Minutes/Week): 30  Goals    .  increase physical activity     Use fitbit to track 10,000 steps when walking Stay active and continue walking for exercise [facility] aerobics [facility]se       Fall Risk Fall Risk  01/21/2018 01/18/2017 06/30/2016 01/19/2016 04/19/2015  Falls in the past year? No No No No No    Depression Screen PHQ 2/9 Scores 01/21/2018 07/06/2017 01/18/2017 06/30/2016  PHQ - 2 Score 0 0 0 1  PHQ- 9 Score - 0 - -     Cognitive Function MMSE - Mini Mental State Exam 01/18/2017 01/19/2016  Orientation to time 5 5  Orientation to Place 5 5  Registration 3 3  Attention/ Calculation 5 5  Recall 3 3  Language- name 2 objects 2 2  Language- repeat 1 1  Language- follow 3 step command 3 3  Language- read & follow direction 1 1  Write a sentence 1 1  Copy design 1 1  Total score 30 30     6CIT Screen 01/21/2018  What Year? 0 points  What month? 0 points  What time? 0 points  Count back from 20 0 points  Months in reverse 0 points  Repeat phrase 0 points  Total Score 0    Immunization History  Administered Date(s) Administered  .  Influenza, High Dose Seasonal PF 08/17/2016, 08/22/2017  . Influenza,inj,Quad PF,6+ Mos 07/29/2014  . Influenza-Unspecified 09/01/2013, 09/09/2015  . Pneumococcal Conjugate-13 12/01/2015  . Pneumococcal Polysaccharide-23 07/18/2011, 06/28/2017  . Tdap 05/19/2011  . Zoster Recombinat (Shingrix) 06/28/2017    Screening Tests Health Maintenance  Topic Date Due  . MAMMOGRAM  03/03/2019  . TETANUS/TDAP  05/18/2021  . COLONOSCOPY  09/03/2021  . INFLUENZA VACCINE  Completed  . DEXA SCAN  Completed  . Hepatitis C Screening  Completed  . PNA vac Low Risk Adult  Completed        Plan:   End of life planning; Advanced aging; Advanced directives discussed.  No HCPOA/Living Will.  Additional information provided to help them start the conversation with family.  Copy of HCPOA/Living Will requested upon completion. Time spent on this topic is 20 minutes.  I have personally reviewed and noted the following in the patient's chart:   . Medical and social history . Use of alcohol, tobacco or illicit drugs  . Current medications and supplements . Functional ability and status . Nutritional status . Physical activity . Advanced directives . List of other physicians . Hospitalizations, surgeries, and ER visits in previous 12 months . Vitals . Screenings to include cognitive, depression, and falls . Referrals and appointments  In addition, I have reviewed and discussed with patient certain preventive protocols, quality metrics, and best practice recommendations. A written personalized care plan for preventive services as well as general preventive health recommendations were provided to patient.     Ashok PallOBrien-Blaney, Miklo Aken L, LPN  1/61/09602/18/2019   Reviewed above information.  Agree with assessment and plan.    Dr Lorin PicketScott

## 2018-01-24 ENCOUNTER — Telehealth: Payer: Self-pay | Admitting: *Deleted

## 2018-01-24 MED ORDER — ROSUVASTATIN CALCIUM 10 MG PO TABS: 5.0000 mg | ORAL_TABLET | Freq: Every day | ORAL | 1 refills | Status: DC

## 2018-01-24 MED ORDER — ROSUVASTATIN CALCIUM 10 MG PO TABS: 10.0000 mg | ORAL_TABLET | Freq: Every day | ORAL | 0 refills | Status: DC

## 2018-01-24 NOTE — Telephone Encounter (Signed)
-----   Message from Carlean PurlMaryann Barbour, RN sent at 01/24/2018  8:21 AM EST ----- Result note read to patient; verbalizes understanding. Agreeable to dosage increase to 10mg  Crestor.  Will need RX to be sent to new pharmacy as previous one has closed. Send to:  [facility]  [address] [place]   [phone]

## 2018-01-24 NOTE — Addendum Note (Signed)
Addended by: Larry SierrasAVIS, Fontaine Hehl L on: 01/24/2018 04:59 PM   Modules accepted: Orders

## 2018-01-24 NOTE — Telephone Encounter (Signed)
Medication sent to [facility].

## 2018-01-28 ENCOUNTER — Telehealth: Payer: Self-pay

## 2018-01-28 NOTE — Telephone Encounter (Signed)
Copied from CRM [phone]. Topic: Appointment Scheduling - Scheduling Inquiry for Clinic >> Jan 28, 2018  8:39 AM Windy KalataMichael, Taylor L, NT wrote: Patient states on Friday she started noticing some changes after her Crestor dosage went up, she states her left arm starting hurting really bad and some parts of the side of her neck, the pain is better now than it was. She states she has had Bell's Pasly before but her mouth is not drooping, it just feels different. Patient would like to be seen this week by Dr. Lorin PicketScott. I do not see anything available. Please contact patient to schedule. Patient is available all week and anytime.   >> Jan 28, 2018  8:47 AM Windy KalataMichael, Taylor L, NT wrote: Patient also stated her gums bleed when she brushes them, and thy usually do not. Patient states she has been seen that dentist for that already.

## 2018-01-29 NOTE — Telephone Encounter (Signed)
Patient stated that she would go and get evaluated to be sure nothing acute.

## 2018-01-29 NOTE — Telephone Encounter (Signed)
Patient took first pill Thursday night. Patient stated that Friday morning her left arm was hurting extremely mad and she also had a feeling in her mouth like when she had Bell's Palsy. No facial drooping. She just says that she has this symptom sometimes. Started feeling better on Sunday. Patient is still taking increased dose of crestor with no issues but wanted to be seen to rule out any issues with her heart. Advised patient that you did not have any appts this week and she stated she would go to walk-in and let them evaluate her and f/u with you at her appt on 3/13

## 2018-01-29 NOTE — Telephone Encounter (Signed)
Out of office yesterday.  Just received message.  Reviewed her symptoms.  Agree with need for evaluation now if having weird feeling in her mouth and left arm pain, etc.  Needs evaluation now to confirm nothing more acute.  We will f/u after.

## 2018-02-19 ENCOUNTER — Ambulatory Visit
Admission: RE | Admit: 2018-02-19 | Discharge: 2018-02-19 | Disposition: A | Discharge status: Home / Self Care | Payer: Medicare Other | Source: Ambulatory Visit | Attending: Internal Medicine | Admitting: Internal Medicine

## 2018-02-19 ENCOUNTER — Ambulatory Visit (INDEPENDENT_AMBULATORY_CARE_PROVIDER_SITE_OTHER): Payer: Medicare Other

## 2018-02-19 ENCOUNTER — Encounter: Payer: Self-pay | Admitting: Internal Medicine

## 2018-02-19 ENCOUNTER — Ambulatory Visit (INDEPENDENT_AMBULATORY_CARE_PROVIDER_SITE_OTHER): Payer: Medicare Other | Admitting: Internal Medicine

## 2018-02-19 VITALS — BP 124/86 | HR 63 | Temp 98.3°F | Resp 15 | Wt 178.2 lb

## 2018-02-19 DIAGNOSIS — M79602 Pain in left arm: Secondary | ICD-10-CM | POA: Diagnosis not present

## 2018-02-19 DIAGNOSIS — M79601 Pain in right arm: Secondary | ICD-10-CM | POA: Diagnosis not present

## 2018-02-19 DIAGNOSIS — E7800 Pure hypercholesterolemia, unspecified: Secondary | ICD-10-CM

## 2018-02-19 DIAGNOSIS — M47812 Spondylosis without myelopathy or radiculopathy, cervical region: Secondary | ICD-10-CM | POA: Insufficient documentation

## 2018-02-19 DIAGNOSIS — S12401A Unspecified nondisplaced fracture of fifth cervical vertebra, initial encounter for closed fracture: Secondary | ICD-10-CM | POA: Diagnosis not present

## 2018-02-19 DIAGNOSIS — R739 Hyperglycemia, unspecified: Secondary | ICD-10-CM

## 2018-02-19 DIAGNOSIS — E538 Deficiency of other specified B group vitamins: Secondary | ICD-10-CM

## 2018-02-19 DIAGNOSIS — M542 Cervicalgia: Secondary | ICD-10-CM | POA: Diagnosis present

## 2018-02-19 DIAGNOSIS — R9389 Abnormal findings on diagnostic imaging of other specified body structures: Secondary | ICD-10-CM

## 2018-02-19 DIAGNOSIS — I10 Essential (primary) hypertension: Secondary | ICD-10-CM | POA: Diagnosis not present

## 2018-02-19 LAB — SEDIMENTATION RATE: Sed Rate: 37 mm/hr — ABNORMAL HIGH (ref 0–30)

## 2018-02-19 NOTE — Progress Notes (Signed)
Patient ID: Lisa Robles, female   DOB: Jan 26, 1946, 72 y.o.   MRN: [id number]   Subjective:    Patient ID: Lisa Robles, female    DOB: 26-Apr-1946, 72 y.o.   MRN: [id number]  HPI  Patient here for a scheduled follow up.  She reports that on 01/22/18 was hit on the top of her head with a basketball.  Hit directly on the top of her head.  Has had some residual neck pain and stiffness.  No headache.  No dizziness or light headedness.  Also, two days after the injury, she has had some left arm pain.  Localized to her mid arm.  Has had gradual improvement.   Was seen at acute care 01/29/18.  EKG obtained.  Was given meclizine.  She states she noticed minimal sensation change around her lip.  No facial drooping.  States since she has had Bell's Palsy, she has had this occur intermittently.  Nothing new.  Some increased sinus congestion and drainage.  Stays active.  No chest pain.  No sob.  No acid reflux.  No abdominal pain.  Bowels moving.     Past Medical History:  Diagnosis Date  . GERD (gastroesophageal reflux disease)   . Hypercholesteremia   . Hypertension    Past Surgical History:  Procedure Laterality Date  . BREAST BIOPSY    . TOOTH EXTRACTION     tooth implantation  . TUBAL LIGATION  1979   Family History  Problem Relation Age of Onset  . Hypercholesterolemia Brother   . Cancer Maternal Aunt        breast  . Colon cancer Neg Hx   . Breast cancer Neg Hx    Social History   Socioeconomic History  . Marital status: Widowed    Spouse name: Not on file  . Number of children: 2  . Years of education: masters  . Highest education level: Not on file  Occupational History  . Occupation: retired Tour manager  . Financial resource strain: Not on file  . Food insecurity:    Worry: Not on file    Inability: Not on file  . Transportation needs:    Medical: Not on file    Non-medical: Not on file  Tobacco Use  . Smoking status: Former Smoker    Last attempt to  quit: 12/04/1977    Years since quitting: 40.2  . Smokeless tobacco: Never Used  Substance and Sexual Activity  . Alcohol use: No    Alcohol/week: 0.0 oz  . Drug use: No  . Sexual activity: Yes    Comment: Is sexually active wih same partner x 14 years. Recently found out partner has been cheating.  Lifestyle  . Physical activity:    Days per week: 0 days    Minutes per session: Not on file  . Stress: Not on file  Relationships  . Social connections:    Talks on phone: Not on file    Gets together: Not on file    Attends religious service: Not on file    Active member of club or organization: Not on file    Attends meetings of clubs or organizations: Not on file    Relationship status: Not on file  Other Topics Concern  . Not on file  Social History Narrative  . Not on file    Outpatient Encounter Medications as of 02/19/2018  Medication Sig  . ADVAIR DISKUS 100-50 MCG/DOSE AEPB inhale 1 dose by mouth twice  a day  . albuterol (PROVENTIL HFA;VENTOLIN HFA) 108 (90 BASE) MCG/ACT inhaler Inhale 2 puffs into the lungs every 6 (six) hours as needed.  Marland Kitchen aspirin 81 MG tablet Take 81 mg by mouth daily.  Marland Kitchen estradiol (ESTRACE) 0.1 MG/GM vaginal cream Place 1 Applicatorful vaginally once a week.  . fluticasone (FLONASE) 50 MCG/ACT nasal spray Place 2 sprays into both nostrils daily.  Marland Kitchen guaiFENesin (MUCINEX) 600 MG 12 hr tablet Take by mouth 2 (two) times daily.  . hydrochlorothiazide (HYDRODIURIL) 25 MG tablet take 1 tablet by mouth once daily  . loratadine (CLARITIN) 10 MG tablet Take 10 mg by mouth daily.  . pramoxine-hydrocortisone (ANALPRAM-HC) 1-1 % rectal cream Place 1 application rectally 2 (two) times daily.  . rosuvastatin (CRESTOR) 10 MG tablet Take 1 tablet (10 mg total) by mouth daily.   No facility-administered encounter medications on file as of 02/19/2018.     Review of Systems  Constitutional: Negative for appetite change and unexpected weight change.  HENT: Positive for  congestion and postnasal drip. Negative for sinus pressure.   Respiratory: Negative for cough, chest tightness and shortness of breath.   Cardiovascular: Negative for chest pain, palpitations and leg swelling.  Gastrointestinal: Negative for abdominal pain, diarrhea, nausea and vomiting.  Genitourinary: Negative for difficulty urinating and dysuria.  Musculoskeletal: Positive for neck pain. Negative for joint swelling and myalgias.       Left arm pain as outlined.    Skin: Negative for color change and rash.  Neurological: Negative for dizziness, light-headedness and headaches.  Psychiatric/Behavioral: Negative for agitation and dysphoric mood.       Objective:     Blood pressure rechecked by me:  132/76  Physical Exam  Constitutional: She appears well-developed and well-nourished. No distress.  HENT:  Nose: Nose normal.  Mouth/Throat: Oropharynx is clear and moist.  Neck: Neck supple. No thyromegaly present.  Cardiovascular: Normal rate and regular rhythm.  Pulmonary/Chest: Breath sounds normal. No respiratory distress. She has no wheezes.  Abdominal: Soft. Bowel sounds are normal. There is no tenderness.  Musculoskeletal: She exhibits no edema or tenderness.  No significant pain with full extension of her arms.  Good rom.  Some pain with rotation of her head - minimal pulling.    Lymphadenopathy:    She has no cervical adenopathy.  Skin: No rash noted. No erythema.  Psychiatric: She has a normal mood and affect. Her behavior is normal.    BP 124/86 (BP Location: Left Arm, Patient Position: Sitting, Cuff Size: Large)   Pulse 63   Temp 98.3 F (36.8 C) (Oral)   Resp 15   Wt 178 lb 4 oz (80.9 kg)   SpO2 98%   BMI 31.58 kg/m  Wt Readings from Last 3 Encounters:  02/19/18 178 lb 4 oz (80.9 kg)  01/21/18 178 lb 12.8 oz (81.1 kg)  12/05/17 178 lb (80.7 kg)     Lab Results  Component Value Date   WBC 7.4 01/21/2018   HGB 13.5 01/21/2018   HCT 40.8 01/21/2018   PLT  338.0 01/21/2018   GLUCOSE 108 (H) 01/21/2018   CHOL 188 01/21/2018   TRIG 105.0 01/21/2018   HDL 52.40 01/21/2018   LDLDIRECT 142.7 12/11/2013   LDLCALC 114 (H) 01/21/2018   ALT 11 01/21/2018   AST 15 01/21/2018   NA 137 01/21/2018   K 3.6 01/21/2018   CL 101 01/21/2018   CREATININE 0.77 01/21/2018   BUN 12 01/21/2018   CO2 29 01/21/2018  TSH 1.25 01/21/2018   HGBA1C 6.1 01/21/2018   MICROALBUR 0.2 12/11/2013    Mm Screening Breast Tomo Bilateral  Result Date: 03/02/2017 CLINICAL DATA:  Screening. EXAM: 2D DIGITAL SCREENING BILATERAL MAMMOGRAM WITH CAD AND ADJUNCT TOMO COMPARISON:  Previous exam(s). ACR Breast Density Category b: There are scattered areas of fibroglandular density. FINDINGS: There are no findings suspicious for malignancy. Images were processed with CAD. IMPRESSION: No mammographic evidence of malignancy. A result letter of this screening mammogram will be mailed directly to the patient. RECOMMENDATION: Screening mammogram in one year. (Code:SM-B-01Y) BI-RADS CATEGORY  1: Negative. Electronically Signed   By: Lovey Newcomer M.D.   On: 03/02/2017 16:54       Assessment & Plan:   Problem List Items Addressed This Visit    B12 deficiency    Continue B12 injections.        Hypercholesterolemia    On crestor.  Low cholesterol diet and exercise.  Follow lipid panel and liver function tests.        Hyperglycemia    Low carb diet and exercise.  Follow met b and a1c.        Hypertension    Blood pressure on recheck as outlined.  Continue same medication regimen.  Follow pressures.         Other Visit Diagnoses    Neck pain    -  Primary   Relevant Orders   DG Cervical Spine 2 or 3 views (Completed)   Sedimentation rate (Completed)   Pain in both upper extremities       will check esr.  check neck xray.  hold crestor.  follow.     Relevant Orders   Sedimentation rate (Completed)   Cervicalgia       persistent pain after injury.  check xray.     Relevant  Orders   CT CERVICAL SPINE WO CONTRAST (Completed)   Closed nondisplaced fracture of fifth cervical vertebra, unspecified fracture morphology, initial encounter ([place])       Relevant Orders   CT CERVICAL SPINE WO CONTRAST (Completed)   Abnormal x-ray       Relevant Orders   CT CERVICAL SPINE WO CONTRAST (Completed)       Einar Pheasant, MD

## 2018-02-19 NOTE — Patient Instructions (Signed)
Hold crestor

## 2018-02-23 ENCOUNTER — Encounter: Payer: Self-pay | Admitting: Internal Medicine

## 2018-02-23 NOTE — Assessment & Plan Note (Signed)
On crestor.  Low cholesterol diet and exercise.  Follow lipid panel and liver function tests.   

## 2018-02-23 NOTE — Assessment & Plan Note (Signed)
Low carb diet and exercise.  Follow met b and a1c.   

## 2018-02-23 NOTE — Assessment & Plan Note (Signed)
Continue B12 injections.   

## 2018-02-23 NOTE — Assessment & Plan Note (Signed)
Blood pressure on recheck as outlined.  Continue same medication regimen.  Follow pressures.

## 2018-03-12 ENCOUNTER — Encounter: Payer: Self-pay | Admitting: Emergency Medicine

## 2018-03-12 ENCOUNTER — Ambulatory Visit
Admission: EM | Admit: 2018-03-12 | Discharge: 2018-03-12 | Disposition: A | Discharge status: Home / Self Care | Payer: Medicare Other | Attending: Family Medicine | Admitting: Family Medicine

## 2018-03-12 ENCOUNTER — Other Ambulatory Visit: Payer: Self-pay

## 2018-03-12 DIAGNOSIS — J029 Acute pharyngitis, unspecified: Secondary | ICD-10-CM

## 2018-03-12 DIAGNOSIS — R067 Sneezing: Secondary | ICD-10-CM | POA: Diagnosis not present

## 2018-03-12 DIAGNOSIS — B9789 Other viral agents as the cause of diseases classified elsewhere: Secondary | ICD-10-CM | POA: Diagnosis not present

## 2018-03-12 DIAGNOSIS — R05 Cough: Secondary | ICD-10-CM

## 2018-03-12 DIAGNOSIS — J069 Acute upper respiratory infection, unspecified: Secondary | ICD-10-CM | POA: Diagnosis not present

## 2018-03-12 LAB — RAPID STREP SCREEN (MED CTR MEBANE ONLY): STREPTOCOCCUS, GROUP A SCREEN (DIRECT): NEGATIVE

## 2018-03-12 MED ORDER — FLUTICASONE-SALMETEROL 100-50 MCG/DOSE IN AEPB: INHALATION_SPRAY | RESPIRATORY_TRACT | 2 refills | Status: DC

## 2018-03-12 MED ORDER — ALBUTEROL SULFATE HFA 108 (90 BASE) MCG/ACT IN AERS: 2.0000 | INHALATION_SPRAY | Freq: Four times a day (QID) | RESPIRATORY_TRACT | 2 refills | Status: DC | PRN

## 2018-03-12 MED ORDER — HYDROCOD POLST-CPM POLST ER 10-8 MG/5ML PO SUER: 5.0000 mL | Freq: Every evening | ORAL | 0 refills | Status: DC | PRN

## 2018-03-12 MED ORDER — BENZONATATE 100 MG PO CAPS: 100.0000 mg | ORAL_CAPSULE | Freq: Three times a day (TID) | ORAL | 0 refills | Status: DC | PRN

## 2018-03-12 NOTE — ED Triage Notes (Signed)
Patient in today c/o chest congestion, cough, sore throat x 4 days. Patient denies fever. Patient has tried OTC Mucinex and Zyrtec.

## 2018-03-12 NOTE — ED Provider Notes (Signed)
[facility]    CSN: [id number] Arrival date & time: 03/12/18  0850  History   Chief Complaint Chief Complaint  Patient presents with  . Cough   HPI  72 year old female presents with cough.  Patient reports that she is been sick since Thursday.  She works at a school.  She states that she has been sneezing and coughing.  She is also had sore throat.  No body aches or fever.  She has been using Mucinex and over-the-counter sinus medication without significant improvement.  No known exacerbating factors.  She states that her sore throat is worse in the morning.  No other associated symptoms.  No other complaints.  Past Medical History:  Diagnosis Date  . GERD (gastroesophageal reflux disease)   . Hypercholesteremia   . Hypertension    Patient Active Problem List   Diagnosis Date Noted  . Acute cystitis with hematuria 08/22/2017  . Health care maintenance 04/20/2015  . Abnormal Pap smear of cervix 05/19/2014  . Numbness and tingling in left hand 12/21/2013  . GERD (gastroesophageal reflux disease) 11/14/2012  . Hypertension 11/14/2012  . Hypercholesterolemia 11/14/2012  . Hyperglycemia 11/14/2012  . Anemia 11/14/2012  . B12 deficiency 11/14/2012   Past Surgical History:  Procedure Laterality Date  . BREAST BIOPSY    . TOOTH EXTRACTION     tooth implantation  . TUBAL LIGATION  1979   OB History    Gravida  3   Para  3   Term      Preterm      AB      Living        SAB      TAB      Ectopic      Multiple      Live Births             Home Medications    Prior to Admission medications   Medication Sig Start Date End Date Taking? Authorizing Provider  aspirin 81 MG tablet Take 81 mg by mouth daily.   Yes [provider]  fluticasone (FLONASE) 50 MCG/ACT nasal spray Place 2 sprays into both nostrils daily. 10/16/13  Yes Dale Quamba, MD  guaiFENesin (MUCINEX) 600 MG 12 hr tablet Take by mouth 2 (two) times daily.   Yes  [provider]  hydrochlorothiazide (HYDRODIURIL) 25 MG tablet take 1 tablet by mouth once daily 10/23/17  Yes Dale [place], MD  loratadine (CLARITIN) 10 MG tablet Take 10 mg by mouth daily.   Yes [provider]  albuterol (PROVENTIL HFA;VENTOLIN HFA) 108 (90 Base) MCG/ACT inhaler Inhale 2 puffs into the lungs every 6 (six) hours as needed. 03/12/18   Tommie Sams, DO  benzonatate (TESSALON) 100 MG capsule Take 1 capsule (100 mg total) by mouth 3 (three) times daily as needed. 03/12/18   Tommie Sams, DO  chlorpheniramine-HYDROcodone (TUSSIONEX PENNKINETIC ER) 10-8 MG/5ML SUER Take 5 mLs by mouth at bedtime as needed. 03/12/18   Tommie Sams, DO  Fluticasone-Salmeterol (ADVAIR DISKUS) 100-50 MCG/DOSE AEPB inhale 1 dose by mouth twice a day 03/12/18   Tommie Sams, DO  rosuvastatin (CRESTOR) 10 MG tablet Take 1 tablet (10 mg total) by mouth daily. 01/24/18   Dale [place], MD    Family History Family History  Problem Relation Age of Onset  . Heart attack Mother   . Heart attack Father   . Hypercholesterolemia Brother   . Cancer Maternal Aunt  breast  . Colon cancer Neg Hx   . Breast cancer Neg Hx     Social History Social History   Tobacco Use  . Smoking status: Former Smoker    Last attempt to quit: 12/04/1977    Years since quitting: 40.2  . Smokeless tobacco: Never Used  Substance Use Topics  . Alcohol use: No    Alcohol/week: 0.0 oz  . Drug use: No     Allergies   Cinnamon and Shellfish allergy   Review of Systems Review of Systems  Constitutional: Negative for fever.  HENT: Positive for sneezing and sore throat.   Respiratory: Positive for cough.   Musculoskeletal:       No body aches.   Physical Exam Triage Vital Signs ED Triage Vitals  Enc Vitals Group     BP 03/12/18 0916 118/78     Pulse Rate 03/12/18 0916 92     Resp 03/12/18 0916 16     Temp 03/12/18 0916 98.7 F (37.1 C)     Temp Source 03/12/18 0916 Oral     SpO2 03/12/18  0916 98 %     Weight 03/12/18 0917 178 lb (80.7 kg)     Height 03/12/18 0917 5\' 2"  (1.575 m)     Head Circumference --      Peak Flow --      Pain Score 03/12/18 0917 6     Pain Loc --      Pain Edu? --      Excl. in GC? --    Updated Vital Signs BP 118/78 (BP Location: Right Arm)   Pulse 92   Temp 98.7 F (37.1 C) (Oral)   Resp 16   Ht 5\' 2"  (1.575 m)   Wt 178 lb (80.7 kg)   SpO2 98%   BMI 32.56 kg/m  Physical Exam  Constitutional: She is oriented to person, place, and time. She appears well-developed. No distress.  HENT:  Head: Normocephalic and atraumatic.  Oropharynx with moderate erythema.   Eyes: Conjunctivae are normal. Right eye exhibits no discharge. Left eye exhibits no discharge.  Neck: Neck supple.  Cardiovascular: Normal rate and regular rhythm.  Pulmonary/Chest: Effort normal and breath sounds normal. She has no wheezes. She has no rales.  Lymphadenopathy:    She has no cervical adenopathy.  Neurological: She is alert and oriented to person, place, and time.  Psychiatric: She has a normal mood and affect. Her behavior is normal.  Nursing note and vitals reviewed.  UC Treatments / Results  Labs (all labs ordered are listed, but only abnormal results are displayed) Labs Reviewed  RAPID STREP SCREEN ([facility] ONLY)  CULTURE, GROUP A STREP [facility])    EKG None Radiology No results found.  Procedures Procedures (including critical care time)  Medications Ordered in UC Medications - No data to display   Initial Impression / Assessment and Plan / UC Course  I have reviewed the triage vital signs and the nursing notes.  Pertinent labs & imaging results that were available during my care of the patient were reviewed by me and considered in my medical decision making (see chart for details).     72 year old female presents with viral URI with cough.  Strep negative.  Treating with Tussionex and Tessalon Perles.  Patient requested refill on her Advair.   I also refilled her albuterol.  Final Clinical Impressions(s) / UC Diagnoses   Final diagnoses:  Viral URI with cough    ED Discharge Orders  Ordered    Fluticasone-Salmeterol (ADVAIR DISKUS) 100-50 MCG/DOSE AEPB     03/12/18 1006    albuterol (PROVENTIL HFA;VENTOLIN HFA) 108 (90 Base) MCG/ACT inhaler  Every 6 hours PRN     03/12/18 1006    chlorpheniramine-HYDROcodone (TUSSIONEX PENNKINETIC ER) 10-8 MG/5ML SUER  At bedtime PRN     03/12/18 1026    benzonatate (TESSALON) 100 MG capsule  3 times daily PRN     03/12/18 1026     Controlled Substance Prescriptions [place] Controlled Substance Registry consulted? Not Applicable   Tommie SamsCook, Lekeith Wulf G, DO 03/12/18 1037

## 2018-03-12 NOTE — Discharge Instructions (Signed)
Rest.  Medications as prescribed.  Take care  Dr. Joniece Smotherman  

## 2018-03-15 LAB — CULTURE, GROUP A STREP (THRC)

## 2018-03-26 ENCOUNTER — Ambulatory Visit (INDEPENDENT_AMBULATORY_CARE_PROVIDER_SITE_OTHER): Payer: Medicare Other

## 2018-03-26 DIAGNOSIS — E538 Deficiency of other specified B group vitamins: Secondary | ICD-10-CM

## 2018-03-26 MED ORDER — CYANOCOBALAMIN 1000 MCG/ML IJ SOLN
1000.0000 ug | Freq: Once | INTRAMUSCULAR | Status: AC
  Administered 2018-03-26: 1000 ug via INTRAMUSCULAR

## 2018-03-26 NOTE — Progress Notes (Addendum)
Patient comes in for B 12 injection.  Injected right deltoid.  Patient tolerated injection well.   Reviewed.  Dr [name] 

## 2018-04-08 ENCOUNTER — Ambulatory Visit: Payer: Medicare Other | Admitting: Internal Medicine

## 2018-04-10 ENCOUNTER — Other Ambulatory Visit: Payer: Self-pay | Admitting: Internal Medicine

## 2018-04-10 DIAGNOSIS — Z1231 Encounter for screening mammogram for malignant neoplasm of breast: Secondary | ICD-10-CM

## 2018-04-11 ENCOUNTER — Ambulatory Visit
Admission: RE | Admit: 2018-04-11 | Discharge: 2018-04-11 | Disposition: A | Discharge status: Home / Self Care | Payer: Medicare Other | Source: Ambulatory Visit | Attending: Internal Medicine | Admitting: Internal Medicine

## 2018-04-11 DIAGNOSIS — Z1231 Encounter for screening mammogram for malignant neoplasm of breast: Secondary | ICD-10-CM

## 2018-04-12 ENCOUNTER — Encounter: Payer: Self-pay | Admitting: Urgent Care

## 2018-04-12 ENCOUNTER — Ambulatory Visit: Payer: Medicare Other | Admitting: Urgent Care

## 2018-04-12 ENCOUNTER — Other Ambulatory Visit: Payer: Self-pay

## 2018-04-12 VITALS — BP 132/82 | HR 67 | Temp 98.0°F | Resp 16 | Ht 62.6 in | Wt 178.0 lb

## 2018-04-12 DIAGNOSIS — J0100 Acute maxillary sinusitis, unspecified: Secondary | ICD-10-CM

## 2018-04-12 DIAGNOSIS — R05 Cough: Secondary | ICD-10-CM | POA: Diagnosis not present

## 2018-04-12 DIAGNOSIS — R059 Cough, unspecified: Secondary | ICD-10-CM

## 2018-04-12 DIAGNOSIS — H9203 Otalgia, bilateral: Secondary | ICD-10-CM

## 2018-04-12 DIAGNOSIS — J3089 Other allergic rhinitis: Secondary | ICD-10-CM | POA: Diagnosis not present

## 2018-04-12 DIAGNOSIS — R0981 Nasal congestion: Secondary | ICD-10-CM | POA: Diagnosis not present

## 2018-04-12 MED ORDER — PREDNISONE 20 MG PO TABS: 20.0000 mg | ORAL_TABLET | Freq: Every day | ORAL | 0 refills | Status: DC

## 2018-04-12 MED ORDER — AMOXICILLIN 500 MG PO CAPS: 500.0000 mg | ORAL_CAPSULE | Freq: Three times a day (TID) | ORAL | 0 refills | Status: DC

## 2018-04-12 NOTE — Patient Instructions (Addendum)
Sinusitis, Adult Sinusitis is soreness and inflammation of your sinuses. Sinuses are hollow spaces in the bones around your face. Your sinuses are located:  Around your eyes.  In the middle of your forehead.  Behind your nose.  In your cheekbones.  Your sinuses and nasal passages are lined with a stringy fluid (mucus). Mucus normally drains out of your sinuses. When your nasal tissues become inflamed or swollen, the mucus can become trapped or blocked so air cannot flow through your sinuses. This allows bacteria, viruses, and funguses to grow, which leads to infection. Sinusitis can develop quickly and last for 7?10 days (acute) or for more than 12 weeks (chronic). Sinusitis often develops after a cold. What are the causes? This condition is caused by anything that creates swelling in the sinuses or stops mucus from draining, including:  Allergies.  Asthma.  Bacterial or viral infection.  Abnormally shaped bones between the nasal passages.  Nasal growths that contain mucus (nasal polyps).  Narrow sinus openings.  Pollutants, such as chemicals or irritants in the air.  A foreign object stuck in the nose.  A fungal infection. This is rare.  What increases the risk? The following factors may make you more likely to develop this condition:  Having allergies or asthma.  Having had a recent cold or respiratory tract infection.  Having structural deformities or blockages in your nose or sinuses.  Having a weak immune system.  Doing a lot of swimming or diving.  Overusing nasal sprays.  Smoking.  What are the signs or symptoms? The main symptoms of this condition are pain and a feeling of pressure around the affected sinuses. Other symptoms include:  Upper toothache.  Earache.  Headache.  Bad breath.  Decreased sense of smell and taste.  A cough that may get worse at night.  Fatigue.  Fever.  Thick drainage from your nose. The drainage is often green and  it may contain pus (purulent).  Stuffy nose or congestion.  Postnasal drip. This is when extra mucus collects in the throat or back of the nose.  Swelling and warmth over the affected sinuses.  Sore throat.  Sensitivity to light.  How is this diagnosed? This condition is diagnosed based on symptoms, a medical history, and a physical exam. To find out if your condition is acute or chronic, your health care provider may:  Look in your nose for signs of nasal polyps.  Tap over the affected sinus to check for signs of infection.  View the inside of your sinuses using an imaging device that has a light attached (endoscope).  If your health care provider suspects that you have chronic sinusitis, you may also:  Be tested for allergies.  Have a sample of mucus taken from your nose (nasal culture) and checked for bacteria.  Have a mucus sample examined to see if your sinusitis is related to an allergy.  If your sinusitis does not respond to treatment and it lasts longer than 8 weeks, you may have an MRI or CT scan to check your sinuses. These scans also help to determine how severe your infection is. In rare cases, a bone biopsy may be done to rule out more serious types of fungal sinus disease. How is this treated? Treatment for sinusitis depends on the cause and whether your condition is chronic or acute. If a virus is causing your sinusitis, your symptoms will go away on their own within 10 days. You may be given medicines to relieve your symptoms,   including:  Topical nasal decongestants. They shrink swollen nasal passages and let mucus drain from your sinuses.  Antihistamines. These drugs block inflammation that is triggered by allergies. This can help to ease swelling in your nose and sinuses.  Topical nasal corticosteroids. These are nasal sprays that ease inflammation and swelling in your nose and sinuses.  Nasal saline washes. These rinses can help to get rid of thick mucus in  your nose.  If your condition is caused by bacteria, you will be given an antibiotic medicine. If your condition is caused by a fungus, you will be given an antifungal medicine. Surgery may be needed to correct underlying conditions, such as narrow nasal passages. Surgery may also be needed to remove polyps. Follow these instructions at home: Medicines  Take, use, or apply over-the-counter and prescription medicines only as told by your health care provider. These may include nasal sprays.  If you were prescribed an antibiotic medicine, take it as told by your health care provider. Do not stop taking the antibiotic even if you start to feel better. Hydrate and Humidify  Drink enough water to keep your urine clear or pale yellow. Staying hydrated will help to thin your mucus.  Use a cool mist humidifier to keep the humidity level in your home above 50%.  Inhale steam for 10-15 minutes, 3-4 times a day or as told by your health care provider. You can do this in the bathroom while a hot shower is running.  Limit your exposure to cool or dry air. Rest  Rest as much as possible.  Sleep with your head raised (elevated).  Make sure to get enough sleep each night. General instructions  Apply a warm, moist washcloth to your face 3-4 times a day or as told by your health care provider. This will help with discomfort.  Wash your hands often with soap and water to reduce your exposure to viruses and other germs. If soap and water are not available, use hand sanitizer.  Do not smoke. Avoid being around people who are smoking (secondhand smoke).  Keep all follow-up visits as told by your health care provider. This is important. Contact a health care provider if:  You have a fever.  Your symptoms get worse.  Your symptoms do not improve within 10 days. Get help right away if:  You have a severe headache.  You have persistent vomiting.  You have pain or swelling around your face or  eyes.  You have vision problems.  You develop confusion.  Your neck is stiff.  You have trouble breathing. This information is not intended to replace advice given to you by your health care provider. Make sure you discuss any questions you have with your health care provider. Document Released: 11/20/2005 Document Revised: 07/16/2016 Document Reviewed: 09/15/2015 Elsevier Interactive Patient Education  2018 [employer]     IF you received an x-ray today, you will receive an invoice from [facility]. Please contact [facility] at [phone] with questions or concerns regarding your invoice.   IF you received labwork today, you will receive an invoice from [facility]. Please contact [facility] at [phone] with questions or concerns regarding your invoice.   Our billing staff will not be able to assist you with questions regarding bills from these companies.  You will be contacted with the lab results as soon as they are available. The fastest way to get your results is to activate your My Chart account. Instructions are located on the last page   of this paperwork. If you have not heard from us regarding the results in 2 weeks, please contact this office.      

## 2018-04-12 NOTE — Progress Notes (Signed)
    MRN: [id number] DOB: Feb 28, 1946  Subjective:   Lisa Robles is a 72 y.o. female presenting for 3 week history of sinus pain, sinus congestion, upper tooth pain, bilateral ear pain, mild sore throat, right sided neck discomfort. Has tried Tessalon and Tussionex which she had leftover from a previous prescription.  Denies fever, ear drainage, chest pain, shortness of breath, wheezing, nausea, vomiting, belly pain, rashes.  Denies history of cigarettes. Has asthma, has not needed to use her inhaler.  She is not currently taking her Claritin or Flonase for her allergies.  She admits that she has woken up with mucus in her throat that she has had a clear every morning.  Lisa Robles has a current medication list which includes the following prescription(s): albuterol, aspirin, benzonatate, chlorpheniramine-hydrocodone, fluticasone, fluticasone-salmeterol, guaifenesin, hydrochlorothiazide, loratadine, and rosuvastatin. Also is allergic to cinnamon and shellfish allergy.  Lisa Robles  has a past medical history of GERD (gastroesophageal reflux disease), Hypercholesteremia, and Hypertension. Also  has a past surgical history that includes Tubal ligation (1979); Tooth Extraction; and Breast biopsy. Her family history includes Cancer in her maternal aunt; Heart attack in her father and mother; Hypercholesterolemia in her brother.   Objective:   Vitals: BP 132/82   Pulse 67   Temp 98 F (36.7 C) (Oral)   Resp 16   Ht 5' 2.6" (1.59 m)   Wt 178 lb (80.7 kg)   SpO2 97%   BMI 31.94 kg/m   Physical Exam  Constitutional: She is oriented to person, place, and time. She appears well-developed and well-nourished.  HENT:  Right Ear: Tympanic membrane, external ear and ear canal normal. Tympanic membrane is not injected, not erythematous and not retracted.  Left Ear: Tympanic membrane, external ear and ear canal normal. Tympanic membrane is not injected, not erythematous and not retracted.  Nose: Sinus  tenderness present.  Mouth/Throat: Oropharynx is clear and moist.  Eyes: Pupils are equal, round, and reactive to light. Right eye exhibits no discharge. Left eye exhibits no discharge. No scleral icterus.  Neck: Normal range of motion. Neck supple.  Right-sided cervical lymph node tenderness.  Cardiovascular: Normal rate, regular rhythm, normal heart sounds and intact distal pulses. Exam reveals no gallop and no friction rub.  No murmur heard. Pulmonary/Chest: Effort normal and breath sounds normal. No stridor. No respiratory distress. She has no wheezes. She has no rales.  Lymphadenopathy:    She has no cervical adenopathy.  Neurological: She is alert and oriented to person, place, and time.   Assessment and Plan :   Acute non-recurrent maxillary sinusitis  Allergic rhinitis due to other allergic trigger, unspecified seasonality  Cough  Acute ear pain, bilateral  Nasal congestion  Start amoxicillin with prednisone to address a sinusitis secondary to uncontrolled allergies.  Recommend the patient hydrate very well and continue to use cough suppression medication.  I did recommend she restart her Claritin but hold off on Flonase. Counseled patient on potential for adverse effects with medications prescribed today, patient verbalized understanding. Return-to-clinic precautions discussed, patient verbalized understanding.   Wallis Bamberg, PA-C [facility] [phone] 04/12/2018  4:40 PM

## 2018-04-24 ENCOUNTER — Ambulatory Visit: Payer: Medicare Other | Admitting: Internal Medicine

## 2018-05-03 ENCOUNTER — Ambulatory Visit: Payer: Medicare Other | Admitting: Internal Medicine

## 2018-05-03 VITALS — BP 132/80 | HR 72 | Temp 98.3°F | Resp 18 | Wt 176.6 lb

## 2018-05-03 DIAGNOSIS — E7800 Pure hypercholesterolemia, unspecified: Secondary | ICD-10-CM

## 2018-05-03 DIAGNOSIS — R739 Hyperglycemia, unspecified: Secondary | ICD-10-CM

## 2018-05-03 DIAGNOSIS — D649 Anemia, unspecified: Secondary | ICD-10-CM

## 2018-05-03 DIAGNOSIS — E538 Deficiency of other specified B group vitamins: Secondary | ICD-10-CM

## 2018-05-03 DIAGNOSIS — R109 Unspecified abdominal pain: Secondary | ICD-10-CM

## 2018-05-03 DIAGNOSIS — I10 Essential (primary) hypertension: Secondary | ICD-10-CM

## 2018-05-03 DIAGNOSIS — R0981 Nasal congestion: Secondary | ICD-10-CM | POA: Diagnosis not present

## 2018-05-03 LAB — POCT URINALYSIS DIPSTICK
BILIRUBIN UA: NEGATIVE
Glucose, UA: NEGATIVE
KETONES UA: NEGATIVE
NITRITE UA: NEGATIVE
PH UA: 7 (ref 5.0–8.0)
PROTEIN UA: NEGATIVE
RBC UA: NEGATIVE
Spec Grav, UA: 1.02 (ref 1.010–1.025)
UROBILINOGEN UA: 1 U/dL

## 2018-05-03 LAB — URINALYSIS, MICROSCOPIC ONLY: RBC / HPF: NONE SEEN (ref 0–?)

## 2018-05-03 MED ORDER — CYANOCOBALAMIN 1000 MCG/ML IJ SOLN
1000.0000 ug | Freq: Once | INTRAMUSCULAR | Status: AC
  Administered 2018-05-03: 1000 ug via INTRAMUSCULAR

## 2018-05-03 NOTE — Progress Notes (Signed)
Patient ID: Lisa Robles, female   DOB: 1946/01/19, 72 y.o.   MRN: [id number]   Subjective:    Patient ID: Lisa Robles, female    DOB: [birth year], 72 y.o.   MRN: [id number]  HPI  Patient here for a scheduled follow up.  She reports she has been doing relatively well.  Trying to stay active.  No chest pain.  No sob.  No acid reflux.  No abdominal pain.  Bowels moving.  No diarrhea.  She reports that she was seen 04/23/18 - in [place].  Was having some dysuria and heavy feeling.  Some urinary urgency.  Urine obtained and placed on bactrim.  After two days, developed rash.  Stopped the medication.  Rash resolved.  Feels better.  Still noticed some dysuria yesterday, but no significant symptoms.  No vaginal itching or burning.  No discharge.  Some congestion/minimal cough.     Past Medical History:  Diagnosis Date  . GERD (gastroesophageal reflux disease)   . Hypercholesteremia   . Hypertension    Past Surgical History:  Procedure Laterality Date  . BREAST BIOPSY    . TOOTH EXTRACTION     tooth implantation  . TUBAL LIGATION  1979   Family History  Problem Relation Age of Onset  . Heart attack Mother   . Heart attack Father   . Hypercholesterolemia Brother   . Cancer Maternal Aunt        breast  . Colon cancer Neg Hx   . Breast cancer Neg Hx    Social History   Socioeconomic History  . Marital status: Widowed    Spouse name: Not on file  . Number of children: 2  . Years of education: masters  . Highest education level: Not on file  Occupational History  . Occupation: retired Tour manager  . Financial resource strain: Not on file  . Food insecurity:    Worry: Not on file    Inability: Not on file  . Transportation needs:    Medical: Not on file    Non-medical: Not on file  Tobacco Use  . Smoking status: Former Smoker    Last attempt to quit: 12/04/1977    Years since quitting: 40.4  . Smokeless tobacco: Never Used  Substance and Sexual Activity  .  Alcohol use: No    Alcohol/week: 0.0 oz  . Drug use: No  . Sexual activity: Yes    Comment: Is sexually active wih same partner x 14 years. Recently found out partner has been cheating.  Lifestyle  . Physical activity:    Days per week: 0 days    Minutes per session: Not on file  . Stress: Not on file  Relationships  . Social connections:    Talks on phone: Not on file    Gets together: Not on file    Attends religious service: Not on file    Active member of club or organization: Not on file    Attends meetings of clubs or organizations: Not on file    Relationship status: Not on file  Other Topics Concern  . Not on file  Social History Narrative  . Not on file    Outpatient Encounter Medications as of 05/03/2018  Medication Sig  . albuterol (PROVENTIL HFA;VENTOLIN HFA) 108 (90 Base) MCG/ACT inhaler Inhale 2 puffs into the lungs every 6 (six) hours as needed.  Marland Kitchen aspirin 81 MG tablet Take 81 mg by mouth daily.  . fluticasone (FLONASE) 50 MCG/ACT  nasal spray Place 2 sprays into both nostrils daily.  . Fluticasone-Salmeterol (ADVAIR DISKUS) 100-50 MCG/DOSE AEPB inhale 1 dose by mouth twice a day  . guaiFENesin (MUCINEX) 600 MG 12 hr tablet Take by mouth 2 (two) times daily.  . hydrochlorothiazide (HYDRODIURIL) 25 MG tablet take 1 tablet by mouth once daily  . loratadine (CLARITIN) 10 MG tablet Take 10 mg by mouth daily.  . rosuvastatin (CRESTOR) 10 MG tablet Take 1 tablet (10 mg total) by mouth daily.  . [DISCONTINUED] amoxicillin (AMOXIL) 500 MG capsule Take 1 capsule (500 mg total) by mouth 3 (three) times daily.  . [DISCONTINUED] benzonatate (TESSALON) 100 MG capsule Take 1 capsule (100 mg total) by mouth 3 (three) times daily as needed.  . [DISCONTINUED] chlorpheniramine-HYDROcodone (TUSSIONEX PENNKINETIC ER) 10-8 MG/5ML SUER Take 5 mLs by mouth at bedtime as needed.  . [DISCONTINUED] predniSONE (DELTASONE) 20 MG tablet Take 1 tablet (20 mg total) by mouth daily with breakfast.    . [EXPIRED] cyanocobalamin ((VITAMIN B-12)) injection 1,000 mcg    No facility-administered encounter medications on file as of 05/03/2018.     Review of Systems  Constitutional: Negative for appetite change and unexpected weight change.  HENT: Negative for congestion and sinus pressure.   Respiratory: Negative for cough, chest tightness and shortness of breath.   Cardiovascular: Negative for chest pain, palpitations and leg swelling.  Gastrointestinal: Negative for abdominal pain, diarrhea, nausea and vomiting.  Genitourinary: Positive for dysuria. Negative for vaginal discharge and vaginal pain.  Musculoskeletal: Negative for joint swelling and myalgias.  Skin: Negative for color change and rash.  Neurological: Negative for dizziness, light-headedness and headaches.  Psychiatric/Behavioral: Negative for agitation and dysphoric mood.       Objective:    Physical Exam  Constitutional: She appears well-developed and well-nourished. No distress.  HENT:  Nose: Nose normal.  Mouth/Throat: Oropharynx is clear and moist.  Neck: Neck supple. No thyromegaly present.  Cardiovascular: Normal rate and regular rhythm.  Pulmonary/Chest: Breath sounds normal. No respiratory distress. She has no wheezes.  Abdominal: Soft. Bowel sounds are normal. There is no tenderness.  Musculoskeletal: She exhibits no edema or tenderness.  No CVA tenderness.    Lymphadenopathy:    She has no cervical adenopathy.  Skin: No rash noted. No erythema.  Psychiatric: She has a normal mood and affect. Her behavior is normal.    BP 132/80 (BP Location: Left Arm, Patient Position: Sitting, Cuff Size: Large)   Pulse 72   Temp 98.3 F (36.8 C) (Oral)   Resp 18   Wt 176 lb 9.6 oz (80.1 kg)   SpO2 97%   BMI 31.69 kg/m  Wt Readings from Last 3 Encounters:  05/03/18 176 lb 9.6 oz (80.1 kg)  04/12/18 178 lb (80.7 kg)  03/12/18 178 lb (80.7 kg)     Lab Results  Component Value Date   WBC 7.4 01/21/2018    HGB 13.5 01/21/2018   HCT 40.8 01/21/2018   PLT 338.0 01/21/2018   GLUCOSE 108 (H) 01/21/2018   CHOL 188 01/21/2018   TRIG 105.0 01/21/2018   HDL 52.40 01/21/2018   LDLDIRECT 142.7 12/11/2013   LDLCALC 114 (H) 01/21/2018   ALT 11 01/21/2018   AST 15 01/21/2018   NA 137 01/21/2018   K 3.6 01/21/2018   CL 101 01/21/2018   CREATININE 0.77 01/21/2018   BUN 12 01/21/2018   CO2 29 01/21/2018   TSH 1.25 01/21/2018   HGBA1C 6.1 01/21/2018   MICROALBUR 0.2 12/11/2013  Mm 3d Screen Breast Bilateral  Result Date: 04/11/2018 CLINICAL DATA:  Screening. EXAM: DIGITAL SCREENING BILATERAL MAMMOGRAM WITH TOMO AND CAD COMPARISON:  Previous exam(s). ACR Breast Density Category c: The breast tissue is heterogeneously dense, which may obscure small masses. FINDINGS: There are no findings suspicious for malignancy. Images were processed with CAD. IMPRESSION: No mammographic evidence of malignancy. A result letter of this screening mammogram will be mailed directly to the patient. RECOMMENDATION: Screening mammogram in one year. (Code:SM-B-01Y) BI-RADS CATEGORY  1: Negative. Electronically Signed   By: Nolon Nations M.D.   On: 04/11/2018 09:03       Assessment & Plan:   Problem List Items Addressed This Visit    Anemia    Follow cbc.        Relevant Medications   cyanocobalamin ((VITAMIN B-12)) injection 1,000 mcg (Completed)   B12 deficiency    Continue B12 injections.        Relevant Medications   cyanocobalamin ((VITAMIN B-12)) injection 1,000 mcg (Completed)   Hypercholesterolemia    On crestor.  Low cholesterol diet and exercise.  Follow lipid panel and liver function tests.        Relevant Orders   Hepatic function panel   Lipid panel   Hyperglycemia    Low carb diet and exercise.  Follow met b and a1c.        Relevant Orders   Hemoglobin A1c   Hypertension    Blood pressure under good control.  Continue same medication regimen.  Follow pressures.  Follow metabolic panel.         Relevant Orders   Basic metabolic panel    Other Visit Diagnoses    Abdominal pressure    -  Primary   No significant pressure today.  off abx.  recheck urine and culture to confirm if infection.  Follow.     Relevant Orders   Urine Microscopic Only (Completed)   CULTURE, URINE COMPREHENSIVE (Completed)   POCT Urinalysis Dipstick (Completed)   Nasal congestion       Saline nasal spray and nasacort nasal spray as outlined.  Robitussin DM as directed.  Notify me if symptoms do not resolve.         Einar Pheasant, MD

## 2018-05-03 NOTE — Progress Notes (Signed)
yabn

## 2018-05-03 NOTE — Patient Instructions (Signed)
Robitussin DM - twice a day as need for cough and congestion  Saline nasal spray - flush nose at least 2-3x/day  nasacort nasal spray - 2 sprays each nostril one time per day.  Do this in the evening

## 2018-05-05 ENCOUNTER — Encounter: Payer: Self-pay | Admitting: Internal Medicine

## 2018-05-05 LAB — CULTURE, URINE COMPREHENSIVE
MICRO NUMBER: 90657702
RESULT: NO GROWTH
SPECIMEN QUALITY: ADEQUATE

## 2018-05-05 NOTE — Assessment & Plan Note (Signed)
On crestor.  Low cholesterol diet and exercise.  Follow lipid panel and liver function tests.   

## 2018-05-05 NOTE — Assessment & Plan Note (Signed)
Continue B12 injections.   

## 2018-05-05 NOTE — Assessment & Plan Note (Signed)
Follow cbc.  

## 2018-05-05 NOTE — Assessment & Plan Note (Signed)
Low carb diet and exercise.  Follow met b and a1c.   

## 2018-05-05 NOTE — Assessment & Plan Note (Signed)
Blood pressure under good control.  Continue same medication regimen.  Follow pressures.  Follow metabolic panel.   

## 2018-05-22 ENCOUNTER — Telehealth: Payer: Self-pay | Admitting: Internal Medicine

## 2018-05-22 MED ORDER — MAGIC MOUTHWASH: 5.0000 mL | Freq: Three times a day (TID) | ORAL | 0 refills | Status: DC | PRN

## 2018-05-22 NOTE — Telephone Encounter (Signed)
Copied from CRM [id number]. Topic: Quick Communication - Rx Refill/Question >> May 22, 2018  9:53 AM Crist InfanteHarrald, Kathy J wrote: Medication: Rx for thrush  Pt saw Dr Lorin PicketScott 5/31 and discussed having thrush symptoms.  Was prescribed Rx for yeast, and thought that would cure, but it has not. Now requesting the mouthwash to help.  [facility] [phone] - [place] - [address] AT [facility] [address] [phone] (Phone) [phone] (Fax)

## 2018-05-22 NOTE — Telephone Encounter (Signed)
rx has been faxed and pt is [facility] [place]. Pt gave a verbal understanding.

## 2018-05-22 NOTE — Telephone Encounter (Signed)
We will fax the rx for magic  Mouthwash  To [facility] in [place]

## 2018-05-22 NOTE — Telephone Encounter (Signed)
Patient had thrush when she seen Dr. Lorin PicketScott but thought that it was getting better so did not want rx. She has been treated with magic mouthwash for thrush before and it cleared it up. Pt is requesting rx for the mouthwash.

## 2018-05-22 NOTE — Telephone Encounter (Signed)
Please advise 

## 2018-06-21 ENCOUNTER — Telehealth: Payer: Self-pay | Admitting: Internal Medicine

## 2018-06-21 NOTE — Telephone Encounter (Signed)
Copied from CRM [id number]. Topic: Quick Communication - See Telephone Encounter >> Jun 21, 2018 12:03 PM Waymon AmatoBurton, Donna F wrote: Pt is needing a refill on her hydrochlorothiazide  [facility]  Best number is [phone]

## 2018-06-24 MED ORDER — HYDROCHLOROTHIAZIDE 25 MG PO TABS: 25.0000 mg | ORAL_TABLET | Freq: Every day | ORAL | 1 refills | Status: DC

## 2018-08-28 ENCOUNTER — Ambulatory Visit (INDEPENDENT_AMBULATORY_CARE_PROVIDER_SITE_OTHER): Payer: Medicare Other | Admitting: *Deleted

## 2018-08-28 DIAGNOSIS — E538 Deficiency of other specified B group vitamins: Secondary | ICD-10-CM | POA: Diagnosis not present

## 2018-08-28 DIAGNOSIS — Z23 Encounter for immunization: Secondary | ICD-10-CM

## 2018-08-28 MED ORDER — CYANOCOBALAMIN 1000 MCG/ML IJ SOLN
1000.0000 ug | Freq: Once | INTRAMUSCULAR | Status: AC
  Administered 2018-08-28: 1000 ug via INTRAMUSCULAR

## 2018-08-28 NOTE — Progress Notes (Signed)
Patient presented for B 12 injection to right deltoid, patient voiced no concerns nor showed any signs of distress during injection. 

## 2018-09-12 ENCOUNTER — Other Ambulatory Visit: Payer: Medicare Other

## 2018-09-19 ENCOUNTER — Encounter: Payer: Medicare Other | Admitting: Internal Medicine

## 2018-09-27 ENCOUNTER — Ambulatory Visit: Payer: Medicare Other | Admitting: Family Medicine

## 2018-09-27 ENCOUNTER — Encounter: Payer: Self-pay | Admitting: Family Medicine

## 2018-09-27 VITALS — BP 120/74 | HR 78 | Temp 98.3°F | Ht 63.0 in | Wt 180.8 lb

## 2018-09-27 DIAGNOSIS — N898 Other specified noninflammatory disorders of vagina: Secondary | ICD-10-CM | POA: Diagnosis not present

## 2018-09-27 DIAGNOSIS — B373 Candidiasis of vulva and vagina: Secondary | ICD-10-CM | POA: Diagnosis not present

## 2018-09-27 DIAGNOSIS — B3731 Acute candidiasis of vulva and vagina: Secondary | ICD-10-CM

## 2018-09-27 DIAGNOSIS — R8290 Unspecified abnormal findings in urine: Secondary | ICD-10-CM | POA: Diagnosis not present

## 2018-09-27 LAB — POCT URINALYSIS DIPSTICK
Bilirubin, UA: NEGATIVE
GLUCOSE UA: NEGATIVE
Ketones, UA: NEGATIVE
Leukocytes, UA: NEGATIVE
Nitrite, UA: NEGATIVE
PROTEIN UA: NEGATIVE
RBC UA: NEGATIVE
SPEC GRAV UA: 1.025 (ref 1.010–1.025)
Urobilinogen, UA: 1 E.U./dL
pH, UA: 6 (ref 5.0–8.0)

## 2018-09-27 MED ORDER — FLUCONAZOLE 150 MG PO TABS: ORAL_TABLET | ORAL | 0 refills | Status: DC

## 2018-09-27 NOTE — Progress Notes (Signed)
   Subjective:    Patient ID: Lisa Robles, female    DOB: Feb 04, 1946, 72 y.o.   MRN: [id number]  HPI   Patient presents to clinic complaining of strong smelling urine that happened about a week ago with some burning.  Patient states that strong smelling urine has improved because she took some Azo and cranberry supplements over-the-counter.  Patient still has some itching and thin white discharge vaginal area.  Patient believes she has a yeast infection.  Denies fever or chills.  Denies nausea, vomiting or diarrhea.  Patient Active Problem List   Diagnosis Date Noted  . Acute cystitis with hematuria 08/22/2017  . Health care maintenance 04/20/2015  . Abnormal Pap smear of cervix 05/19/2014  . Numbness and tingling in left hand 12/21/2013  . GERD (gastroesophageal reflux disease) 11/14/2012  . Hypertension 11/14/2012  . Hypercholesterolemia 11/14/2012  . Hyperglycemia 11/14/2012  . Anemia 11/14/2012  . B12 deficiency 11/14/2012   Social History   Tobacco Use  . Smoking status: Former Smoker    Last attempt to quit: 12/04/1977    Years since quitting: 40.8  . Smokeless tobacco: Never Used  Substance Use Topics  . Alcohol use: No    Alcohol/week: 0.0 standard drinks   Review of Systems  Constitutional: Negative for chills, fatigue and fever.  HENT: Negative for congestion, ear pain, sinus pain and sore throat.   Eyes: Negative.   Respiratory: Negative for cough, shortness of breath and wheezing.   Cardiovascular: Negative for chest pain, palpitations and leg swelling.  Gastrointestinal: Negative for abdominal pain, diarrhea, nausea and vomiting.  Genitourinary: +vaginal itch. Foul smelling urine, better now  Musculoskeletal: Negative for arthralgias and myalgias.  Skin: Negative for color change, pallor and rash.  Neurological: Negative for syncope, light-headedness and headaches.  Psychiatric/Behavioral: The patient is not nervous/anxious.       Objective:   Physical Exam  Constitutional: She is oriented to person, place, and time. She appears well-nourished. No distress.  HENT:  Head: Normocephalic and atraumatic.  Cardiovascular: Normal rate and regular rhythm.  Pulmonary/Chest: Effort normal and breath sounds normal. No respiratory distress.  Abdominal: Soft. Bowel sounds are normal. She exhibits no distension and no mass. There is no tenderness. There is no rebound and no guarding.  Genitourinary:  Genitourinary Comments: Pelvic exam declined in clinic today  Musculoskeletal: She exhibits no edema.  Gait normal  Neurological: She is alert and oriented to person, place, and time.  Skin: Skin is warm and dry. No pallor.  Psychiatric: She has a normal mood and affect. Her behavior is normal.  Nursing note and vitals reviewed.     Vitals:   09/27/18 1007  BP: 120/74  Pulse: 78  Temp: 98.3 F (36.8 C)  SpO2: 92%   Assessment & Plan:   Foul-smelling urine - urinalysis is unremarkable for any acute urinary tract infection.  We will also send urine out for culture to make sure no bacteria grows.  Vaginal itch/vaginal yeast infection - patient will take Diflucan x2 doses to treat yeast infection.  Patient advised that if symptoms do not resolve with this treatment, she needs to return to clinic to have full pelvic exam.  Also advised to increase fluids, do good handwashing, wear cotton underwear.  Keep regularly scheduled follow-up as planned.  Return to clinic sooner if current symptoms persist or worsen

## 2018-09-28 LAB — URINE CULTURE
MICRO NUMBER:: 91286285
Result:: NO GROWTH
SPECIMEN QUALITY: ADEQUATE

## 2018-10-22 ENCOUNTER — Ambulatory Visit (INDEPENDENT_AMBULATORY_CARE_PROVIDER_SITE_OTHER): Payer: Medicare Other | Admitting: *Deleted

## 2018-10-22 ENCOUNTER — Telehealth: Payer: Self-pay | Admitting: *Deleted

## 2018-10-22 DIAGNOSIS — E538 Deficiency of other specified B group vitamins: Secondary | ICD-10-CM

## 2018-10-22 MED ORDER — SCOPOLAMINE 1 MG/3DAYS TD PT72: 1.0000 | MEDICATED_PATCH | TRANSDERMAL | 0 refills | Status: DC

## 2018-10-22 MED ORDER — CYANOCOBALAMIN 1000 MCG/ML IJ SOLN
1000.0000 ug | Freq: Once | INTRAMUSCULAR | Status: AC
  Administered 2018-10-22: 1000 ug via INTRAMUSCULAR

## 2018-10-22 NOTE — Telephone Encounter (Signed)
Patient is leaving for a cruise on Saturday 10/26/18 she ask if PCP would send script in for scopolamine patches for her cruise?

## 2018-10-22 NOTE — Progress Notes (Signed)
Patient presented for B 12 injection to left deltoid, patient voiced no concerns nor showed any signs of distress during injection. 

## 2018-10-22 NOTE — Telephone Encounter (Signed)
rx sent in for scopolamine patches.  

## 2018-10-23 NOTE — Telephone Encounter (Signed)
Left message letting patient know that patches have been sent in

## 2018-11-11 ENCOUNTER — Encounter: Payer: Self-pay | Admitting: Family Medicine

## 2018-11-11 ENCOUNTER — Ambulatory Visit: Payer: Medicare Other | Admitting: Family Medicine

## 2018-11-11 ENCOUNTER — Other Ambulatory Visit: Payer: Self-pay

## 2018-11-11 ENCOUNTER — Ambulatory Visit: Payer: Self-pay

## 2018-11-11 VITALS — BP 122/80 | HR 74 | Temp 97.9°F | Ht 63.0 in | Wt 179.8 lb

## 2018-11-11 DIAGNOSIS — J019 Acute sinusitis, unspecified: Secondary | ICD-10-CM

## 2018-11-11 DIAGNOSIS — N644 Mastodynia: Secondary | ICD-10-CM | POA: Diagnosis not present

## 2018-11-11 MED ORDER — AMOXICILLIN-POT CLAVULANATE 875-125 MG PO TABS: 1.0000 | ORAL_TABLET | Freq: Two times a day (BID) | ORAL | 0 refills | Status: DC

## 2018-11-11 NOTE — Telephone Encounter (Signed)
Pt. Reports she has flecks of blood in her phlegm when she "clears her "throat. No cough and no nasal discharge. No fever. Feels like she does have sinus congestion and post nasal drip. Is not vomiting blood.Request an appointment after 4. Appointment made.Instructed if symptoms worsen to go to ED.  Reason for Disposition . [1] Nasal discharge AND [2] present > 10 days  Answer Assessment - Initial Assessment Questions 1. ONSET: "When did you start coughing up blood?"     Started yesterday 2. SEVERITY: "How many times?" "How much blood?" (e.g., flecks, streaks, tablespoons, etc)     Like a nose bleed 3. COUGHING SPASMS: "Did the blood appear after a coughing spell?"       No cough 4. RESPIRATORY DISTRESS: "Describe your breathing."      No distress 5. FEVER: "Do you have a fever?" If so, ask: "What is your temperature, how was it measured, and when did it start?"     No 6. SPUTUM: "Describe the color of your sputum" (clear, white, yellow, green), "Has there been any change recently?"     Clear 7. CARDIAC HISTORY: "Do you have any history of heart disease?" (e.g., heart attack, congestive heart failure)      HTN 8. LUNG HISTORY: "Do you have any history of lung disease?"  (e.g., pulmonary embolus, asthma, emphysema)     Asthma 9. PE RISK FACTORS: "Do you have a history of blood clots?" (or: recent major surgery, recent prolonged travel, bedridden)     No 10. OTHER SYMPTOMS: "Do you have any other symptoms?" (e.g., nosebleed, chest pain, abdominal pain, vomiting)       Sinus congestion 11. PREGNANCY: "Is there any chance you are pregnant?" "When was your last menstrual period?"       No 12. TRAVEL: "Have you traveled out of the country in the last month?" (e.g., travel history, exposures)       Yes- DR  Protocols used: COUGHING UP BLOOD-A-AH

## 2018-11-11 NOTE — Telephone Encounter (Signed)
Has Appointment with NP at 1620 today FYI.

## 2018-11-11 NOTE — Progress Notes (Signed)
Subjective:    Patient ID: Lisa Robles, female    DOB: [birth year], 72 y.o.   MRN: [id number]  HPI  Presents to clinic c/o blood in phegm (flecks of blood) for 2 days and sinus pressure/congestion for 3 weeks.  Patient states she has been working hard to try to reduce congestion with saline nasal rinses, using Nasacort prescribed by the ENT and taking Claritin every day.  However, the mucus has only gotten thicker and the past couple of days it did have blood when she blew her nose.  Patient denies fever or chills.  Denies nausea, vomiting or diarrhea.  Denies shortness of breath, cough or wheezing.  Patient also complains of right breast pain off and on for the past couple of weeks.  She did have a mammogram back in May 2019 which was normal.  Patient denies any discharge from her nipple, any changes in breast skin or feeling any masses with self breast exam.  Patient Active Problem List   Diagnosis Date Noted  . Acute cystitis with hematuria 08/22/2017  . Health care maintenance 04/20/2015  . Abnormal Pap smear of cervix 05/19/2014  . Numbness and tingling in left hand 12/21/2013  . GERD (gastroesophageal reflux disease) 11/14/2012  . Hypertension 11/14/2012  . Hypercholesterolemia 11/14/2012  . Hyperglycemia 11/14/2012  . Anemia 11/14/2012  . B12 deficiency 11/14/2012   Social History   Tobacco Use  . Smoking status: Former Smoker    Last attempt to quit: 12/04/1977    Years since quitting: 40.9  . Smokeless tobacco: Never Used  Substance Use Topics  . Alcohol use: No    Alcohol/week: 0.0 standard drinks   Review of Systems  Constitutional: Negative for chills, fatigue and fever.  HENT: +congestion, sinus pain, sinus drainage.   Eyes: Negative.   Respiratory: Negative for cough, shortness of breath and wheezing.   Cardiovascular: Negative for chest pain, palpitations and leg swelling.  Breast: Right breast pain Gastrointestinal: Negative for abdominal pain, diarrhea,  nausea and vomiting.  Genitourinary: Negative for dysuria, frequency and urgency.  Musculoskeletal: Negative for arthralgias and myalgias.  Skin: Negative for color change, pallor and rash.  Neurological: Negative for syncope, light-headedness and headaches.  Psychiatric/Behavioral: The patient is not nervous/anxious.       Objective:   Physical Exam  Constitutional: She is oriented to person, place, and time. She appears well-nourished. No distress.  HENT:  Head: Normocephalic and atraumatic.  Nose: Mucosal edema, rhinorrhea and nose lacerations present. Right sinus exhibits maxillary sinus tenderness and frontal sinus tenderness. Left sinus exhibits maxillary sinus tenderness and frontal sinus tenderness.  Thick yellow-green nasal drainage  Eyes: Conjunctivae and EOM are normal. No scleral icterus.  Neck: Neck supple. No tracheal deviation present.  Cardiovascular: Normal rate and regular rhythm.  Pulmonary/Chest: Effort normal and breath sounds normal. No respiratory distress. Right breast exhibits no inverted nipple, no mass, no nipple discharge, no skin change and no tenderness. Left breast exhibits no inverted nipple, no mass, no nipple discharge, no skin change and no tenderness.  Musculoskeletal: She exhibits no edema.  Neurological: She is alert and oriented to person, place, and time.  Skin: Skin is warm and dry. No pallor.  Psychiatric: She has a normal mood and affect. Her behavior is normal.      Vitals:   11/11/18 1612  BP: 122/80  Pulse: 74  Temp: 97.9 F (36.6 C)  SpO2: 95%    Assessment & Plan:   Sinusitis-patient will take Augmentin twice  daily for 10 days.  She will continue doing her Claritin, saline nasal rinses and Nasacort spray as she has been.  Patient advised to increase fluids, rest and do good handwashing.  Right breast pain- breast exam is unremarkable and mammogram from May 2019 also is normal.  Patient states the pain has not happened in a few  days, so she will monitor and if pain recurs she will let us know will think about possibly getting a diagnostic mammogram.  Keep regularly scheduled with PCP as planned.  Return to clinic sooner if any issues arise.

## 2018-11-12 ENCOUNTER — Encounter: Payer: Self-pay | Admitting: Family Medicine

## 2018-11-26 ENCOUNTER — Ambulatory Visit: Payer: Medicare Other | Admitting: Family Medicine

## 2018-11-26 ENCOUNTER — Ambulatory Visit (INDEPENDENT_AMBULATORY_CARE_PROVIDER_SITE_OTHER): Payer: Medicare Other

## 2018-11-26 ENCOUNTER — Encounter: Payer: Self-pay | Admitting: Family Medicine

## 2018-11-26 VITALS — BP 122/86 | HR 72 | Temp 99.0°F | Ht 63.0 in | Wt 179.0 lb

## 2018-11-26 DIAGNOSIS — N390 Urinary tract infection, site not specified: Secondary | ICD-10-CM | POA: Diagnosis not present

## 2018-11-26 DIAGNOSIS — B3731 Acute candidiasis of vulva and vagina: Secondary | ICD-10-CM

## 2018-11-26 DIAGNOSIS — B373 Candidiasis of vulva and vagina: Secondary | ICD-10-CM | POA: Diagnosis not present

## 2018-11-26 DIAGNOSIS — E538 Deficiency of other specified B group vitamins: Secondary | ICD-10-CM | POA: Diagnosis not present

## 2018-11-26 LAB — POCT URINALYSIS DIPSTICK
BILIRUBIN UA: NEGATIVE
Glucose, UA: NEGATIVE
KETONES UA: NEGATIVE
NITRITE UA: NEGATIVE
PH UA: 6 (ref 5.0–8.0)
Protein, UA: NEGATIVE
RBC UA: 10
Spec Grav, UA: 1.025 (ref 1.010–1.025)
UROBILINOGEN UA: 0.2 U/dL

## 2018-11-26 MED ORDER — FLUCONAZOLE 150 MG PO TABS: ORAL_TABLET | ORAL | 0 refills | Status: DC

## 2018-11-26 MED ORDER — CIPROFLOXACIN HCL 250 MG PO TABS: 500.0000 mg | ORAL_TABLET | Freq: Two times a day (BID) | ORAL | 0 refills | Status: DC

## 2018-11-26 MED ORDER — CYANOCOBALAMIN 1000 MCG/ML IJ SOLN
1000.0000 ug | Freq: Once | INTRAMUSCULAR | Status: AC
Start: 2018-11-26 — End: 2018-11-26
  Administered 2018-11-26: 1000 ug via INTRAMUSCULAR

## 2018-11-26 NOTE — Progress Notes (Signed)
Subjective:    Patient ID: Lisa Robles, female    DOB: 09-08-1946, 72 y.o.   MRN: [id number]  HPI   Patient presents to clinic with burning and pressure with urination, pain especially at end of urinary stream, foul smelling urine and vaginal itching for past 3-4 days.  She did finish antibiotic about 1 week ago for sinus infection and states she often will get a yeast infection after antibiotic. She believes this is the reason for itching.   Pain she denies fever or chills.  Patient denies nausea, vomiting or diarrhea.  Patient's appetite has been normal.  Patient Active Problem List   Diagnosis Date Noted  . Acute cystitis with hematuria 08/22/2017  . Health care maintenance 04/20/2015  . Abnormal Pap smear of cervix 05/19/2014  . Numbness and tingling in left hand 12/21/2013  . GERD (gastroesophageal reflux disease) 11/14/2012  . Hypertension 11/14/2012  . Hypercholesterolemia 11/14/2012  . Hyperglycemia 11/14/2012  . Anemia 11/14/2012  . B12 deficiency 11/14/2012   Social History   Tobacco Use  . Smoking status: Former Smoker    Last attempt to quit: 12/04/1977    Years since quitting: 41.0  . Smokeless tobacco: Never Used  Substance Use Topics  . Alcohol use: No    Alcohol/week: 0.0 standard drinks   Review of Systems  Constitutional: Negative for chills, fatigue and fever.  HENT: Negative for congestion, ear pain, sinus pain and sore throat.   Eyes: Negative.   Respiratory: Negative for cough, shortness of breath and wheezing.   Cardiovascular: Negative for chest pain, palpitations and leg swelling.  Gastrointestinal: Negative for abdominal pain, diarrhea, nausea and vomiting.  Genitourinary: +dysuria, frequency and urgency & foul smelling urine. +vaginal itch. Musculoskeletal: Negative for arthralgias and myalgias.  Skin: Negative for color change, pallor and rash.  Neurological: Negative for syncope, light-headedness and headaches.    Psychiatric/Behavioral: The patient is not nervous/anxious.       Objective:   Physical Exam Vitals signs and nursing note reviewed.  Constitutional:      Appearance: Normal appearance. She is not toxic-appearing or diaphoretic.  HENT:     Head: Normocephalic and atraumatic.  Eyes:     General: No scleral icterus.    Extraocular Movements: Extraocular movements intact.     Conjunctiva/sclera: Conjunctivae normal.  Neck:     Musculoskeletal: Normal range of motion and neck supple.  Cardiovascular:     Rate and Rhythm: Normal rate and regular rhythm.     Heart sounds: Normal heart sounds.  Pulmonary:     Effort: Pulmonary effort is normal. No respiratory distress.     Breath sounds: Normal breath sounds.  Abdominal:     General: Bowel sounds are normal.     Palpations: Abdomen is soft.     Tenderness: There is abdominal tenderness (mild left sided suprapubic tenderness. ).  Genitourinary:    Comments: Declines pelvic exam in office today Musculoskeletal:     Left lower leg: No edema.  Skin:    General: Skin is warm and dry.     Coloration: Skin is not jaundiced or pale.  Neurological:     Mental Status: She is alert and oriented to person, place, and time.  Psychiatric:        Mood and Affect: Mood normal.        Behavior: Behavior normal.       Vitals:   11/26/18 0934  BP: 122/86  Pulse: 72  Temp: 99 F (37.2 C)  SpO2: 98%   Assessment & Plan:   UTI - due to leukocytes seen on urinalysis dipstick and foul smell to urine in combination patient symptoms we will treat for UTI.  She will take Cipro twice daily for 5 days.  She has been advised to increase water intake avoid excess sugary/caffeinated beverages and wear cotton underwear.  Patient also advised to be sure to wipe front to back after using the restroom, avoid holding urine for extended periods of time and also can trial not wearing underwear to allow skin in genital area sometimes to air out.  Vaginal  Candida - I suspect itching is related to a yeast infection.  Patient will take Diflucan course.  Advised patient that if Diflucan does not work to help improve itching symptom, she will have to return to clinic for a full pelvic exam.  Patient will keep regularly scheduled follow-up with PCP as planned.  Advised to return to clinic sooner if any issues arise.

## 2018-11-26 NOTE — Progress Notes (Signed)
Pt was seen today for a NV for B-12 shot given IM in the RD. Pt tolerated well.    

## 2018-11-26 NOTE — Patient Instructions (Signed)
Urinary Tract Infection, Adult A urinary tract infection (UTI) is an infection of any part of the urinary tract. The urinary tract includes:  The kidneys.  The ureters.  The bladder.  The urethra. These organs make, store, and get rid of pee (urine) in the body. What are the causes? This is caused by germs (bacteria) in your genital area. These germs grow and cause swelling (inflammation) of your urinary tract. What increases the risk? You are more likely to develop this condition if:  You have a small, thin tube (catheter) to drain pee.  You cannot control when you pee or poop (incontinence).  You are female, and: ? You use these methods to prevent pregnancy: ? A medicine that kills sperm (spermicide). ? A device that blocks sperm (diaphragm). ? You have low levels of a female hormone (estrogen). ? You are pregnant.  You have genes that add to your risk.  You are sexually active.  You take antibiotic medicines.  You have trouble peeing because of: ? A prostate that is bigger than normal, if you are female. ? A blockage in the part of your body that drains pee from the bladder (urethra). ? A kidney stone. ? A nerve condition that affects your bladder (neurogenic bladder). ? Not getting enough to drink. ? Not peeing often enough.  You have other conditions, such as: ? Diabetes. ? A weak disease-fighting system (immune system). ? Sickle cell disease. ? Gout. ? Injury of the spine. What are the signs or symptoms? Symptoms of this condition include:  Needing to pee right away (urgently).  Peeing often.  Peeing small amounts often.  Pain or burning when peeing.  Blood in the pee.  Pee that smells bad or not like normal.  Trouble peeing.  Pee that is cloudy.  Fluid coming from the vagina, if you are female.  Pain in the belly or lower back. Other symptoms include:  Throwing up (vomiting).  No urge to eat.  Feeling mixed up (confused).  Being tired  and grouchy (irritable).  A fever.  Watery poop (diarrhea). How is this treated? This condition may be treated with:  Antibiotic medicine.  Other medicines.  Drinking enough water. Follow these instructions at home:  Medicines  Take over-the-counter and prescription medicines only as told by your doctor.  If you were prescribed an antibiotic medicine, take it as told by your doctor. Do not stop taking it even if you start to feel better. General instructions  Make sure you: ? Pee until your bladder is empty. ? Do not hold pee for a long time. ? Empty your bladder after sex. ? Wipe from front to back after pooping if you are a female. Use each tissue one time when you wipe.  Drink enough fluid to keep your pee pale yellow.  Keep all follow-up visits as told by your doctor. This is important. Contact a doctor if:  You do not get better after 1-2 days.  Your symptoms go away and then come back. Get help right away if:  You have very bad back pain.  You have very bad pain in your lower belly.  You have a fever.  You are sick to your stomach (nauseous).  You are throwing up. Summary  A urinary tract infection (UTI) is an infection of any part of the urinary tract.  This condition is caused by germs in your genital area.  There are many risk factors for a UTI. These include having a small, thin   tube to drain pee and not being able to control when you pee or poop.  Treatment includes antibiotic medicines for germs.  Drink enough fluid to keep your pee pale yellow. This information is not intended to replace advice given to you by your health care provider. Make sure you discuss any questions you have with your health care provider. Document Released: 05/08/2008 Document Revised: 05/30/2018 Document Reviewed: 05/30/2018 Elsevier Interactive Patient Education  2019 [employer]  

## 2018-11-28 LAB — URINE CULTURE
MICRO NUMBER: 91538767
SPECIMEN QUALITY:: ADEQUATE

## 2018-12-02 ENCOUNTER — Ambulatory Visit
Admission: EM | Admit: 2018-12-02 | Discharge: 2018-12-02 | Disposition: A | Discharge status: Home / Self Care | Payer: Medicare Other | Attending: Family Medicine | Admitting: Family Medicine

## 2018-12-02 ENCOUNTER — Ambulatory Visit: Payer: Self-pay

## 2018-12-02 ENCOUNTER — Other Ambulatory Visit: Payer: Self-pay

## 2018-12-02 DIAGNOSIS — I10 Essential (primary) hypertension: Secondary | ICD-10-CM

## 2018-12-02 DIAGNOSIS — R42 Dizziness and giddiness: Secondary | ICD-10-CM | POA: Diagnosis not present

## 2018-12-02 MED ORDER — MECLIZINE HCL 25 MG PO TABS: 25.0000 mg | ORAL_TABLET | Freq: Three times a day (TID) | ORAL | 0 refills | Status: AC | PRN

## 2018-12-02 NOTE — ED Provider Notes (Signed)
[facility]    CSN: [id number] Arrival date & time: 12/02/18  1605  History   Chief Complaint Dizziness  HPI   72 year old female presents with dizziness.  Patient reports that she has recently been placed on antibiotics for sore throat as well as UTI.  She is unsure if this has contributed to her symptoms.  Patient states that on the day after Christmas she had a bout of what she believes is vertigo.  She states that it was "spinning".  She subsequently fell and hit her hip/buttock on the nightstand.  Patient states that she has had intermittent lightheadedness as of late.  She had no other episodes of vertigo.  She currently feels fine.  Patient states that she contacted her physician's office and was told to come in for evaluation.  No other associated symptoms.  No other complaints  Of note, patient did not hit her head.  PMH, Surgical Hx, Family Hx, Social History reviewed and updated as below.  Past Medical History:  Diagnosis Date  . GERD (gastroesophageal reflux disease)   . Hypercholesteremia   . Hypertension     Patient Active Problem List   Diagnosis Date Noted  . Acute cystitis with hematuria 08/22/2017  . Health care maintenance 04/20/2015  . Abnormal Pap smear of cervix 05/19/2014  . Numbness and tingling in left hand 12/21/2013  . GERD (gastroesophageal reflux disease) 11/14/2012  . Hypertension 11/14/2012  . Hypercholesterolemia 11/14/2012  . Hyperglycemia 11/14/2012  . Anemia 11/14/2012  . B12 deficiency 11/14/2012    Past Surgical History:  Procedure Laterality Date  . BREAST BIOPSY    . TOOTH EXTRACTION     tooth implantation  . TUBAL LIGATION  1979    OB History    Gravida  3   Para  3   Term      Preterm      AB      Living        SAB      TAB      Ectopic      Multiple      Live Births               Home Medications    Prior to Admission medications   Medication Sig Start Date End Date Taking?  Authorizing Provider  albuterol (PROVENTIL HFA;VENTOLIN HFA) 108 (90 Base) MCG/ACT inhaler Inhale 2 puffs into the lungs every 6 (six) hours as needed. 03/12/18  Yes Sakia Schrimpf G, DO  aspirin 81 MG tablet Take 81 mg by mouth daily.   Yes [provider]  fluticasone (FLONASE) 50 MCG/ACT nasal spray Place 2 sprays into both nostrils daily. 10/16/13  Yes Dale [place], MD  Fluticasone-Salmeterol (ADVAIR DISKUS) 100-50 MCG/DOSE AEPB inhale 1 dose by mouth twice a day 03/12/18  Yes Azaryah Heathcock G, DO  guaiFENesin (MUCINEX) 600 MG 12 hr tablet Take by mouth 2 (two) times daily.   Yes [provider]  hydrochlorothiazide (HYDRODIURIL) 25 MG tablet Take 1 tablet (25 mg total) by mouth daily. 06/24/18  Yes Dale Diamond, MD  loratadine (CLARITIN) 10 MG tablet Take 10 mg by mouth daily.   Yes [provider]  magic mouthwash SOLN Take 5 mLs by mouth 3 (three) times daily as needed for mouth pain. 05/22/18  Yes Sherlene Shams, MD  rosuvastatin (CRESTOR) 10 MG tablet Take 1 tablet (10 mg total) by mouth daily. 01/24/18  Yes Dale Greenleaf, MD  scopolamine (TRANSDERM-SCOP, 1.5 MG,) 1 MG/3DAYS  Place 1 patch (1.5 mg total) onto the skin every 3 (three) days. 10/22/18  Yes Dale DurhamScott, Charlene, MD  meclizine (ANTIVERT) 25 MG tablet Take 1 tablet (25 mg total) by mouth 3 (three) times daily as needed for dizziness. 12/02/18   Tommie Samsook, Moriah Loughry G, DO    Family History Family History  Problem Relation Age of Onset  . Heart attack Mother   . Heart attack Father   . Hypercholesterolemia Brother   . Cancer Maternal Aunt        breast  . Colon cancer Neg Hx   . Breast cancer Neg Hx     Social History Social History   Tobacco Use  . Smoking status: Former Smoker    Last attempt to quit: 12/04/1977    Years since quitting: 41.0  . Smokeless tobacco: Never Used  Substance Use Topics  . Alcohol use: No    Alcohol/week: 0.0 standard drinks  . Drug use: No     Allergies   Cinnamon; Bactrim  [sulfamethoxazole-trimethoprim]; and Shellfish allergy   Review of Systems Review of Systems  Constitutional: Negative.   Neurological: Positive for dizziness.   Physical Exam Triage Vital Signs ED Triage Vitals  Enc Vitals Group     BP 12/02/18 1735 (!) 155/94     Pulse Rate 12/02/18 1735 76     Resp 12/02/18 1735 18     Temp 12/02/18 1735 98.1 F (36.7 C)     Temp Source 12/02/18 1735 Oral     SpO2 12/02/18 1735 99 %     Weight 12/02/18 1733 179 lb (81.2 kg)     Height 12/02/18 1733 5\' 3"  (1.6 m)     Head Circumference --      Peak Flow --      Pain Score 12/02/18 1733 4     Pain Loc --      Pain Edu? --      Excl. in GC? --    Updated Vital Signs BP (!) 155/94 (BP Location: Left Arm)   Pulse 76   Temp 98.1 F (36.7 C) (Oral)   Resp 18   Ht 5\' 3"  (1.6 m)   Wt 81.2 kg   SpO2 99%   BMI 31.71 kg/m   Visual Acuity Right Eye Distance:   Left Eye Distance:   Bilateral Distance:    Right Eye Near:   Left Eye Near:    Bilateral Near:     Physical Exam Constitutional:      General: She is not in acute distress. HENT:     Head: Normocephalic and atraumatic.     Right Ear: Tympanic membrane normal.     Left Ear: Tympanic membrane normal.  Eyes:     Extraocular Movements: Extraocular movements intact.     Conjunctiva/sclera: Conjunctivae normal.     Pupils: Pupils are equal, round, and reactive to light.  Cardiovascular:     Rate and Rhythm: Normal rate and regular rhythm.  Pulmonary:     Effort: Pulmonary effort is normal.     Breath sounds: No wheezing, rhonchi or rales.  Neurological:     General: No focal deficit present.     Mental Status: She is alert.  Psychiatric:        Mood and Affect: Mood normal.        Behavior: Behavior normal.    UC Treatments / Results  Labs (all labs ordered are listed, but only abnormal results are displayed) Labs Reviewed - No data to  display  EKG None  Radiology No results found.  Procedures Procedures  (including critical care time)  Medications Ordered in UC Medications - No data to display  Initial Impression / Assessment and Plan / UC Course  I have reviewed the triage vital signs and the nursing notes.  Pertinent labs & imaging results that were available during my care of the patient were reviewed by me and considered in my medical decision making (see chart for details).    72 year old female presents with transient episode of vertigo.  She is well-appearing at this time.  Meclizine as needed.  Supportive care.  Final Clinical Impressions(s) / UC Diagnoses   Final diagnoses:  Vertigo   Discharge Instructions   None    ED Prescriptions    Medication Sig Dispense Auth. Provider   meclizine (ANTIVERT) 25 MG tablet Take 1 tablet (25 mg total) by mouth 3 (three) times daily as needed for dizziness. 30 tablet Tommie Samsook, Shiah Berhow G, DO     Controlled Substance Prescriptions [place] Controlled Substance Registry consulted? Not Applicable   Tommie SamsCook, Ura Hausen G, DO 12/02/18 [id number]

## 2018-12-02 NOTE — Telephone Encounter (Signed)
Called patient gave PCP advice she is going to UC in [place] for dizziness.

## 2018-12-02 NOTE — Telephone Encounter (Signed)
Incoming call from Patient with complaint of falling day after Christmas.  Patient was seen in office Christmas Eve.  Diagnosed with UTI.  Was put on Cipro.  Recently fell  After showering, bumped her, hip on night stand.  Has been feeling lightheaded since the fall. Patient states she feels like if vertigo.  Rate the severity as mild.  Patient states she experienced  Vertigo.  Patient scheduled for  For appointment 12/03/18 @ 1000am.  Patient to arrive at 9:45am voices understanding.    Reason for Disposition . [1] MILD dizziness (e.g., walking normally) AND [2] has NOT been evaluated by physician for this  (Exception: dizziness caused by heat exposure, sudden standing, or poor fluid intake)  Answer Assessment - Initial Assessment Questions 1. DESCRIPTION: "Describe your dizziness."     Light headed 2. LIGHTHEADED: "Do you feel lightheaded?" (e.g., somewhat faint, woozy, weak upon standing)      Plain light headed 3. VERTIGO: "Do you feel like either you or the room is spinning or tilting?" (i.e. vertigo)     Room spinning when I fell not now 4. SEVERITY: "How bad is it?"  "Do you feel like you are going to faint?" "Can you stand and walk?"   - MILD - walking normally   - MODERATE - interferes with normal activities (e.g., work, school)    - SEVERE - unable to stand, requires support to walk, feels like passing out now.      mild 5. ONSET:  "When did the dizziness begin?"     First time 6. AGGRAVATING FACTORS: "Does anything make it worse?" (e.g., standing, change in head position)     When walking after standing 7. HEART RATE: "Can you tell me your heart rate?" "How many beats in 15 seconds?"  (Note: not all patients can do this)       *No Answer* 8. CAUSE: "What do you think is causing the dizziness?"     Cipro, probiotic 9. RECURRENT SYMPTOM: "Have you had dizziness before?" If so, ask: "When was the last time?" "What happened that time?"     Feels like vertigo  Years ago   grearter  than five 10. OTHER SYMPTOMS: "Do you have any other symptoms?" (e.g., fever, chest pain, vomiting, diarrhea, bleeding)      denies 11. PREGNANCY: "Is there any chance you are pregnant?" "When was your last menstrual period?"      na  Protocols used: DIZZINESS [facility]- LIGHTHEADEDNESS-A-AH

## 2018-12-02 NOTE — Telephone Encounter (Signed)
If persistent dizziness after fall - did she hit her head?  I would recommend going ahead and evaluating today to confirm no acute issues (instead of waiting until tomorrow).

## 2018-12-02 NOTE — ED Triage Notes (Signed)
Patient states that she had dizziness on Christmas day while at her sons house. Patient states that she has had vertigo before and this feels similar. States that she has trouble with motion and movement where it reproduces symptoms.

## 2018-12-03 ENCOUNTER — Ambulatory Visit: Payer: Medicare Other | Admitting: Family Medicine

## 2018-12-10 ENCOUNTER — Other Ambulatory Visit: Payer: Self-pay | Admitting: Internal Medicine

## 2018-12-30 ENCOUNTER — Other Ambulatory Visit: Payer: Medicare Other

## 2018-12-31 ENCOUNTER — Other Ambulatory Visit (INDEPENDENT_AMBULATORY_CARE_PROVIDER_SITE_OTHER): Payer: Medicare Other

## 2018-12-31 DIAGNOSIS — R739 Hyperglycemia, unspecified: Secondary | ICD-10-CM | POA: Diagnosis not present

## 2018-12-31 DIAGNOSIS — I10 Essential (primary) hypertension: Secondary | ICD-10-CM | POA: Diagnosis not present

## 2018-12-31 DIAGNOSIS — E7800 Pure hypercholesterolemia, unspecified: Secondary | ICD-10-CM | POA: Diagnosis not present

## 2018-12-31 LAB — HEPATIC FUNCTION PANEL
ALT: 12 U/L (ref 0–35)
AST: 17 U/L (ref 0–37)
Albumin: 4.1 g/dL (ref 3.5–5.2)
Alkaline Phosphatase: 67 U/L (ref 39–117)
Bilirubin, Direct: 0 mg/dL (ref 0.0–0.3)
Total Bilirubin: 0.5 mg/dL (ref 0.2–1.2)
Total Protein: 7.6 g/dL (ref 6.0–8.3)

## 2018-12-31 LAB — LIPID PANEL
CHOLESTEROL: 191 mg/dL (ref 0–200)
HDL: 50.7 mg/dL (ref >39.00)
LDL CALC: 113 mg/dL — AB (ref 0–99)
NonHDL: 140.37
TRIGLYCERIDES: 139 mg/dL (ref 0.0–149.0)
Total CHOL/HDL Ratio: 4
VLDL: 27.8 mg/dL (ref 0.0–40.0)

## 2018-12-31 LAB — BASIC METABOLIC PANEL
BUN: 13 mg/dL (ref 6–23)
CALCIUM: 10.4 mg/dL (ref 8.4–10.5)
CHLORIDE: 99 meq/L (ref 96–112)
CO2: 31 mEq/L (ref 19–32)
CREATININE: 0.79 mg/dL (ref 0.40–1.20)
GFR: 85.94 mL/min (ref >60.00)
Glucose, Bld: 105 mg/dL — ABNORMAL HIGH (ref 70–99)
Potassium: 3.7 mEq/L (ref 3.5–5.1)
Sodium: 136 mEq/L (ref 135–145)

## 2018-12-31 LAB — HEMOGLOBIN A1C: HEMOGLOBIN A1C: 6 % (ref 4.6–6.5)

## 2019-01-03 ENCOUNTER — Ambulatory Visit (INDEPENDENT_AMBULATORY_CARE_PROVIDER_SITE_OTHER): Payer: Medicare Other | Admitting: Internal Medicine

## 2019-01-03 ENCOUNTER — Encounter: Payer: Self-pay | Admitting: Internal Medicine

## 2019-01-03 VITALS — BP 134/86 | HR 76 | Temp 97.8°F | Resp 16 | Ht 63.0 in | Wt 183.6 lb

## 2019-01-03 DIAGNOSIS — R8290 Unspecified abnormal findings in urine: Secondary | ICD-10-CM | POA: Diagnosis not present

## 2019-01-03 DIAGNOSIS — R739 Hyperglycemia, unspecified: Secondary | ICD-10-CM

## 2019-01-03 DIAGNOSIS — Z0000 Encounter for general adult medical examination without abnormal findings: Secondary | ICD-10-CM

## 2019-01-03 DIAGNOSIS — I10 Essential (primary) hypertension: Secondary | ICD-10-CM

## 2019-01-03 DIAGNOSIS — N390 Urinary tract infection, site not specified: Secondary | ICD-10-CM

## 2019-01-03 DIAGNOSIS — D649 Anemia, unspecified: Secondary | ICD-10-CM

## 2019-01-03 DIAGNOSIS — E7800 Pure hypercholesterolemia, unspecified: Secondary | ICD-10-CM | POA: Diagnosis not present

## 2019-01-03 DIAGNOSIS — E538 Deficiency of other specified B group vitamins: Secondary | ICD-10-CM

## 2019-01-03 LAB — URINALYSIS, ROUTINE W REFLEX MICROSCOPIC
BILIRUBIN URINE: NEGATIVE
HGB URINE DIPSTICK: NEGATIVE
KETONES UR: NEGATIVE
Leukocytes, UA: NEGATIVE
NITRITE: NEGATIVE
PH: 6.5 (ref 5.0–8.0)
Specific Gravity, Urine: 1.025 (ref 1.000–1.030)
TOTAL PROTEIN, URINE-UPE24: NEGATIVE
URINE GLUCOSE: NEGATIVE
UROBILINOGEN UA: 0.2 (ref 0.0–1.0)

## 2019-01-03 MED ORDER — CYANOCOBALAMIN 1000 MCG/ML IJ SOLN
1000.0000 ug | Freq: Once | INTRAMUSCULAR | Status: AC
  Administered 2019-01-03: 1000 ug via INTRAMUSCULAR

## 2019-01-03 NOTE — Assessment & Plan Note (Signed)
Physical today 01/03/19.  Mammogram 04/11/18 - Birads I.  Colonoscopy 09/04/11.  Recommended f/u in 10 years.

## 2019-01-03 NOTE — Progress Notes (Signed)
Patient ID: Lisa Robles, female   DOB: Apr 25, 1946, 73 y.o.   MRN: [id number]   Subjective:    Patient ID: Lisa Robles, female    DOB: 1946/05/23, 73 y.o.   MRN: [id number]  HPI  Patient here for her physical exam.  She reports she is doing relatively well.  Trying to stay active.  Is substitute teaching.  Enjoys this.  No chest pain.  No sob.  No acid reflux.  No abdominal pain.  Bowels moving.  Has had issues with UTIs recently.  Discussed her frequent UTIs and urinary symptoms.  She reports now just noticing an odor to her urine.  No burning with urination.  No vaginal discharge or itching.  No abdominal  Pain.  Has been walking.     Past Medical History:  Diagnosis Date  . GERD (gastroesophageal reflux disease)   . Hypercholesteremia   . Hypertension    Past Surgical History:  Procedure Laterality Date  . BREAST BIOPSY    . TOOTH EXTRACTION     tooth implantation  . TUBAL LIGATION  1979   Family History  Problem Relation Age of Onset  . Heart attack Mother   . Heart attack Father   . Hypercholesterolemia Brother   . Cancer Maternal Aunt        breast  . Colon cancer Neg Hx   . Breast cancer Neg Hx    Social History   Socioeconomic History  . Marital status: Widowed    Spouse name: Not on file  . Number of children: 2  . Years of education: masters  . Highest education level: Not on file  Occupational History  . Occupation: retired Tour manager  . Financial resource strain: Not on file  . Food insecurity:    Worry: Not on file    Inability: Not on file  . Transportation needs:    Medical: Not on file    Non-medical: Not on file  Tobacco Use  . Smoking status: Former Smoker    Last attempt to quit: 12/04/1977    Years since quitting: 41.1  . Smokeless tobacco: Never Used  Substance and Sexual Activity  . Alcohol use: No    Alcohol/week: 0.0 standard drinks  . Drug use: No  . Sexual activity: Yes    Comment: Is sexually active wih same  partner x 14 years. Recently found out partner has been cheating.  Lifestyle  . Physical activity:    Days per week: 0 days    Minutes per session: Not on file  . Stress: Not on file  Relationships  . Social connections:    Talks on phone: Not on file    Gets together: Not on file    Attends religious service: Not on file    Active member of club or organization: Not on file    Attends meetings of clubs or organizations: Not on file    Relationship status: Not on file  Other Topics Concern  . Not on file  Social History Narrative  . Not on file    Outpatient Encounter Medications as of 01/03/2019  Medication Sig  . albuterol (PROVENTIL HFA;VENTOLIN HFA) 108 (90 Base) MCG/ACT inhaler Inhale 2 puffs into the lungs every 6 (six) hours as needed.  Marland Kitchen aspirin 81 MG tablet Take 81 mg by mouth daily.  . fluticasone (FLONASE) 50 MCG/ACT nasal spray Place 2 sprays into both nostrils daily.  . Fluticasone-Salmeterol (ADVAIR DISKUS) 100-50 MCG/DOSE AEPB inhale 1 dose  by mouth twice a day  . guaiFENesin (MUCINEX) 600 MG 12 hr tablet Take by mouth 2 (two) times daily.  . hydrochlorothiazide (HYDRODIURIL) 25 MG tablet TAKE 1 TABLET(25 MG) BY MOUTH DAILY  . loratadine (CLARITIN) 10 MG tablet Take 10 mg by mouth daily.  . magic mouthwash SOLN Take 5 mLs by mouth 3 (three) times daily as needed for mouth pain.  . meclizine (ANTIVERT) 25 MG tablet Take 1 tablet (25 mg total) by mouth 3 (three) times daily as needed for dizziness.  . rosuvastatin (CRESTOR) 10 MG tablet Take 1 tablet (10 mg total) by mouth daily.  Marland Kitchen scopolamine (TRANSDERM-SCOP, 1.5 MG,) 1 MG/3DAYS Place 1 patch (1.5 mg total) onto the skin every 3 (three) days.  . [EXPIRED] cyanocobalamin ((VITAMIN B-12)) injection 1,000 mcg    No facility-administered encounter medications on file as of 01/03/2019.     Review of Systems  Constitutional: Negative for appetite change and unexpected weight change.  HENT: Negative for congestion and  sinus pressure.   Eyes: Negative for pain and visual disturbance.  Respiratory: Negative for cough, chest tightness and shortness of breath.   Cardiovascular: Negative for chest pain, palpitations and leg swelling.  Gastrointestinal: Negative for abdominal pain, diarrhea and nausea.  Genitourinary: Negative for difficulty urinating and dysuria.       Bad odor - urine.    Musculoskeletal: Negative for joint swelling and myalgias.  Skin: Negative for color change and rash.  Neurological: Negative for dizziness, light-headedness and headaches.  Hematological: Negative for adenopathy. Does not bruise/bleed easily.  Psychiatric/Behavioral: Negative for agitation and dysphoric mood.       Objective:    Physical Exam Constitutional:      General: She is not in acute distress.    Appearance: Normal appearance. She is well-developed.  HENT:     Nose: Nose normal. No congestion.     Mouth/Throat:     Pharynx: No oropharyngeal exudate or posterior oropharyngeal erythema.  Eyes:     General: No scleral icterus.       Right eye: No discharge.        Left eye: No discharge.  Neck:     Musculoskeletal: Neck supple. No muscular tenderness.     Thyroid: No thyromegaly.  Cardiovascular:     Rate and Rhythm: Normal rate and regular rhythm.  Pulmonary:     Effort: No tachypnea, accessory muscle usage or respiratory distress.     Breath sounds: Normal breath sounds. No decreased breath sounds or wheezing.  Chest:     Breasts:        Right: No inverted nipple, mass, nipple discharge or tenderness (no axillary adenopathy).        Left: No inverted nipple, mass, nipple discharge or tenderness (no axilarry adenopathy).  Abdominal:     General: Bowel sounds are normal.     Palpations: Abdomen is soft.     Tenderness: There is no abdominal tenderness.  Musculoskeletal:        General: No swelling or tenderness.  Lymphadenopathy:     Cervical: No cervical adenopathy.  Skin:    Findings: No  erythema or rash.  Neurological:     Mental Status: She is alert and oriented to person, place, and time.  Psychiatric:        Mood and Affect: Mood normal.        Behavior: Behavior normal.     BP 134/86 (BP Location: Left Arm, Patient Position: Sitting, Cuff Size: Normal)  Pulse 76   Temp 97.8 F (36.6 C) (Oral)   Resp 16   Ht '5\' 3"'  (1.6 m)   Wt 183 lb 9.6 oz (83.3 kg)   SpO2 97%   BMI 32.52 kg/m  Wt Readings from Last 3 Encounters:  01/03/19 183 lb 9.6 oz (83.3 kg)  12/02/18 179 lb (81.2 kg)  11/26/18 179 lb (81.2 kg)     Lab Results  Component Value Date   WBC 7.4 01/21/2018   HGB 13.5 01/21/2018   HCT 40.8 01/21/2018   PLT 338.0 01/21/2018   GLUCOSE 105 (H) 12/31/2018   CHOL 191 12/31/2018   TRIG 139.0 12/31/2018   HDL 50.70 12/31/2018   LDLDIRECT 142.7 12/11/2013   LDLCALC 113 (H) 12/31/2018   ALT 12 12/31/2018   AST 17 12/31/2018   NA 136 12/31/2018   K 3.7 12/31/2018   CL 99 12/31/2018   CREATININE 0.79 12/31/2018   BUN 13 12/31/2018   CO2 31 12/31/2018   TSH 1.25 01/21/2018   HGBA1C 6.0 12/31/2018   MICROALBUR 0.2 12/11/2013      Assessment & Plan:   Problem List Items Addressed This Visit    Anemia    Follow cbc.        Relevant Medications   cyanocobalamin ((VITAMIN B-12)) injection 1,000 mcg (Completed)   B12 deficiency    Continue B12 injections.       Relevant Medications   cyanocobalamin ((VITAMIN B-12)) injection 1,000 mcg (Completed)   Frequent urinary tract infections    Has had issues with infections recently.  With bad odor now.  Hold on abx.  Check urine to see if culture present.  She denies any vaginal symptoms.  Discussed referral to urology.  She is in agreement.        Relevant Orders   Ambulatory referral to Urology   Health care maintenance    Physical today 01/03/19.  Mammogram 04/11/18 - Birads I.  Colonoscopy 09/04/11.  Recommended f/u in 10 years.        Hypercholesterolemia    On crestor.  Low cholesterol diet  and exercise.  Follow lipid panel and liver function tests.        Hyperglycemia    Low carb diet and exercise.  Follow met b and a1c.        Hypertension    Blood pressure has been under good control.  Have her spot check her pressure.  Get her back in soon to reassess.  Follow pressures.  Follow metabolic panel.        Other Visit Diagnoses    Routine general medical examination at a health care facility    -  Primary   Bad odor of urine       Relevant Orders   Urinalysis, Routine w reflex microscopic (Completed)   Urine Culture (Completed)       Einar Pheasant, MD

## 2019-01-04 LAB — URINE CULTURE
MICRO NUMBER:: 134580
RESULT: NO GROWTH
SPECIMEN QUALITY:: ADEQUATE

## 2019-01-05 ENCOUNTER — Encounter: Payer: Self-pay | Admitting: Internal Medicine

## 2019-01-05 DIAGNOSIS — N390 Urinary tract infection, site not specified: Secondary | ICD-10-CM | POA: Insufficient documentation

## 2019-01-05 NOTE — Assessment & Plan Note (Signed)
Blood pressure has been under good control.  Have her spot check her pressure.  Get her back in soon to reassess.  Follow pressures.  Follow metabolic panel.

## 2019-01-05 NOTE — Assessment & Plan Note (Signed)
Follow cbc.  

## 2019-01-05 NOTE — Assessment & Plan Note (Signed)
On crestor.  Low cholesterol diet and exercise.  Follow lipid panel and liver function tests.   

## 2019-01-05 NOTE — Assessment & Plan Note (Signed)
Low carb diet and exercise.  Follow met b and a1c.   

## 2019-01-05 NOTE — Assessment & Plan Note (Signed)
Has had issues with infections recently.  With bad odor now.  Hold on abx.  Check urine to see if culture present.  She denies any vaginal symptoms.  Discussed referral to urology.  She is in agreement.

## 2019-01-05 NOTE — Assessment & Plan Note (Signed)
Continue B12 injections.   

## 2019-01-22 ENCOUNTER — Ambulatory Visit: Payer: Medicare Other

## 2019-02-11 ENCOUNTER — Encounter: Payer: Self-pay | Admitting: Urology

## 2019-02-11 ENCOUNTER — Ambulatory Visit: Payer: Medicare Other | Admitting: Urology

## 2019-02-11 VITALS — BP 137/83 | HR 73 | Ht 63.0 in | Wt 182.0 lb

## 2019-02-11 DIAGNOSIS — N941 Unspecified dyspareunia: Secondary | ICD-10-CM | POA: Diagnosis not present

## 2019-02-11 DIAGNOSIS — N952 Postmenopausal atrophic vaginitis: Secondary | ICD-10-CM | POA: Diagnosis not present

## 2019-02-11 DIAGNOSIS — N390 Urinary tract infection, site not specified: Secondary | ICD-10-CM

## 2019-02-11 LAB — BLADDER SCAN AMB NON-IMAGING

## 2019-02-11 MED ORDER — ESTROGENS, CONJUGATED 0.625 MG/GM VA CREA: 1.0000 | TOPICAL_CREAM | Freq: Every day | VAGINAL | 12 refills | Status: DC

## 2019-02-11 NOTE — Progress Notes (Signed)
02/11/2019 5:58 PM   Lisa Robles 1946/02/16 [id number]  Referring provider: Dale Charlack, MD [address] [place], [place] [address]  Chief Complaint  Patient presents with  . Recurrent UTI    New Patient    HPI: Lisa Robles is a 73 yo F who presents today for the evaluation and management of frequent rUTIs. She was referred to [place] by Dale Ambridge, MD.   She had a positive urine culture E. Coli and was symptomatic in December, 2019.  On her visit with Dr. Westley Hummer, she reported of having a bad odor. She was held on abx. UA at time of visit was positive for few bacteria, otherwise unremarkable and urine culture was negative.  She was also seen and evaluated for foul-smelling urine in 09/2018 with a negative urine culture.  She is sexually active and reports of intercourse being painful. She reports of vaginal dryness.   She reports that symptoms with infection include burning with urination, pain and odor when she has an infection.  She denies any febrile UTIs.  No admissions for infections.  She reports that her symptoms are exacerbated by sexual activity as it seems to be a trigger.  She also reports that she often gets urinary tract infections when traveling.  She is worried about using vaginal estrogen cream due to increased risk of cancer.   She drinks a lot of water.  She has been taking a daily probiotic.  She does not use topical estrogen or cranberry tablets.  She does use Pyridium as needed which seems to help her symptoms.  Patient reports of gross hematuria with infection only. Denies dysuria, flank pain, or kidney stones.   She has no recent upper tract imaging. Last cross section imaging in 2009 w/o GU pathology.   She denies any personal history of cancer.   PMH: Past Medical History:  Diagnosis Date  . GERD (gastroesophageal reflux disease)   . Hypercholesteremia   . Hypertension     Surgical History: Past Surgical  History:  Procedure Laterality Date  . BREAST BIOPSY    . TOOTH EXTRACTION     tooth implantation  . TUBAL LIGATION  1979    Home Medications:  Allergies as of 02/11/2019      Reactions   Cinnamon Other (See Comments)   Mouth irritant   Bactrim [sulfamethoxazole-trimethoprim] Rash   Shellfish Allergy Rash      Medication List       Accurate as of February 11, 2019  5:58 PM. Always use your most recent med list.        albuterol 108 (90 Base) MCG/ACT inhaler Commonly known as:  PROVENTIL HFA;VENTOLIN HFA Inhale 2 puffs into the lungs every 6 (six) hours as needed.   aspirin 81 MG tablet Take 81 mg by mouth daily.   conjugated estrogens vaginal cream Commonly known as:  Premarin Place 1 Applicatorful vaginally daily. Use pea sized amount M-W-Fr before bedtime   fluticasone 50 MCG/ACT nasal spray Commonly known as:  FLONASE Place 2 sprays into both nostrils daily.   Fluticasone-Salmeterol 100-50 MCG/DOSE Aepb Commonly known as:  Advair Diskus inhale 1 dose by mouth twice a day   guaiFENesin 600 MG 12 hr tablet Commonly known as:  MUCINEX Take by mouth 2 (two) times daily.   hydrochlorothiazide 25 MG tablet Commonly known as:  HYDRODIURIL TAKE 1 TABLET(25 MG) BY MOUTH DAILY   loratadine 10 MG tablet Commonly known as:  CLARITIN Take 10 mg by  mouth daily.   magic mouthwash Soln Take 5 mLs by mouth 3 (three) times daily as needed for mouth pain.   meclizine 25 MG tablet Commonly known as:  ANTIVERT Take 1 tablet (25 mg total) by mouth 3 (three) times daily as needed for dizziness.   rosuvastatin 10 MG tablet Commonly known as:  Crestor Take 1 tablet (10 mg total) by mouth daily.   scopolamine 1 MG/3DAYS Commonly known as:  Transderm-Scop (1.5 MG) Place 1 patch (1.5 mg total) onto the skin every 3 (three) days.       Allergies:  Allergies  Allergen Reactions  . Cinnamon Other (See Comments)    Mouth irritant   . Bactrim  [Sulfamethoxazole-Trimethoprim] Rash  . Shellfish Allergy Rash    Family History: Family History  Problem Relation Age of Onset  . Heart attack Mother   . Heart attack Father   . Hypercholesterolemia Brother   . Cancer Maternal Aunt        breast  . Colon cancer Neg Hx   . Breast cancer Neg Hx     Social History:  reports that she quit smoking about 41 years ago. She has never used smokeless tobacco. She reports that she does not drink alcohol or use drugs.  ROS: UROLOGY Frequent Urination?: No Hard to postpone urination?: No Burning/pain with urination?: No Get up at night to urinate?: No Leakage of urine?: No Urine stream starts and stops?: No Trouble starting stream?: No Do you have to strain to urinate?: No Blood in urine?: No Urinary tract infection?: Yes Sexually transmitted disease?: No Injury to kidneys or bladder?: No Painful intercourse?: No Weak stream?: No Currently pregnant?: No Vaginal bleeding?: No Last menstrual period?: n  Gastrointestinal Nausea?: No Vomiting?: No Indigestion/heartburn?: No Diarrhea?: No Constipation?: No  Constitutional Fever: No Night sweats?: No Weight loss?: No Fatigue?: No  Skin Skin rash/lesions?: No Itching?: No  Eyes Blurred vision?: No Double vision?: No  Ears/Nose/Throat Sore throat?: No Sinus problems?: Yes  Hematologic/Lymphatic Swollen glands?: No Easy bruising?: No  Cardiovascular Leg swelling?: No Chest pain?: No  Respiratory Cough?: No Shortness of breath?: No  Endocrine Excessive thirst?: No  Musculoskeletal Back pain?: No Joint pain?: No  Neurological Headaches?: No Dizziness?: No  Psychologic Depression?: No Anxiety?: No  Physical Exam: BP 137/83   Pulse 73   Ht 5\' 3"  (1.6 m)   Wt 182 lb (82.6 kg)   BMI 32.24 kg/m   Constitutional:  Alert and oriented, No acute distress. HEENT: [place] AT, moist mucus membranes.  Trachea midline, no masses. Cardiovascular: No clubbing,  cyanosis, or edema. Respiratory: Normal respiratory effort, no increased work of breathing. GU/pelvic, chaperoned by CMA: No CVA tenderness atrophic external vaginal, vaginal support with/o vaginal prolapse, no stress urinary incontinence with Valsalva Skin: No rashes, bruises or suspicious lesions. Neurologic: Grossly intact, no focal deficits, moving all 4 extremities. Psychiatric: Normal mood and affect.  Laboratory Data: UA reviewed, see epic (currently asymptomatic)  Pertinent Imaging: Results for orders placed or performed in visit on 02/11/19  BLADDER SCAN AMB NON-IMAGING  Result Value Ref Range   Scan Result 5ml     Assessment & Plan:    1. rUTIs At this point time, she only has 1 documented infection over the past 12 months however I suspect that she has had additional true infections which were treated without UA/urine culture or while out of town based on her history Explained the benefits of Topical estrogen cream; to be applied MWF and  explained it is exceptionally safe regarding risk of cancer; advised her to abstain from sexual intercourse after placement due to risk of exposure to partner  PVR 12 mL, voiding well  UA was negative   Encouraged use of Cranberry tablets twice a day  Encouraged to drink lots of water Return when infection is present for more effective abx treamtent.  If she develops more documented infections, will consider upper tract imaging and cystoscopy for further evaluation of anatomic issues  2.  Atrophic vaginitis/dyspareunia Topical estrogen as above for both recurrent UTIs as well as for dyspareunia  Return if symptoms worsen or fail to improve.  Vanna Scotland, MD  [facility] [address] [place], [place] [address] [phone]  I, Donne Hazel, am acting as a scribe for Dr. Vanna Scotland,  I have reviewed the above documentation for accuracy and completeness, and I agree with the  above.   Vanna Scotland, MD

## 2019-02-12 LAB — URINALYSIS, COMPLETE
BILIRUBIN UA: NEGATIVE
Glucose, UA: NEGATIVE
KETONES UA: NEGATIVE
Leukocytes, UA: NEGATIVE
Nitrite, UA: NEGATIVE
Protein, UA: NEGATIVE
RBC, UA: NEGATIVE
Specific Gravity, UA: 1.025 (ref 1.005–1.030)
UUROB: 0.2 mg/dL (ref 0.2–1.0)
pH, UA: 7 (ref 5.0–7.5)

## 2019-03-05 ENCOUNTER — Ambulatory Visit (INDEPENDENT_AMBULATORY_CARE_PROVIDER_SITE_OTHER): Payer: Medicare Other

## 2019-03-05 ENCOUNTER — Other Ambulatory Visit: Payer: Self-pay

## 2019-03-05 DIAGNOSIS — Z0000 Encounter for general adult medical examination without abnormal findings: Secondary | ICD-10-CM | POA: Diagnosis not present

## 2019-03-05 NOTE — Patient Instructions (Addendum)
  Lisa Robles , Thank you for taking time to come for your Medicare Wellness Visit. I appreciate your ongoing commitment to your health goals. Please review the following plan we discussed and let me know if I can assist you in the future.   Follow up with your doctor as needed.    Bring a copy of your Health Care Power of Attorney and/or Living Will to be scanned into chart upon completion.  Have a great day!  These are the goals we discussed: Goals    . Take cholesterol medication as prescribed within the year .       This is a list of the screening recommended for you and due dates:  Health Maintenance  Topic Date Due  . Flu Shot  07/05/2019  . Mammogram  04/11/2020  . Tetanus Vaccine  05/18/2021  . Colon Cancer Screening  09/03/2021  . DEXA scan (bone density measurement)  Completed  .  Hepatitis C: One time screening is recommended by Center for Disease Control  (CDC) for  adults born from [birth year] through 1967.   Completed  . Pneumonia vaccines  Completed

## 2019-03-05 NOTE — Progress Notes (Signed)
Subjective:   Lisa Robles is a 73 y.o. female who presents for Medicare Annual (Subsequent) preventive examination.  Review of Systems:  No ROS.  Medicare Wellness Visit. Additional risk factors are reflected in the social history. Cardiac Risk Factors include: advanced age (>15men, >84 women);hypertension     Objective:     Vitals: There were no vitals taken for this visit.  There is no height or weight on file to calculate BMI.   Advanced Directives 03/05/2019 12/02/2018 01/21/2018 01/18/2017 06/30/2016 01/19/2016  Does Patient Have a Medical Advance Directive? No No No No No No  Would patient like information on creating a medical advance directive? Yes (MAU/Ambulatory/Procedural Areas - Information given) - Yes (MAU/Ambulatory/Procedural Areas - Information given) No - Patient declined - Yes - Educational materials given    Tobacco Social History   Tobacco Use  Smoking Status Former Smoker  . Last attempt to quit: 12/04/1977  . Years since quitting: 41.2  Smokeless Tobacco Never Used     Counseling given: Not Answered   Clinical Intake:  Pre-visit preparation completed: Yes        Diabetes: No  How often do you need to have someone help you when you read instructions, pamphlets, or other written materials from your doctor or pharmacy?: 1 - Never  Interpreter Needed?: No     Past Medical History:  Diagnosis Date  . GERD (gastroesophageal reflux disease)   . Hypercholesteremia   . Hypertension    Past Surgical History:  Procedure Laterality Date  . BREAST BIOPSY    . TOOTH EXTRACTION     tooth implantation  . TUBAL LIGATION  1979   Family History  Problem Relation Age of Onset  . Heart attack Mother   . Heart attack Father   . Hypercholesterolemia Brother   . Cancer Maternal Aunt        breast  . Colon cancer Neg Hx   . Breast cancer Neg Hx    Social History   Socioeconomic History  . Marital status: Widowed    Spouse name: Not on file   . Number of children: 2  . Years of education: masters  . Highest education level: Not on file  Occupational History  . Occupation: retired Data processing manager  . Financial resource strain: Not hard at all  . Food insecurity:    Worry: Never true    Inability: Never true  . Transportation needs:    Medical: No    Non-medical: No  Tobacco Use  . Smoking status: Former Smoker    Last attempt to quit: 12/04/1977    Years since quitting: 41.2  . Smokeless tobacco: Never Used  Substance and Sexual Activity  . Alcohol use: No    Alcohol/week: 0.0 standard drinks  . Drug use: No  . Sexual activity: Yes    Comment: Is sexually active wih same partner x 14 years. Recently found out partner has been cheating.  Lifestyle  . Physical activity:    Days per week: 0 days    Minutes per session: Not on file  . Stress: Not at all  Relationships  . Social connections:    Talks on phone: Not on file    Gets together: Not on file    Attends religious service: Not on file    Active member of club or organization: Not on file    Attends meetings of clubs or organizations: Not on file    Relationship status: Not on file  Other Topics Concern  . Not on file  Social History Narrative  . Not on file    Outpatient Encounter Medications as of 03/05/2019  Medication Sig  . albuterol (PROVENTIL HFA;VENTOLIN HFA) 108 (90 Base) MCG/ACT inhaler Inhale 2 puffs into the lungs every 6 (six) hours as needed.  Marland Kitchen aspirin 81 MG tablet Take 81 mg by mouth daily.  Marland Kitchen conjugated estrogens (PREMARIN) vaginal cream Place 1 Applicatorful vaginally daily. Use pea sized amount M-W-Fr before bedtime  . fluticasone (FLONASE) 50 MCG/ACT nasal spray Place 2 sprays into both nostrils daily.  . Fluticasone-Salmeterol (ADVAIR DISKUS) 100-50 MCG/DOSE AEPB inhale 1 dose by mouth twice a day  . guaiFENesin (MUCINEX) 600 MG 12 hr tablet Take by mouth 2 (two) times daily.  . hydrochlorothiazide (HYDRODIURIL) 25 MG tablet TAKE  1 TABLET(25 MG) BY MOUTH DAILY  . loratadine (CLARITIN) 10 MG tablet Take 10 mg by mouth daily.  . meclizine (ANTIVERT) 25 MG tablet Take 1 tablet (25 mg total) by mouth 3 (three) times daily as needed for dizziness.  . rosuvastatin (CRESTOR) 10 MG tablet Take 1 tablet (10 mg total) by mouth daily.  . [DISCONTINUED] magic mouthwash SOLN Take 5 mLs by mouth 3 (three) times daily as needed for mouth pain. (Patient not taking: Reported on 03/05/2019)  . [DISCONTINUED] scopolamine (TRANSDERM-SCOP, 1.5 MG,) 1 MG/3DAYS Place 1 patch (1.5 mg total) onto the skin every 3 (three) days. (Patient not taking: Reported on 03/05/2019)   No facility-administered encounter medications on file as of 03/05/2019.     Activities of Daily Living In your present state of health, do you have any difficulty performing the following activities: 03/05/2019  Hearing? N  Vision? N  Difficulty concentrating or making decisions? N  Walking or climbing stairs? N  Dressing or bathing? N  Doing errands, shopping? N  Preparing Food and eating ? N  Using the Toilet? N  In the past six months, have you accidently leaked urine? N  Do you have problems with loss of bowel control? N  Managing your Medications? N  Managing your Finances? N  Housekeeping or managing your Housekeeping? N  Some recent data might be hidden    Patient Care Team: Dale [place], MD as PCP - General (Internal Medicine)    Assessment:   This is a routine wellness examination for Lisa Robles.  I connected with Lisa Robles on 03/05/19 at  1:30 PM EDT by a video enabled telemedicine application and verified that I am speaking with the correct person using two identifiers. She lives alone and is taking this virtual call from her home while I am at work.  Appointment scheduled 1:30. Per patient preference, appointment start time 1:00, end time 1:30.  Per patient request, she took her temperature, 97.2. All other vital signs deferred.   Monitoring her  blood pressure and reports wnl.  Medications- magic mouthwash and transderm patch discontinued per patient preference. Notes she has not been taking crestor regularly, but now plans to take as directed by her doctor.   COVID-19 precautions in place. Drives with windows rolled closed, walks alone for 1 mile for exercise, receives groceries at her door with social distancing.   Health Screenings  Mammogram - 04/11/18. Received a reminder and plans to schedule.  Colonoscopy 09/04/11 Bone Density -05/12/15 Glaucoma -none Hearing -demonstrates normal hearing during conversation. Hemoglobin A1C - 12/31/18 (6.0) Cholesterol- 12/31/18 (191) Dental- every 6 months Vision- annual visits  Social  Alcohol intake - no Smoking history- former Smokers  in home? none Illicit drug use? none Exercise -walking daily, for 1 mile Diet -monitoring, regular Sexually Active -yes Multiple partners- no  Safety  Patient feels safe at home.  Patient does have smoke detectors at home  Patient does wear sunscreen or protective clothing when in direct sunlight  Patient does wear seat belt when driving or riding with others.   Activities of Daily Living Patient can do their own household chores. Denies needing assistance with: driving, feeding themselves, getting from bed to chair, getting to the toilet, bathing/showering, dressing, managing money, climbing flight of stairs, or preparing meals.   Depression Screen Patient denies losing interest in daily life, feeling hopeless, or crying easily over simple problems.   Fall Screen One fall since December 2019.  Followed by her doctor.  She has not needed to take meclizine and denies dizziness.  Memory Screen Patient denies problems with memory, misplacing items, and is able to balance checkbook/bank accounts.  Patient is alert, normal appearance, oriented to person/place/and time. Correctly identified the president of the [place], recall of 3/3 objects, and  performing simple calculations.  Patient displays appropriate judgement and can read correct time from watch face.   Immunizations The following Immunizations are up to date: Influenza, shingles, pneumonia, and tetanus.   Other Providers Patient Care Team: Dale Bancroft, MD as PCP - General (Internal Medicine)  Exercise Activities and Dietary recommendations Current Exercise Habits: Home exercise routine, Type of exercise: walking, Time (Minutes): 30, Frequency (Times/Week): 7, Weekly Exercise (Minutes/Week): 210, Intensity: Mild  Goals    . Take cholesterol medication as prescribed within the year .       Fall Risk Fall Risk  03/05/2019 04/12/2018 01/21/2018 01/18/2017 06/30/2016  Falls in the past year? 1 No No No No  Number falls in past yr: 0 - - - -  Comment Last 11/2018 - - - -   Depression Screen PHQ 2/9 Scores 03/05/2019 04/12/2018 01/21/2018 07/06/2017  PHQ - 2 Score 0 0 0 0  PHQ- 9 Score - - - 0     Cognitive Function MMSE - Mini Mental State Exam 01/18/2017 01/19/2016  Orientation to time 5 5  Orientation to Place 5 5  Registration 3 3  Attention/ Calculation 5 5  Recall 3 3  Language- name 2 objects 2 2  Language- repeat 1 1  Language- follow 3 step command 3 3  Language- read & follow direction 1 1  Write a sentence 1 1  Copy design 1 1  Total score 30 30     6CIT Screen 03/05/2019 01/21/2018  What Year? 0 points 0 points  What month? 0 points 0 points  What time? 0 points 0 points  Count back from 20 0 points 0 points  Months in reverse 0 points 0 points  Repeat phrase 0 points 0 points  Total Score 0 0    Immunization History  Administered Date(s) Administered  . Influenza, High Dose Seasonal PF 08/17/2016, 08/22/2017, 08/28/2018  . Influenza,inj,Quad PF,6+ Mos 07/29/2014  . Influenza-Unspecified 09/01/2013, 09/09/2015  . Pneumococcal Conjugate-13 12/01/2015  . Pneumococcal Polysaccharide-23 07/18/2011, 06/28/2017  . Tdap 05/19/2011  . Zoster Recombinat  (Shingrix) 06/28/2017   Screening Tests Health Maintenance  Topic Date Due  . INFLUENZA VACCINE  07/05/2019  . MAMMOGRAM  04/11/2020  . TETANUS/TDAP  05/18/2021  . COLONOSCOPY  09/03/2021  . DEXA SCAN  Completed  . Hepatitis C Screening  Completed  . PNA vac Low Risk Adult  Completed  Plan:    End of life planning; Advance aging; Advanced directives discussed. Copy of current HCPOA/Living Will requested upon completion.    I have personally reviewed and noted the following in the patient's chart:   . Medical and social history . Use of alcohol, tobacco or illicit drugs  . Current medications and supplements . Functional ability and status . Nutritional status . Physical activity . Advanced directives . List of other physicians . Hospitalizations, surgeries, and ER visits in previous 12 months . Vitals . Screenings to include cognitive, depression, and falls . Referrals and appointments  In addition, I have reviewed and discussed with patient certain preventive protocols, quality metrics, and best practice recommendations. A written personalized care plan for preventive services as well as general preventive health recommendations were provided to patient.     Ashok Pall, LPN  12/09/1094   Reviewed above information.  Agree with assessment and plan.    Dr Lorin Picket

## 2019-04-08 ENCOUNTER — Ambulatory Visit (INDEPENDENT_AMBULATORY_CARE_PROVIDER_SITE_OTHER): Payer: Medicare Other | Admitting: Family Medicine

## 2019-04-08 ENCOUNTER — Ambulatory Visit: Payer: Self-pay | Admitting: Internal Medicine

## 2019-04-08 ENCOUNTER — Encounter: Payer: Self-pay | Admitting: Family Medicine

## 2019-04-08 VITALS — BP 125/82 | Temp 97.6°F

## 2019-04-08 DIAGNOSIS — J208 Acute bronchitis due to other specified organisms: Secondary | ICD-10-CM | POA: Diagnosis not present

## 2019-04-08 DIAGNOSIS — J302 Other seasonal allergic rhinitis: Secondary | ICD-10-CM | POA: Diagnosis not present

## 2019-04-08 MED ORDER — ALBUTEROL SULFATE HFA 108 (90 BASE) MCG/ACT IN AERS: 2.0000 | INHALATION_SPRAY | Freq: Four times a day (QID) | RESPIRATORY_TRACT | 2 refills | Status: DC | PRN

## 2019-04-08 MED ORDER — FLUTICASONE-SALMETEROL 100-50 MCG/DOSE IN AEPB: INHALATION_SPRAY | RESPIRATORY_TRACT | 2 refills | Status: DC

## 2019-04-08 MED ORDER — GUAIFENESIN ER 600 MG PO TB12: 1200.0000 mg | ORAL_TABLET | Freq: Two times a day (BID) | ORAL | 0 refills | Status: AC | PRN

## 2019-04-08 MED ORDER — METHYLPREDNISOLONE 4 MG PO TBPK: ORAL_TABLET | ORAL | 0 refills | Status: DC

## 2019-04-08 NOTE — Telephone Encounter (Signed)
  Pt called in c/o a cough since March 4th that has not gotten better with using Mucinex.   She is also c/o pain in her chest and a tightness especially at night with this chest congestion and cough.  I warm transferred her call to [facility], CMA for Dr. Lorin Picket to schedule a virtual visit with a provider.  (COVID-19 pandemic going on).  I sent these notes to the office.  Reason for Disposition . [1] Continuous (nonstop) coughing interferes with work or school AND [2] no improvement using cough treatment per protocol  Answer Assessment - Initial Assessment Questions 1. ONSET: "When did the cough begin?"      I've had this cough since March 4th.  It was a dry cough.  I was using Mucinex.   During last 14 days tightness in my chest and no fever.  I was using my inhaler once a day.  2. SEVERITY: "How bad is the cough today?"      This morning I've spit up clear mucus.   I have this every year.   This time my chest really hurts.   My chest hurts before I cough. 3. RESPIRATORY DISTRESS: "Describe your breathing."      Yes.   I'm using my inhaler to help me breath better.   I also have Advair but I'm not using it. 4. FEVER: "Do you have a fever?" If so, ask: "What is your temperature, how was it measured, and when did it start?"     No fever.   I'm checking it and it's normal. 5. HEMOPTYSIS: "Are you coughing up any blood?" If so ask: "How much?" (flecks, streaks, tablespoons, etc.)     No blood 6. TREATMENT: "What have you done so far to treat the cough?" (e.g., meds, fluids, humidifier)     Used Mucinex and it's not better. 7. CARDIAC HISTORY: "Do you have any history of heart disease?" (e.g., heart attack, congestive heart failure)      No 8. LUNG HISTORY: "Do you have any history of lung disease?"  (e.g., pulmonary embolus, asthma, emphysema)     I have asthma.   I haven't used an inhaler I years.   9. PE RISK FACTORS: "Do you have a history of blood clots?" (or: recent major surgery, recent  prolonged travel, bedridden)     No blood clots in lungs 10. OTHER SYMPTOMS: "Do you have any other symptoms? (e.g., runny nose, wheezing, chest pain)       No just the chest pain.    11. PREGNANCY: "Is there any chance you are pregnant?" "When was your last menstrual period?"       Not asked 12. TRAVEL: "Have you traveled out of the country in the last month?" (e.g., travel history, exposures)       No.   I've been isolated and not been exposed to anyone.   (COVID-19 pandemic)  Protocols used: COUGH - ACUTE NON-PRODUCTIVE-A-AH

## 2019-04-08 NOTE — Progress Notes (Signed)
Patient ID: Lisa MillerGracetta W Robles, female   DOB: 11/25/1946, 73 y.o.   MRN: [id number]    Virtual Visit via video Note  This visit type was conducted due to national recommendations for restrictions regarding the COVID-19 pandemic (e.g. social distancing).  This format is felt to be most appropriate for this patient at this time.  All issues noted in this document were discussed and addressed.  No physical exam was performed (except for noted visual exam findings with Video Visits).   I connected with [name] today at 10:20 AM EDT by a video enabled telemedicine application or telephone and verified that I am speaking with the correct person using two identifiers. Location patient: home Location provider: [facility].  Persons participating in the virtual visit: patient, provider  I discussed the limitations, risks, security and privacy concerns of performing an evaluation and management service by telephone and the availability of in person appointments. I also discussed with the patient that there may be a patient responsible charge related to this service. The patient expressed understanding and agreed to proceed.  Interactive audio and video telecommunications were attempted between this provider and patient, however failed, due to patient having technical difficulties. Finished visit via phone call.   HPI:  Patient and I connected via telephone today due to complaint of cough and chest congestion that has waxed and waned since March 4 of 2020.  Patient states she has used Mucinex and does have good effect from the medication, does seem to calm cough down and thin secretions.  Will be feeling better for a few days maybe a week, then cough and congestion return.  Patient states she has not left her home since mid March 2020 when all of the COVID-19 pandemic recommendations or restrictions began.  States her children have been doing her grocery shopping and dropping off the groceries on her  porch, states the only place she will go is sitting on her front deck or she may go for a drive alone in her car just to see his new scenery but otherwise does not go out and interact with other people.  She also has prescriptions for Advair twice daily and albuterol as needed.  Patient does not take the Advair regularly states she only uses as needed.  Patient states she also has been using the albuterol inhaler every day for the past week or so due to cough and chest congestion.   Denies fever or chills.  Denies palpitations, does have some chest pain off and on but has had this for many weeks and it usually occurs after a coughing spell.  Denies lower extremity swelling.  Denies GI or GU issues.  ROS: See pertinent positives and negatives per HPI.  Past Medical History:  Diagnosis Date  . GERD (gastroesophageal reflux disease)   . Hypercholesteremia   . Hypertension     Past Surgical History:  Procedure Laterality Date  . BREAST BIOPSY    . TOOTH EXTRACTION     tooth implantation  . TUBAL LIGATION  1979    Family History  Problem Relation Age of Onset  . Heart attack Mother   . Heart attack Father   . Hypercholesterolemia Brother   . Cancer Maternal Aunt        breast  . Colon cancer Neg Hx   . Breast cancer Neg Hx    Social History   Tobacco Use  . Smoking status: Former Smoker    Last attempt to quit: 12/04/1977  Years since quitting: 41.3  . Smokeless tobacco: Never Used  Substance Use Topics  . Alcohol use: No    Alcohol/week: 0.0 standard drinks    Current Outpatient Medications:  .  albuterol (PROVENTIL HFA;VENTOLIN HFA) 108 (90 Base) MCG/ACT inhaler, Inhale 2 puffs into the lungs every 6 (six) hours as needed., Disp: 6.7 g, Rfl: 2 .  aspirin 81 MG tablet, Take 81 mg by mouth daily., Disp: , Rfl:  .  conjugated estrogens (PREMARIN) vaginal cream, Place 1 Applicatorful vaginally daily. Use pea sized amount M-W-Fr before bedtime, Disp: 42.5 g, Rfl: 12 .   fluticasone (FLONASE) 50 MCG/ACT nasal spray, Place 2 sprays into both nostrils daily., Disp: 16 g, Rfl: 1 .  Fluticasone-Salmeterol (ADVAIR DISKUS) 100-50 MCG/DOSE AEPB, inhale 1 dose by mouth twice a day, Disp: 60 each, Rfl: 2 .  guaiFENesin (MUCINEX) 600 MG 12 hr tablet, Take by mouth 2 (two) times daily., Disp: , Rfl:  .  hydrochlorothiazide (HYDRODIURIL) 25 MG tablet, TAKE 1 TABLET(25 MG) BY MOUTH DAILY, Disp: 90 tablet, Rfl: 1 .  loratadine (CLARITIN) 10 MG tablet, Take 10 mg by mouth daily., Disp: , Rfl:  .  meclizine (ANTIVERT) 25 MG tablet, Take 1 tablet (25 mg total) by mouth 3 (three) times daily as needed for dizziness., Disp: 30 tablet, Rfl: 0 .  rosuvastatin (CRESTOR) 10 MG tablet, Take 1 tablet (10 mg total) by mouth daily., Disp: 90 tablet, Rfl: 0  EXAM:  Vitals:   04/08/19 1020  BP: 125/82  Temp: 97.6 F (36.4 C)   GENERAL: alert, oriented, appears well and in no acute distress  LUNGS: Speaking in full sentences. Breathing rate appears normal, no gasping or wheezing. No cough while we are on phone.   PSYCH/NEURO: pleasant and cooperative, no obvious depression or anxiety, speech and thought processing grossly intact  ASSESSMENT AND PLAN:  Discussed the following assessment and plan:  Viral bronchitis - Plan: methylPREDNISolone (MEDROL DOSEPAK) 4 MG TBPK tablet, albuterol (VENTOLIN HFA) 108 (90 Base) MCG/ACT inhaler, Fluticasone-Salmeterol (ADVAIR DISKUS) 100-50 MCG/DOSE AEPB, guaiFENesin (MUCINEX) 600 MG 12 hr tablet  Seasonal allergies   Patient symptoms do sound like a viral bronchitis flareup that could be being exacerbated by seasonal allergy symptoms.  Patient's cough symptoms being around over 2 months waxing and waning do not make me think that it is bacterial infection.  She is not someone who is high risk to have gotten COVID-19 due to her self quarantining at home and having all of her groceries delivered.  Patient advised to resume taking Advair twice daily  every day and also use albuterol as needed.  She will take low-dose steroid taper and use Mucinex tablets to help calm cough and thin phlegm.  Patient advised that if her symptoms worsen rather than improve with this treatment plan to call us right away and let us know.   I discussed the assessment and treatment plan with the patient. The patient was provided an opportunity to ask questions and all were answered. The patient agreed with the plan and demonstrated an understanding of the instructions.   The patient was advised to call back or seek an in-person evaluation if the symptoms worsen or if the condition fails to improve as anticipated.  I provided 15 minutes of non-face-to-face time during this encounter.   Tracey Harries, FNP

## 2019-04-29 ENCOUNTER — Encounter: Payer: Self-pay | Admitting: Internal Medicine

## 2019-04-29 ENCOUNTER — Ambulatory Visit (INDEPENDENT_AMBULATORY_CARE_PROVIDER_SITE_OTHER): Payer: Medicare Other | Admitting: Internal Medicine

## 2019-04-29 ENCOUNTER — Telehealth: Payer: Self-pay | Admitting: Hematology

## 2019-04-29 ENCOUNTER — Other Ambulatory Visit: Payer: Medicare Other

## 2019-04-29 ENCOUNTER — Ambulatory Visit: Payer: Self-pay | Admitting: *Deleted

## 2019-04-29 ENCOUNTER — Other Ambulatory Visit: Payer: Self-pay

## 2019-04-29 DIAGNOSIS — R079 Chest pain, unspecified: Secondary | ICD-10-CM

## 2019-04-29 DIAGNOSIS — R059 Cough, unspecified: Secondary | ICD-10-CM

## 2019-04-29 DIAGNOSIS — R05 Cough: Secondary | ICD-10-CM

## 2019-04-29 DIAGNOSIS — J4520 Mild intermittent asthma, uncomplicated: Secondary | ICD-10-CM

## 2019-04-29 DIAGNOSIS — K219 Gastro-esophageal reflux disease without esophagitis: Secondary | ICD-10-CM | POA: Diagnosis not present

## 2019-04-29 DIAGNOSIS — Z20822 Contact with and (suspected) exposure to covid-19: Secondary | ICD-10-CM

## 2019-04-29 DIAGNOSIS — J45909 Unspecified asthma, uncomplicated: Secondary | ICD-10-CM | POA: Insufficient documentation

## 2019-04-29 MED ORDER — BENZONATATE 100 MG PO CAPS: 100.0000 mg | ORAL_CAPSULE | Freq: Three times a day (TID) | ORAL | 0 refills | Status: DC | PRN

## 2019-04-29 MED ORDER — FLUTICASONE PROPIONATE 50 MCG/ACT NA SUSP: 2.0000 | Freq: Every day | NASAL | 1 refills | Status: AC

## 2019-04-29 MED ORDER — PANTOPRAZOLE SODIUM 40 MG PO TBEC: 40.0000 mg | DELAYED_RELEASE_TABLET | Freq: Every day | ORAL | 2 refills | Status: DC

## 2019-04-29 NOTE — Assessment & Plan Note (Signed)
Reports has a mild history of asthma.  Increased advair to bid - scheduled.  Treat acid reflux and post nasal drainage as outlined.  Has albuterol inhaler if needed.  Hold on prednisone at this time.  Follow closely.

## 2019-04-29 NOTE — Progress Notes (Signed)
COVID testing scheduled for today at 2:30.  Orders placed.  Patient verbalizes understanding.

## 2019-04-29 NOTE — Telephone Encounter (Signed)
Pt has a scheduled appt for today.

## 2019-04-29 NOTE — Telephone Encounter (Signed)
Dr. Lorin Picket referred to patient for COVID testing.

## 2019-04-29 NOTE — Assessment & Plan Note (Signed)
Discussed with her regarding limitations of video exam/visit.  Unable to do EKG.  Discussed possible etiologies.  No pain or tightness with increased activity or exertion.  Does report some increased sob with exertion.  Pain occurs at night and is relieved by advair.  Improved in am, but coughing and expectorating mucus.  Increased advair to bid.  Use bid on a regular basis for now.  Treat acid reflux with protonix.  She requested to have tessalon perles if needed for cough.  Also nasacort to help with post nasal drainage.  Discussed COVID testing.  She request to be tested.  Discussed self quarantine.  Discussed further testing, for example ekg, etc.  Wants to start with above first.  If persistent or worsening problems, will need to be evaluated.

## 2019-04-29 NOTE — Assessment & Plan Note (Signed)
Cough and chest discomfort as outlined.  Treat acid reflux.  Also advair bid - scheduled.  nasacort to help with post nasal drainage.  COVID testing as discussed.  Discussed self quarantine.  She requested rx for tessalon perles to have if needed.  Follow closely.  Schedule f/u soon to reassess.

## 2019-04-29 NOTE — Telephone Encounter (Signed)
Pt had appt with L. Guse 04/08/2019 for "Viral bronchitis" States completed prednisone taper, using inhaler and Mucinex. States "Felt better for a while after that, now coughing up clear phlegm and my chest is tight again with congestion on the right side only."" Denies SOB, afebrile. Reports chest tightness worse in evening, mild cough in mornings. States has not been going out interacting with others, has not traveled. Pt is requesting an appt and Covid-19 testing. [place] called practice, spoke to Clydie Braun for consideration of appt.   Reason for Disposition . Cough has been present for > 3 weeks  Answer Assessment - Initial Assessment Questions 1. ONSET: "When did the cough begin?"      04/08/2019 2. SEVERITY: "How bad is the cough today?"      Mild, only in AM 3. RESPIRATORY DISTRESS: "Describe your breathing."      WNL 4. FEVER: "Do you have a fever?" If so, ask: "What is your temperature, how was it measured, and when did it start?"     no 5. HEMOPTYSIS: "Are you coughing up any blood?" If so ask: "How much?" (flecks, streaks, tablespoons, etc.)     no 6. TREATMENT: "What have you done so far to treat the cough?" (e.g., meds, fluids, humidifier)     Completed prednisone taper 7. CARDIAC HISTORY: "Do you have any history of heart disease?" (e.g., heart attack, congestive heart failure)       8. LUNG HISTORY: "Do you have any history of lung disease?"  (e.g., pulmonary embolus, asthma, emphysema)     [place] 9. PE RISK FACTORS: "Do you have a history of blood clots?" (or: recent major surgery, recent prolonged travel, bedridden)      10. OTHER SYMPTOMS: "Do you have any other symptoms? (e.g., runny nose, wheezing, chest pain)       "Congested in right side of chest, worse in evenings"  12. TRAVEL: "Have you traveled out of the country in the last month?" (e.g., travel history, exposures)       no  Protocols used: COUGH - ACUTE NON-PRODUCTIVE-A-AH

## 2019-04-29 NOTE — Assessment & Plan Note (Signed)
Concern over the possibility of acid reflux contributing to some of her symptoms.  Start protnox 40mg  q day.  Follow closely.  Schedule f/u soon to reassess.

## 2019-04-29 NOTE — Progress Notes (Signed)
Patient ID: [name], female   DOB: 09-Dec-1945, 73 y.o.   MRN: [id number]   Virtual Visit via video Note  This visit type was conducted due to national recommendations for restrictions regarding the COVID-19 pandemic (e.g. social distancing).  This format is felt to be most appropriate for this patient at this time.  All issues noted in this document were discussed and addressed.  No physical exam was performed (except for noted visual exam findings with Video Visits).   I connected with Lisa Robles by a video enabled telemedicine application and verified that I am speaking with the correct person using two identifiers. Location patient: home Location provider: work Persons participating in the virtual visit: patient, provider  I discussed the limitations, risks, security and privacy concerns of performing an evaluation and management service by video and the availability of in person appointments. The patient expressed understanding and agreed to proceed.   Reason for visit: acute visit  HPI: She reports that over the last month she has noticed her chest hurting, cough - productive of white mucus at times and some sob with exertion.  She traveled to [place] by train on March 8.  Since then has been trying to stay in due to COVID restrictions.  She saw Lisa Robles 04/08/19.  Was given medrol dose pack and instructed to use her advair regularly.  States she does feel better after prednisone.  Still with chest discomfort.  Still persistent cough.  Chest pain occurs in pm.  She uses her advair and pain improves/resolves.  Sleeping well.  Will have return of pain in am.  She cough and "gets congestion out" and feels better. She is walking one mile per day.  Feels more sob with exertion.  No chest pain or tightness with increased activity or exertion.  No fever.  Taking zyrtec and mucinex.  Using her advair once per day - in the evening.  No fever.  No vomiting.  Eating.  Discussed  possible acid reflux.  She reports does feel similar to acid reflux flare.  No abdominal pain or bowel change reported.     ROS: See pertinent positives and negatives per HPI.  Past Medical History:  Diagnosis Date   GERD (gastroesophageal reflux disease)    Hypercholesteremia    Hypertension     Past Surgical History:  Procedure Laterality Date   BREAST BIOPSY     TOOTH EXTRACTION     tooth implantation   TUBAL LIGATION  1979    Family History  Problem Relation Age of Onset   Heart attack Mother    Heart attack Father    Hypercholesterolemia Brother    Cancer Maternal Aunt        breast   Colon cancer Neg Hx    Breast cancer Neg Hx     SOCIAL HX: reviewed.    Current Outpatient Medications:    albuterol (VENTOLIN HFA) 108 (90 Base) MCG/ACT inhaler, Inhale 2 puffs into the lungs every 6 (six) hours as needed., Disp: 18 g, Rfl: 2   aspirin 81 MG tablet, Take 81 mg by mouth daily., Disp: , Rfl:    benzonatate (TESSALON) 100 MG capsule, Take 1 capsule (100 mg total) by mouth 3 (three) times daily as needed for cough., Disp: 21 capsule, Rfl: 0   conjugated estrogens (PREMARIN) vaginal cream, Place 1 Applicatorful vaginally daily. Use pea sized amount M-W-Fr before bedtime, Disp: 42.5 g, Rfl: 12   fluticasone (FLONASE) 50 MCG/ACT nasal spray, Place  2 sprays into both nostrils daily., Disp: 16 g, Rfl: 1   Fluticasone-Salmeterol (ADVAIR DISKUS) 100-50 MCG/DOSE AEPB, inhale 1 dose by mouth twice a day, Disp: 60 each, Rfl: 2   guaiFENesin (MUCINEX) 600 MG 12 hr tablet, Take 2 tablets (1,200 mg total) by mouth 2 (two) times daily as needed for cough or to loosen phlegm., Disp: 20 tablet, Rfl: 0   hydrochlorothiazide (HYDRODIURIL) 25 MG tablet, TAKE 1 TABLET(25 MG) BY MOUTH DAILY, Disp: 90 tablet, Rfl: 1   loratadine (CLARITIN) 10 MG tablet, Take 10 mg by mouth daily., Disp: , Rfl:    meclizine (ANTIVERT) 25 MG tablet, Take 1 tablet (25 mg total) by mouth 3  (three) times daily as needed for dizziness., Disp: 30 tablet, Rfl: 0   pantoprazole (PROTONIX) 40 MG tablet, Take 1 tablet (40 mg total) by mouth daily., Disp: 30 tablet, Rfl: 2   rosuvastatin (CRESTOR) 10 MG tablet, Take 1 tablet (10 mg total) by mouth daily., Disp: 90 tablet, Rfl: 0  EXAM:  GENERAL: alert, oriented, appears well and in no acute distress  HEENT: atraumatic, conjunttiva clear, no obvious abnormalities on inspection of external nose and ears  NECK: normal movements of the head and neck  LUNGS: on inspection no signs of respiratory distress, breathing rate appears normal, no obvious gross SOB, gasping or wheezing.  No wheezing with forced expiration.    CV: no obvious cyanosis  PSYCH/NEURO: pleasant and cooperative, no obvious depression or anxiety, speech and thought processing grossly intact  ASSESSMENT AND PLAN:  Discussed the following assessment and plan:  Gastroesophageal reflux disease, esophagitis presence not specified  Chest pain, unspecified type  Cough  Mild intermittent asthma without complication  GERD (gastroesophageal reflux disease) Concern over the possibility of acid reflux contributing to some of her symptoms.  Start protnox  q day.  Follow closely.  Schedule f/u soon to reassess.    Chest pain Discussed with her regarding limitations of video exam/visit.  Unable to do EKG.  Discussed possible etiologies.  No pain or tightness with increased activity or exertion.  Does report some increased sob with exertion.  Pain occurs at night and is relieved by advair.  Improved in am, but coughing and expectorating mucus.  Increased advair to bid.  Use bid on a regular basis for now.  Treat acid reflux with protonix.  She requested to have tessalon perles if needed for cough.  Also nasacort to help with post nasal drainage.  Discussed COVID testing.  She request to be tested.  Discussed self quarantine.  Discussed further testing, for example ekg, etc.   Wants to start with above first.  If persistent or worsening problems, will need to be evaluated.    Cough Cough and chest discomfort as outlined.  Treat acid reflux.  Also advair bid - scheduled.  nasacort to help with post nasal drainage.  COVID testing as discussed.  Discussed self quarantine.  She requested rx for tessalon perles to have if needed.  Follow closely.  Schedule f/u soon to reassess.    [employer] has a mild history of asthma.  Increased advair to bid - scheduled.  Treat acid reflux and post nasal drainage as outlined.  Has albuterol inhaler if needed.  Hold on prednisone at this time.  Follow closely.      I discussed the assessment and treatment plan with the patient. The patient was provided an opportunity to ask questions and all were answered. The patient agreed with the plan and  demonstrated an understanding of the instructions.   The patient was advised to call back or seek an in-person evaluation if the symptoms worsen or if the condition fails to improve as anticipated.    Dale Durhamharlene Kayra Crowell, MD

## 2019-04-30 LAB — NOVEL CORONAVIRUS, NAA: SARS-CoV-2, NAA: NOT DETECTED

## 2019-05-06 ENCOUNTER — Ambulatory Visit (INDEPENDENT_AMBULATORY_CARE_PROVIDER_SITE_OTHER): Payer: Medicare Other | Admitting: Internal Medicine

## 2019-05-06 ENCOUNTER — Other Ambulatory Visit: Payer: Self-pay

## 2019-05-06 ENCOUNTER — Encounter: Payer: Self-pay | Admitting: Internal Medicine

## 2019-05-06 DIAGNOSIS — D649 Anemia, unspecified: Secondary | ICD-10-CM | POA: Diagnosis not present

## 2019-05-06 DIAGNOSIS — I10 Essential (primary) hypertension: Secondary | ICD-10-CM

## 2019-05-06 DIAGNOSIS — J4520 Mild intermittent asthma, uncomplicated: Secondary | ICD-10-CM | POA: Diagnosis not present

## 2019-05-06 DIAGNOSIS — E538 Deficiency of other specified B group vitamins: Secondary | ICD-10-CM

## 2019-05-06 DIAGNOSIS — R079 Chest pain, unspecified: Secondary | ICD-10-CM | POA: Diagnosis not present

## 2019-05-06 DIAGNOSIS — K219 Gastro-esophageal reflux disease without esophagitis: Secondary | ICD-10-CM

## 2019-05-06 DIAGNOSIS — E7800 Pure hypercholesterolemia, unspecified: Secondary | ICD-10-CM

## 2019-05-06 DIAGNOSIS — R739 Hyperglycemia, unspecified: Secondary | ICD-10-CM

## 2019-05-06 NOTE — Progress Notes (Signed)
Patient ID: Lisa Robles, female   DOB: [birth year], 73 y.o.   MRN: [id number]   Virtual Visit via video Note  This visit type was conducted due to national recommendations for restrictions regarding the COVID-19 pandemic (e.g. social distancing).  This format is felt to be most appropriate for this patient at this time.  All issues noted in this document were discussed and addressed.  No physical exam was performed (except for noted visual exam findings with Video Visits).   I connected with [name] by a video enabled telemedicine application or telephone and verified that I am speaking with the correct person using two identifiers. Location patient: home Location provider: work Persons participating in the virtual visit: patient, provider  I discussed the limitations, risks, security and privacy concerns of performing an evaluation and management service by video and the availability of in person appointments. The patient expressed understanding and agreed to proceed.   Reason for visit: scheduled follow up.    HPI: This is a scheduled follow up from a recent visit.  She was evaluated 04/29/19 - chest discomfort and cough.  Her advair was increased to bid and she was placed on protonix.  COVID test - negative.  States she feels better.  No cough or congestion now.  No chest pain now.  No acid reflux.  No abdominal pain.  Some issues with mild constipation.  Discussed the need for stool softener.  Overdue B12.  Schedule.  Taking crestor.  Tolerating.  Blood pressure doing well.  Has been walking.  Overall feels good.    ROS: See pertinent positives and negatives per HPI.  Past Medical History:  Diagnosis Date  . GERD (gastroesophageal reflux disease)   . Hypercholesteremia   . Hypertension     Past Surgical History:  Procedure Laterality Date  . BREAST BIOPSY    . TOOTH EXTRACTION     tooth implantation  . TUBAL LIGATION  1979    Family History  Problem Relation Age of  Onset  . Heart attack Mother   . Heart attack Father   . Hypercholesterolemia Brother   . Cancer Maternal Aunt        breast  . Colon cancer Neg Hx   . Breast cancer Neg Hx     SOCIAL HX: reviewed.    Current Outpatient Medications:  .  albuterol (VENTOLIN HFA) 108 (90 Base) MCG/ACT inhaler, Inhale 2 puffs into the lungs every 6 (six) hours as needed., Disp: 18 g, Rfl: 2 .  aspirin 81 MG tablet, Take 81 mg by mouth daily., Disp: , Rfl:  .  benzonatate (TESSALON) 100 MG capsule, Take 1 capsule (100 mg total) by mouth 3 (three) times daily as needed for cough., Disp: 21 capsule, Rfl: 0 .  conjugated estrogens (PREMARIN) vaginal cream, Place 1 Applicatorful vaginally daily. Use pea sized amount M-W-Fr before bedtime, Disp: 42.5 g, Rfl: 12 .  fluticasone (FLONASE) 50 MCG/ACT nasal spray, Place 2 sprays into both nostrils daily., Disp: 16 g, Rfl: 1 .  Fluticasone-Salmeterol (ADVAIR DISKUS) 100-50 MCG/DOSE AEPB, inhale 1 dose by mouth twice a day, Disp: 60 each, Rfl: 2 .  guaiFENesin (MUCINEX) 600 MG 12 hr tablet, Take 2 tablets (1,200 mg total) by mouth 2 (two) times daily as needed for cough or to loosen phlegm., Disp: 20 tablet, Rfl: 0 .  hydrochlorothiazide (HYDRODIURIL) 25 MG tablet, TAKE 1 TABLET(25 MG) BY MOUTH DAILY, Disp: 90 tablet, Rfl: 1 .  loratadine (CLARITIN) 10 MG tablet, Take  10 mg by mouth daily., Disp: , Rfl:  .  meclizine (ANTIVERT) 25 MG tablet, Take 1 tablet (25 mg total) by mouth 3 (three) times daily as needed for dizziness., Disp: 30 tablet, Rfl: 0 .  pantoprazole (PROTONIX) 40 MG tablet, Take 1 tablet (40 mg total) by mouth daily., Disp: 30 tablet, Rfl: 2 .  rosuvastatin (CRESTOR) 10 MG tablet, Take 1 tablet (10 mg total) by mouth daily., Disp: 90 tablet, Rfl: 0  EXAM:  VITALS per patient if applicable: 128/78  GENERAL: alert, oriented, appears well and in no acute distress  HEENT: atraumatic, conjunttiva clear, no obvious abnormalities on inspection of external  nose and ears  NECK: normal movements of the head and neck  LUNGS: on inspection no signs of respiratory distress, breathing rate appears normal, no obvious gross SOB, gasping or wheezing  CV: no obvious cyanosis  PSYCH/NEURO: pleasant and cooperative, no obvious depression or anxiety, speech and thought processing grossly intact  ASSESSMENT AND PLAN:  Discussed the following assessment and plan:  Anemia, unspecified type  Mild intermittent asthma without complication  M76 deficiency  Chest pain, unspecified type  Gastroesophageal reflux disease, esophagitis presence not specified  Hypercholesterolemia  Hyperglycemia  Essential hypertension  Anemia Follow cbc.   Asthma Breathing stable. No sob.  Using advair daily now.  Follow.   B12 deficiency Continue B12 injections.  Overdue.    Chest pain Previous chest pain.  Started on protonix.  No chest pain now.  Continue protonix.  Follow.   GERD (gastroesophageal reflux disease) Started on protonix.  No acid reflux.  Follow.    Hypercholesterolemia On crestor.  Low cholesterol diet and exercise.  Follow lipid panel and liver function tests.    Hyperglycemia Low carb diet and exercise.  Follow met b and a1c.   Hypertension Blood pressure under good control.  Continue same medication regimen.  Follow pressures.  Follow metabolic panel.      I discussed the assessment and treatment plan with the patient. The patient was provided an opportunity to ask questions and all were answered. The patient agreed with the plan and demonstrated an understanding of the instructions.   The patient was advised to call back or seek an in-person evaluation if the symptoms worsen or if the condition fails to improve as anticipated.  I provided 11 minutes of non-face-to-face time during this encounter.   Einar Pheasant, MD

## 2019-05-11 ENCOUNTER — Encounter: Payer: Self-pay | Admitting: Internal Medicine

## 2019-05-11 NOTE — Assessment & Plan Note (Signed)
Previous chest pain.  Started on protonix.  No chest pain now.  Continue protonix.  Follow.

## 2019-05-11 NOTE — Assessment & Plan Note (Signed)
Blood pressure under good control.  Continue same medication regimen.  Follow pressures.  Follow metabolic panel.   

## 2019-05-11 NOTE — Assessment & Plan Note (Signed)
Started on protonix.  No acid reflux.  Follow.

## 2019-05-11 NOTE — Assessment & Plan Note (Signed)
Continue B12 injections.  Overdue.

## 2019-05-11 NOTE — Assessment & Plan Note (Signed)
On crestor.  Low cholesterol diet and exercise.  Follow lipid panel and liver function tests.   

## 2019-05-11 NOTE — Assessment & Plan Note (Signed)
Breathing stable. No sob.  Using advair daily now.  Follow.

## 2019-05-11 NOTE — Assessment & Plan Note (Signed)
Follow cbc.  

## 2019-05-11 NOTE — Assessment & Plan Note (Signed)
Low carb diet and exercise.  Follow met b and a1c.  

## 2019-05-13 ENCOUNTER — Ambulatory Visit: Payer: Medicare Other

## 2019-05-13 ENCOUNTER — Other Ambulatory Visit: Payer: Self-pay

## 2019-06-19 ENCOUNTER — Other Ambulatory Visit: Payer: Self-pay | Admitting: Internal Medicine

## 2019-06-19 MED ORDER — HYDROCHLOROTHIAZIDE 25 MG PO TABS: ORAL_TABLET | ORAL | 1 refills | Status: DC

## 2019-06-19 NOTE — Telephone Encounter (Signed)
Medication Refill - Medication:  hydrochlorothiazide (HYDRODIURIL) 25 MG tablet  Has the patient contacted their pharmacy? Yes advised to call  Preferred Pharmacy (with phone number or street name):  [id number] - [address], [place] [phone] (Phone) [phone] (Fax)   Agent: Please be advised that RX refills may take up to 3 business days. We ask that you follow-up with your pharmacy.

## 2019-06-19 NOTE — Telephone Encounter (Signed)
Refill sent.

## 2019-07-03 ENCOUNTER — Other Ambulatory Visit: Payer: Self-pay

## 2019-07-03 ENCOUNTER — Ambulatory Visit (INDEPENDENT_AMBULATORY_CARE_PROVIDER_SITE_OTHER): Payer: Medicare Other | Admitting: Internal Medicine

## 2019-07-03 ENCOUNTER — Encounter: Payer: Self-pay | Admitting: Internal Medicine

## 2019-07-03 DIAGNOSIS — R21 Rash and other nonspecific skin eruption: Secondary | ICD-10-CM

## 2019-07-03 MED ORDER — NYSTATIN 100000 UNIT/GM EX CREA: 1.0000 "application " | TOPICAL_CREAM | Freq: Two times a day (BID) | CUTANEOUS | 0 refills | Status: DC

## 2019-07-03 NOTE — Progress Notes (Signed)
Patient ID: Lisa Robles, female   DOB: 03-Jan-1946, 73 y.o.   MRN: [id number]   Virtual Visit via video Note  This visit type was conducted due to national recommendations for restrictions regarding the COVID-19 pandemic (e.g. social distancing).  This format is felt to be most appropriate for this patient at this time.  All issues noted in this document were discussed and addressed.  No physical exam was performed (except for noted visual exam findings with Video Visits).   I connected with [name] by a video enabled telemedicine application or telephone and verified that I am speaking with the correct person using two identifiers. Location patient: home Location provider: work  Persons participating in the virtual visit: patient, provider  I discussed the limitations, risks, security and privacy concerns of performing an evaluation and management service by video and the availability of in person appointments.  The patient expressed understanding and agreed to proceed.   Reason for visit: work in appt.   HPI: Work in appt for a rash.  Reports recently had dental surgery - four weeks ago.  Treated with amoxicillin.  Has been exercising, walking - hot weather.  Noticed rash/redness under her breasts, under her arms and in her groin.  Notices redness and some itching at times.  Used cortisone cream initially.  Changed to lamisil.  Improved and then increased walking again.  Flared again.  No fever.  No headache.  Breathing normal.  No sob.  Eating and drinking well.  Overall feels good otherwise.     ROS: See pertinent positives and negatives per HPI.  Past Medical History:  Diagnosis Date  . GERD (gastroesophageal reflux disease)   . Hypercholesteremia   . Hypertension     Past Surgical History:  Procedure Laterality Date  . BREAST BIOPSY    . TOOTH EXTRACTION     tooth implantation  . TUBAL LIGATION  1979    Family History  Problem Relation Age of Onset  . Heart  attack Mother   . Heart attack Father   . Hypercholesterolemia Brother   . Cancer Maternal Aunt        breast  . Colon cancer Neg Hx   . Breast cancer Neg Hx     SOCIAL HX: reviewed.    Current Outpatient Medications:  .  albuterol (VENTOLIN HFA) 108 (90 Base) MCG/ACT inhaler, Inhale 2 puffs into the lungs every 6 (six) hours as needed., Disp: 18 g, Rfl: 2 .  aspirin 81 MG tablet, Take 81 mg by mouth daily., Disp: , Rfl:  .  benzonatate (TESSALON) 100 MG capsule, Take 1 capsule (100 mg total) by mouth 3 (three) times daily as needed for cough., Disp: 21 capsule, Rfl: 0 .  conjugated estrogens (PREMARIN) vaginal cream, Place 1 Applicatorful vaginally daily. Use pea sized amount M-W-Fr before bedtime, Disp: 42.5 g, Rfl: 12 .  fluticasone (FLONASE) 50 MCG/ACT nasal spray, Place 2 sprays into both nostrils daily., Disp: 16 g, Rfl: 1 .  Fluticasone-Salmeterol (ADVAIR DISKUS) 100-50 MCG/DOSE AEPB, inhale 1 dose by mouth twice a day, Disp: 60 each, Rfl: 2 .  guaiFENesin (MUCINEX) 600 MG 12 hr tablet, Take 2 tablets (1,200 mg total) by mouth 2 (two) times daily as needed for cough or to loosen phlegm., Disp: 20 tablet, Rfl: 0 .  hydrochlorothiazide (HYDRODIURIL) 25 MG tablet, TAKE 1 TABLET(25 MG) BY MOUTH DAILY, Disp: 90 tablet, Rfl: 1 .  loratadine (CLARITIN) 10 MG tablet, Take 10 mg by mouth daily., Disp: ,  Rfl:  .  meclizine (ANTIVERT) 25 MG tablet, Take 1 tablet (25 mg total) by mouth 3 (three) times daily as needed for dizziness., Disp: 30 tablet, Rfl: 0 .  nystatin cream (MYCOSTATIN), Apply 1 application topically 2 (two) times daily., Disp: 60 g, Rfl: 0 .  pantoprazole (PROTONIX) 40 MG tablet, Take 1 tablet (40 mg total) by mouth daily., Disp: 30 tablet, Rfl: 2 .  rosuvastatin (CRESTOR) 10 MG tablet, Take 1 tablet (10 mg total) by mouth daily., Disp: 90 tablet, Rfl: 0  EXAM:  GENERAL: alert, oriented, appears well and in no acute distress  HEENT: atraumatic, conjunttiva clear, no  obvious abnormalities on inspection of external nose and ears  NECK: normal movements of the head and neck  LUNGS: on inspection no signs of respiratory distress, breathing rate appears normal, no obvious gross SOB, gasping or wheezing  CV: no obvious cyanosis  PSYCH/NEURO: pleasant and cooperative, no obvious depression or anxiety, speech and thought processing grossly intact  ASSESSMENT AND PLAN:  Discussed the following assessment and plan:  Rash Appears to be more c/w yeast.  Discussed moisture and heat aggravating.  Try to keep the area as dry as possible.  Nystatin cream as directed.  Follow.  Notify me if persistent.      I discussed the assessment and treatment plan with the patient. The patient was provided an opportunity to ask questions and all were answered. The patient agreed with the plan and demonstrated an understanding of the instructions.   The patient was advised to call back or seek an in-person evaluation if the symptoms worsen or if the condition fails to improve as anticipated.   Dale Durhamharlene Gayl Ivanoff, MD

## 2019-07-06 ENCOUNTER — Encounter: Payer: Self-pay | Admitting: Internal Medicine

## 2019-07-06 DIAGNOSIS — R21 Rash and other nonspecific skin eruption: Secondary | ICD-10-CM | POA: Insufficient documentation

## 2019-07-06 NOTE — Assessment & Plan Note (Signed)
Appears to be more c/w yeast.  Discussed moisture and heat aggravating.  Try to keep the area as dry as possible.  Nystatin cream as directed.  Follow.  Notify me if persistent.

## 2019-07-13 ENCOUNTER — Other Ambulatory Visit: Payer: Self-pay | Admitting: Family Medicine

## 2019-07-13 DIAGNOSIS — J208 Acute bronchitis due to other specified organisms: Secondary | ICD-10-CM

## 2019-08-12 ENCOUNTER — Other Ambulatory Visit: Payer: Self-pay | Admitting: Internal Medicine

## 2019-08-12 DIAGNOSIS — Z1231 Encounter for screening mammogram for malignant neoplasm of breast: Secondary | ICD-10-CM

## 2019-08-13 ENCOUNTER — Other Ambulatory Visit: Payer: Self-pay

## 2019-08-13 DIAGNOSIS — Z20822 Contact with and (suspected) exposure to covid-19: Secondary | ICD-10-CM

## 2019-08-15 LAB — NOVEL CORONAVIRUS, NAA: SARS-CoV-2, NAA: NOT DETECTED

## 2019-08-16 ENCOUNTER — Telehealth: Payer: Self-pay

## 2019-08-16 NOTE — Telephone Encounter (Signed)
Pt notified of negative COVID-19 results. Understanding verbalized.   

## 2019-08-19 ENCOUNTER — Ambulatory Visit: Payer: Medicare Other | Admitting: Internal Medicine

## 2019-09-08 ENCOUNTER — Telehealth: Payer: Self-pay

## 2019-09-08 NOTE — Telephone Encounter (Signed)
A1C has not changed since January.

## 2019-09-08 NOTE — Telephone Encounter (Signed)
Notify pt that we received her a1c results.  Her overall sugar control is elevated above normal.  (pre diabetes).  I recommend a low carb diet and exercise.  We will follow.

## 2019-09-08 NOTE — Telephone Encounter (Signed)
Copied from [place] [id number]. Topic: General - Other >> Sep 08, 2019 11:09 AM Jodie Echevaria wrote: Reason for CRM: Jimmey Ralph RN called to inform Dr Nicki Reaper that patient had an A1C of 6.0 this morning but is not a known diabetic. Any questions please call Diane at Ph# [phone]

## 2019-09-09 NOTE — Telephone Encounter (Signed)
Patient aware.

## 2019-09-25 ENCOUNTER — Other Ambulatory Visit: Payer: Self-pay

## 2019-09-25 ENCOUNTER — Ambulatory Visit
Admission: RE | Admit: 2019-09-25 | Discharge: 2019-09-25 | Disposition: A | Discharge status: Home / Self Care | Payer: Medicare Other | Source: Ambulatory Visit | Attending: Internal Medicine | Admitting: Internal Medicine

## 2019-09-25 DIAGNOSIS — Z1231 Encounter for screening mammogram for malignant neoplasm of breast: Secondary | ICD-10-CM

## 2019-10-16 ENCOUNTER — Other Ambulatory Visit: Payer: Self-pay

## 2019-10-16 ENCOUNTER — Ambulatory Visit (INDEPENDENT_AMBULATORY_CARE_PROVIDER_SITE_OTHER): Payer: Medicare Other | Admitting: Internal Medicine

## 2019-10-16 ENCOUNTER — Encounter: Payer: Self-pay | Admitting: Internal Medicine

## 2019-10-16 DIAGNOSIS — E7800 Pure hypercholesterolemia, unspecified: Secondary | ICD-10-CM

## 2019-10-16 DIAGNOSIS — R739 Hyperglycemia, unspecified: Secondary | ICD-10-CM

## 2019-10-16 DIAGNOSIS — J4520 Mild intermittent asthma, uncomplicated: Secondary | ICD-10-CM

## 2019-10-16 DIAGNOSIS — D649 Anemia, unspecified: Secondary | ICD-10-CM

## 2019-10-16 DIAGNOSIS — K219 Gastro-esophageal reflux disease without esophagitis: Secondary | ICD-10-CM | POA: Diagnosis not present

## 2019-10-16 DIAGNOSIS — I10 Essential (primary) hypertension: Secondary | ICD-10-CM

## 2019-10-16 NOTE — Progress Notes (Signed)
Patient ID: Lisa Robles, female   DOB: 10/18/1946, 73 y.o.   MRN: [id number]   Virtual Visit via video Note  This visit type was conducted due to national recommendations for restrictions regarding the COVID-19 pandemic (e.g. social distancing).  This format is felt to be most appropriate for this patient at this time.  All issues noted in this document were discussed and addressed.  No physical exam was performed (except for noted visual exam findings with Video Visits).   I connected with [name] by a video enabled telemedicine application and verified that I am speaking with the correct person using two identifiers. Location patient: home Location provider: work  Persons participating in the virtual visit: patient, provider  I discussed the limitations, risks, security and privacy concerns of performing an evaluation and management service by video and the availability of in person appointments.  The patient expressed understanding and agreed to proceed.   Reason for visit: scheduled follow up.   HPI: She reports she is doing well.  Feels good.  Taking protonix.  This has helped her acid reflux.  No chest pain.  No sob.  Using advair.  Breathing stable.  No abdominal pain.  Bowels moving.  Taking crestor and tolerating.  Had skin assessment last month - [facility].  She is staying with her daughter through the week.  Helping her grandchild with school through the week.  This is going well.  Trying to stay in due to covid restrictions.  Some allergy symptoms - zyrtec and nasal spray.  Overall doing well.  States blood pressures averaging 130-135/82-83.     ROS: See pertinent positives and negatives per HPI.  Past Medical History:  Diagnosis Date  . GERD (gastroesophageal reflux disease)   . Hypercholesteremia   . Hypertension     Past Surgical History:  Procedure Laterality Date  . BREAST BIOPSY    . TOOTH EXTRACTION     tooth implantation  . TUBAL LIGATION   1979    Family History  Problem Relation Age of Onset  . Heart attack Mother   . Heart attack Father   . Hypercholesterolemia Brother   . Cancer Maternal Aunt        breast  . Colon cancer Neg Hx   . Breast cancer Neg Hx     SOCIAL HX: reviewed.    Current Outpatient Medications:  .  albuterol (VENTOLIN HFA) 108 (90 Base) MCG/ACT inhaler, Inhale 2 puffs into the lungs every 6 (six) hours as needed., Disp: 18 g, Rfl: 2 .  aspirin 81 MG tablet, Take 81 mg by mouth daily., Disp: , Rfl:  .  benzonatate (TESSALON) 100 MG capsule, Take 1 capsule (100 mg total) by mouth 3 (three) times daily as needed for cough., Disp: 21 capsule, Rfl: 0 .  conjugated estrogens (PREMARIN) vaginal cream, Place 1 Applicatorful vaginally daily. Use pea sized amount M-W-Fr before bedtime, Disp: 42.5 g, Rfl: 12 .  fluticasone (FLONASE) 50 MCG/ACT nasal spray, Place 2 sprays into both nostrils daily., Disp: 16 g, Rfl: 1 .  guaiFENesin (MUCINEX) 600 MG 12 hr tablet, Take 2 tablets (1,200 mg total) by mouth 2 (two) times daily as needed for cough or to loosen phlegm., Disp: 20 tablet, Rfl: 0 .  hydrochlorothiazide (HYDRODIURIL) 25 MG tablet, TAKE 1 TABLET(25 MG) BY MOUTH DAILY, Disp: 90 tablet, Rfl: 1 .  loratadine (CLARITIN) 10 MG tablet, Take 10 mg by mouth daily., Disp: , Rfl:  .  meclizine (ANTIVERT) 25  MG tablet, Take 1 tablet (25 mg total) by mouth 3 (three) times daily as needed for dizziness., Disp: 30 tablet, Rfl: 0 .  nystatin cream (MYCOSTATIN), Apply 1 application topically 2 (two) times daily., Disp: 60 g, Rfl: 0 .  pantoprazole (PROTONIX) 40 MG tablet, Take 1 tablet (40 mg total) by mouth daily., Disp: 30 tablet, Rfl: 2 .  rosuvastatin (CRESTOR) 10 MG tablet, Take 1 tablet (10 mg total) by mouth daily., Disp: 90 tablet, Rfl: 0 .  WIXELA INHUB 100-50 MCG/DOSE AEPB, INHALE 1 PUFF BY MOUTH TWICE DAILY, Disp: 60 each, Rfl: 2  EXAM:  VITALS per patient if applicable: [id number]  GENERAL: alert,  oriented, appears well and in no acute distress  HEENT: atraumatic, conjunttiva clear, no obvious abnormalities on inspection of external nose and ears  NECK: normal movements of the head and neck  LUNGS: on inspection no signs of respiratory distress, breathing rate appears normal, no obvious gross SOB, gasping or wheezing  CV: no obvious cyanosis  PSYCH/NEURO: pleasant and cooperative, no obvious depression or anxiety, speech and thought processing grossly intact  ASSESSMENT AND PLAN:  Discussed the following assessment and plan:  Anemia Follow cbc.   Asthma Breathing stable.  Continue advair daily.  Discussed with pt.    GERD (gastroesophageal reflux disease) Doing well on protonix.  Follow.    Hypercholesterolemia On crestor.  Low cholesterol diet and exercise.  Follow lipid panel and liver function tests.    Hyperglycemia Low carb diet and exercise.  Follow met b and a1c.   Hypertension Blood pressure as outlined.  Continue same medication regimen.  Follow pressures.  Follow metabolic panel.       I discussed the assessment and treatment plan with the patient. The patient was provided an opportunity to ask questions and all were answered. The patient agreed with the plan and demonstrated an understanding of the instructions.   The patient was advised to call back or seek an in-person evaluation if the symptoms worsen or if the condition fails to improve as anticipated.   Einar Pheasant, MD

## 2019-10-19 ENCOUNTER — Encounter: Payer: Self-pay | Admitting: Internal Medicine

## 2019-10-19 NOTE — Assessment & Plan Note (Signed)
Doing well on protonix.  Follow.   

## 2019-10-19 NOTE — Assessment & Plan Note (Signed)
Follow cbc.  

## 2019-10-19 NOTE — Assessment & Plan Note (Signed)
Low carb diet and exercise.  Follow met b and a1c.  

## 2019-10-19 NOTE — Assessment & Plan Note (Signed)
On crestor.  Low cholesterol diet and exercise.  Follow lipid panel and liver function tests.   

## 2019-10-19 NOTE — Assessment & Plan Note (Signed)
Blood pressure as outlined.  Continue same medication regimen.  Follow pressures.  Follow metabolic panel.  

## 2019-10-19 NOTE — Assessment & Plan Note (Signed)
Breathing stable.  Continue advair daily.  Discussed with pt.

## 2019-12-01 ENCOUNTER — Other Ambulatory Visit: Payer: Self-pay | Admitting: Internal Medicine

## 2019-12-01 NOTE — Telephone Encounter (Signed)
Pt called about needing a refill for medication of pantoprazole (PROTONIX) 40 MG tablet.  Pharmacy is [id number] - [address], [place] AT [facility] [address] [facility]  Call pt @ [phone] . Thank you!

## 2019-12-03 MED ORDER — PANTOPRAZOLE SODIUM 40 MG PO TBEC: 40.0000 mg | DELAYED_RELEASE_TABLET | Freq: Every day | ORAL | 2 refills | Status: DC

## 2019-12-03 NOTE — Telephone Encounter (Signed)
Refill sent as requested. 

## 2019-12-03 NOTE — Addendum Note (Signed)
Addended by: Nanci Pina on: 12/03/2019 09:37 AM   Modules accepted: Orders

## 2019-12-31 ENCOUNTER — Other Ambulatory Visit: Payer: Self-pay

## 2019-12-31 ENCOUNTER — Telehealth: Payer: Self-pay | Admitting: Internal Medicine

## 2019-12-31 MED ORDER — HYDROCHLOROTHIAZIDE 25 MG PO TABS: ORAL_TABLET | ORAL | 1 refills | Status: DC

## 2019-12-31 NOTE — Telephone Encounter (Signed)
Pt needs a refill on hydrochlorothiazide (HYDRODIURIL) 25 MG tablet sent to [facility] in [place]  Pt is out

## 2019-12-31 NOTE — Telephone Encounter (Signed)
Rx refilled.

## 2020-01-15 ENCOUNTER — Other Ambulatory Visit: Payer: Self-pay

## 2020-01-20 ENCOUNTER — Other Ambulatory Visit (INDEPENDENT_AMBULATORY_CARE_PROVIDER_SITE_OTHER): Payer: Medicare PPO

## 2020-01-20 ENCOUNTER — Other Ambulatory Visit: Payer: Self-pay

## 2020-01-20 DIAGNOSIS — D649 Anemia, unspecified: Secondary | ICD-10-CM

## 2020-01-20 DIAGNOSIS — E7800 Pure hypercholesterolemia, unspecified: Secondary | ICD-10-CM | POA: Diagnosis not present

## 2020-01-20 DIAGNOSIS — R739 Hyperglycemia, unspecified: Secondary | ICD-10-CM | POA: Diagnosis not present

## 2020-01-20 DIAGNOSIS — I10 Essential (primary) hypertension: Secondary | ICD-10-CM | POA: Diagnosis not present

## 2020-01-20 LAB — CBC WITH DIFFERENTIAL/PLATELET
Basophils Absolute: 0.1 10*3/uL (ref 0.0–0.1)
Basophils Relative: 0.7 % (ref 0.0–3.0)
Eosinophils Absolute: 0.2 10*3/uL (ref 0.0–0.7)
Eosinophils Relative: 2.1 % (ref 0.0–5.0)
HCT: 41.1 % (ref 36.0–46.0)
Hemoglobin: 13.6 g/dL (ref 12.0–15.0)
Lymphocytes Relative: 44.5 % (ref 12.0–46.0)
Lymphs Abs: 3.6 10*3/uL (ref 0.7–4.0)
MCHC: 33.1 g/dL (ref 30.0–36.0)
MCV: 92.8 fl (ref 78.0–100.0)
Monocytes Absolute: 0.9 10*3/uL (ref 0.1–1.0)
Monocytes Relative: 10.5 % (ref 3.0–12.0)
Neutro Abs: 3.4 10*3/uL (ref 1.4–7.7)
Neutrophils Relative %: 42.2 % — ABNORMAL LOW (ref 43.0–77.0)
Platelets: 350 10*3/uL (ref 150.0–400.0)
RBC: 4.43 Mil/uL (ref 3.87–5.11)
RDW: 14.2 % (ref 11.5–15.5)
WBC: 8.1 10*3/uL (ref 4.0–10.5)

## 2020-01-20 LAB — HEPATIC FUNCTION PANEL
ALT: 14 U/L (ref 0–35)
AST: 19 U/L (ref 0–37)
Albumin: 4.2 g/dL (ref 3.5–5.2)
Alkaline Phosphatase: 73 U/L (ref 39–117)
Bilirubin, Direct: 0.1 mg/dL (ref 0.0–0.3)
Total Bilirubin: 0.6 mg/dL (ref 0.2–1.2)
Total Protein: 8.1 g/dL (ref 6.0–8.3)

## 2020-01-20 LAB — LIPID PANEL
Cholesterol: 274 mg/dL — ABNORMAL HIGH (ref 0–200)
HDL: 56.4 mg/dL (ref >39.00)
LDL Cholesterol: 186 mg/dL — ABNORMAL HIGH (ref 0–99)
NonHDL: 217.79
Total CHOL/HDL Ratio: 5
Triglycerides: 159 mg/dL — ABNORMAL HIGH (ref 0.0–149.0)
VLDL: 31.8 mg/dL (ref 0.0–40.0)

## 2020-01-20 LAB — HEMOGLOBIN A1C: Hgb A1c MFr Bld: 6.5 % (ref 4.6–6.5)

## 2020-01-20 LAB — BASIC METABOLIC PANEL
BUN: 16 mg/dL (ref 6–23)
CO2: 30 mEq/L (ref 19–32)
Calcium: 10.3 mg/dL (ref 8.4–10.5)
Chloride: 100 mEq/L (ref 96–112)
Creatinine, Ser: 0.88 mg/dL (ref 0.40–1.20)
GFR: 75.67 mL/min (ref >60.00)
Glucose, Bld: 113 mg/dL — ABNORMAL HIGH (ref 70–99)
Potassium: 3.8 mEq/L (ref 3.5–5.1)
Sodium: 136 mEq/L (ref 135–145)

## 2020-01-20 LAB — TSH: TSH: 1.86 u[IU]/mL (ref 0.35–4.50)

## 2020-01-23 ENCOUNTER — Ambulatory Visit (INDEPENDENT_AMBULATORY_CARE_PROVIDER_SITE_OTHER): Payer: Medicare PPO | Admitting: Internal Medicine

## 2020-01-23 ENCOUNTER — Encounter: Payer: Self-pay | Admitting: Internal Medicine

## 2020-01-23 ENCOUNTER — Other Ambulatory Visit: Payer: Self-pay

## 2020-01-23 DIAGNOSIS — E119 Type 2 diabetes mellitus without complications: Secondary | ICD-10-CM | POA: Diagnosis not present

## 2020-01-23 DIAGNOSIS — R5383 Other fatigue: Secondary | ICD-10-CM

## 2020-01-23 DIAGNOSIS — K219 Gastro-esophageal reflux disease without esophagitis: Secondary | ICD-10-CM | POA: Diagnosis not present

## 2020-01-23 DIAGNOSIS — D649 Anemia, unspecified: Secondary | ICD-10-CM | POA: Diagnosis not present

## 2020-01-23 DIAGNOSIS — E7800 Pure hypercholesterolemia, unspecified: Secondary | ICD-10-CM | POA: Diagnosis not present

## 2020-01-23 DIAGNOSIS — I10 Essential (primary) hypertension: Secondary | ICD-10-CM | POA: Diagnosis not present

## 2020-01-23 MED ORDER — PRAVASTATIN SODIUM 20 MG PO TABS: 20.0000 mg | ORAL_TABLET | Freq: Every day | ORAL | 2 refills | Status: DC

## 2020-01-23 NOTE — Progress Notes (Signed)
Patient ID: Lisa Robles, female   DOB: July 21, 1946, 74 y.o.   MRN: [id number]   Virtual Visit via video Note  This visit type was conducted due to national recommendations for restrictions regarding the COVID-19 pandemic (e.g. social distancing).  This format is felt to be most appropriate for this patient at this time.  All issues noted in this document were discussed and addressed.  No physical exam was performed (except for noted visual exam findings with Video Visits).   I connected with [name] by a video enabled telemedicine application and verified that I am speaking with the correct person using two identifiers. Location patient: home Location provider: work  Persons participating in the virtual visit: patient, provider  The limitations, risks, security and privacy concerns of performing an evaluation and management service by video and the availability of in person appointments have been discussed.  The patient expressed understanding and agreed to proceed.   Reason for visit: work in appt.    HPI: Work in appt to discussed labs.  Recent a1c 6.5.  Discussed diagnosis of diabetes.  Discussed low carb diet and exercise.  Discussed Lif[facility]yles referral for diabetes education and diet instruction.  She is agreeable.  Has not been watching her diet as well.  Discussed elevated cholesterol and treatment options.  No chest pain.  No sob.  She does report increased fatigue - with exertion.  States this is a change for her.  Parents had CAD and sister - bypass x 2.  Request referral to cardiology for further evaluation.  No abdominal pain reported.     ROS: See pertinent positives and negatives per HPI.  Past Medical History:  Diagnosis Date  . GERD (gastroesophageal reflux disease)   . Hypercholesteremia   . Hypertension     Past Surgical History:  Procedure Laterality Date  . BREAST BIOPSY    . TOOTH EXTRACTION     tooth implantation  . TUBAL LIGATION  1979    Family  History  Problem Relation Age of Onset  . Heart attack Mother   . Heart attack Father   . Hypercholesterolemia Brother   . Cancer Maternal Aunt        breast  . Colon cancer Neg Hx   . Breast cancer Neg Hx     SOCIAL HX: reviewed.    Current Outpatient Medications:  .  albuterol (VENTOLIN HFA) 108 (90 Base) MCG/ACT inhaler, Inhale 2 puffs into the lungs every 6 (six) hours as needed., Disp: 18 g, Rfl: 2 .  aspirin 81 MG tablet, Take 81 mg by mouth daily., Disp: , Rfl:  .  benzonatate (TESSALON) 100 MG capsule, Take 1 capsule (100 mg total) by mouth 3 (three) times daily as needed for cough., Disp: 21 capsule, Rfl: 0 .  conjugated estrogens (PREMARIN) vaginal cream, Place 1 Applicatorful vaginally daily. Use pea sized amount M-W-Fr before bedtime, Disp: 42.5 g, Rfl: 12 .  fluticasone (FLONASE) 50 MCG/ACT nasal spray, Place 2 sprays into both nostrils daily., Disp: 16 g, Rfl: 1 .  guaiFENesin (MUCINEX) 600 MG 12 hr tablet, Take 2 tablets (1,200 mg total) by mouth 2 (two) times daily as needed for cough or to loosen phlegm., Disp: 20 tablet, Rfl: 0 .  hydrochlorothiazide (HYDRODIURIL) 25 MG tablet, TAKE 1 TABLET(25 MG) BY MOUTH DAILY, Disp: 90 tablet, Rfl: 1 .  loratadine (CLARITIN) 10 MG tablet, Take 10 mg by mouth daily., Disp: , Rfl:  .  meclizine (ANTIVERT) 25 MG tablet, Take 1  tablet (25 mg total) by mouth 3 (three) times daily as needed for dizziness., Disp: 30 tablet, Rfl: 0 .  nystatin cream (MYCOSTATIN), Apply 1 application topically 2 (two) times daily., Disp: 60 g, Rfl: 0 .  pantoprazole (PROTONIX) 40 MG tablet, Take 1 tablet (40 mg total) by mouth daily., Disp: 30 tablet, Rfl: 2 .  WIXELA INHUB 100-50 MCG/DOSE AEPB, INHALE 1 PUFF BY MOUTH TWICE DAILY, Disp: 60 each, Rfl: 2 .  pravastatin (PRAVACHOL) 20 MG tablet, Take 1 tablet (20 mg total) by mouth daily., Disp: 30 tablet, Rfl: 2  EXAM:  GENERAL: alert, oriented, appears well and in no acute distress  HEENT: atraumatic,  conjunttiva clear, no obvious abnormalities on inspection of external nose and ears  NECK: normal movements of the head and neck  LUNGS: on inspection no signs of respiratory distress, breathing rate appears normal, no obvious gross SOB, gasping or wheezing  CV: no obvious cyanosis  PSYCH/NEURO: pleasant and cooperative, no obvious depression or anxiety, speech and thought processing grossly intact  ASSESSMENT AND PLAN:  Discussed the following assessment and plan:  Anemia Follow cbc.   Diabetes mellitus without complication (HCC) a1c checked - 6.5.  Discussed low carb diet and exercise.  Hold on starting medication.  Refer to Lifestyles for diabetes education and diet instruction.  Follow met b and a1c.    GERD (gastroesophageal reflux disease) On protonix.  No problems reported.   Hypercholesterolemia Discussed recent cholesterol labs.  Discussed treatment options.  Pravastatin 20mg q day.  Follow lipid panel and liver function tests.    Hypertension Remains on hctz.  Follow pressures.  Follow metabolic panel.    Fatigue Fatigue with exertion as outlined.  No chest pain.  No sob.  Family history of heart disease as outlined.  Will refer to cardiology for further evaluation and question of need for further cardiac w/up and evaluation.  (discussed further heart testing including echo, stress test or the possibility of calcium score).  Continue risk factor modification.    Orders Placed This Encounter  Procedures  . Hepatic function panel    Standing Status:   Future    Standing Expiration Date:   01/22/2021  . Ambulatory referral to Cardiology    Referral Priority:   Routine    Referral Type:   Consultation    Referral Reason:   Specialty Services Required    Requested Specialty:   Cardiology    Number of Visits Requested:   1  . Ambulatory referral to diabetic education    Referral Priority:   Routine    Referral Type:   Consultation    Referral Reason:   Specialty  Services Required    Number of Visits Requested:   1    Meds ordered this encounter  Medications  . pravastatin (PRAVACHOL) 20 MG tablet    Sig: Take 1 tablet (20 mg total) by mouth daily.    Dispense:  30 tablet    Refill:  2     I discussed the assessment and treatment plan with the patient. The patient was provided an opportunity to ask questions and all were answered. The patient agreed with the plan and demonstrated an understanding of the instructions.   The patient was advised to call back or seek an in-person evaluation if the symptoms worsen or if the condition fails to improve as anticipated.   Charlene [name], MD  

## 2020-02-01 ENCOUNTER — Encounter: Payer: Self-pay | Admitting: Internal Medicine

## 2020-02-01 DIAGNOSIS — R5383 Other fatigue: Secondary | ICD-10-CM | POA: Insufficient documentation

## 2020-02-01 NOTE — Assessment & Plan Note (Signed)
On protonix.  No problems reported.  

## 2020-02-01 NOTE — Assessment & Plan Note (Signed)
Discussed recent cholesterol labs.  Discussed treatment options.  Pravastatin 20mg  q day.  Follow lipid panel and liver function tests.

## 2020-02-01 NOTE — Assessment & Plan Note (Signed)
Remains on hctz.  Follow pressures.  Follow metabolic panel.

## 2020-02-01 NOTE — Assessment & Plan Note (Signed)
Follow cbc.  

## 2020-02-01 NOTE — Assessment & Plan Note (Signed)
Fatigue with exertion as outlined.  No chest pain.  No sob.  Family history of heart disease as outlined.  Will refer to cardiology for further evaluation and question of need for further cardiac w/up and evaluation.  (discussed further heart testing including echo, stress test or the possibility of calcium score).  Continue risk factor modification.

## 2020-02-01 NOTE — Assessment & Plan Note (Signed)
a1c checked - 6.5.  Discussed low carb diet and exercise.  Hold on starting medication.  Refer to Lifestyles for diabetes education and diet instruction.  Follow met b and a1c.

## 2020-02-03 ENCOUNTER — Encounter: Payer: Self-pay | Admitting: Cardiology

## 2020-02-03 ENCOUNTER — Other Ambulatory Visit: Payer: Self-pay

## 2020-02-03 ENCOUNTER — Ambulatory Visit (INDEPENDENT_AMBULATORY_CARE_PROVIDER_SITE_OTHER): Payer: Medicare PPO | Admitting: Cardiology

## 2020-02-03 VITALS — BP 132/84 | HR 69 | Ht 63.0 in | Wt 183.0 lb

## 2020-02-03 DIAGNOSIS — R5383 Other fatigue: Secondary | ICD-10-CM | POA: Diagnosis not present

## 2020-02-03 DIAGNOSIS — E7800 Pure hypercholesterolemia, unspecified: Secondary | ICD-10-CM

## 2020-02-03 DIAGNOSIS — I10 Essential (primary) hypertension: Secondary | ICD-10-CM

## 2020-02-03 NOTE — Patient Instructions (Signed)

## 2020-02-03 NOTE — Progress Notes (Signed)
Cardiology Office Note:    Date:  02/03/2020   ID:  Lisa Robles, DOB 1946/08/09, MRN [id number]  PCP:  Dale Fults, MD  Cardiologist:  Debbe Odea, MD  Electrophysiologist:  None   Referring MD: Dale Exira, MD   Chief Complaint  Patient presents with  . New Patient (Initial Visit)    Referred by PCP for Hypercholesterolemia, and elevated BP. Meds reviewed verbally with patient,,    History of Present Illness:    Lisa Robles is a 74 y.o. female with a hx of hypertension, hyperlipidemia who presents due to fatigue.  Patient states feeling more fatigued over the past 6 months.  Symptoms are state roughly the same.  She usually walks a mile daily for about 30 minutes.  She stopped walking 2 months ago due to COVID-19 pandemic.  She is able to do all her activities at home without any chest pain or shortness of breath.  She denies having any symptoms of chest pain or shortness of breath at rest or with exertion.  She used to be on a monthly vitamin B12 shot due to deficiency which she stopped taking about 11 months ago.  She states having a family history of CAD in the mother, father and sister.  She is concerned about having coronary artery disease.  She used to be on Zocor for hyperlipidemia, but stopped taking because she does not like taking meds.  Her last lipid panel was elevated and Pravachol was started.  She smoked in the past for roughly 10 years.  The patient denies orthopnea, lower extremity edema, palpitations, presyncope or syncope.  Past Medical History:  Diagnosis Date  . GERD (gastroesophageal reflux disease)   . Hypercholesteremia   . Hypertension     Past Surgical History:  Procedure Laterality Date  . BREAST BIOPSY    . TOOTH EXTRACTION     tooth implantation  . TUBAL LIGATION  1979    Current Medications: Current Meds  Medication Sig  . albuterol (VENTOLIN HFA) 108 (90 Base) MCG/ACT inhaler Inhale 2 puffs into the lungs every 6 (six)  hours as needed.  Marland Kitchen aspirin 81 MG tablet Take 81 mg by mouth daily.  . benzonatate (TESSALON) 100 MG capsule Take 1 capsule (100 mg total) by mouth 3 (three) times daily as needed for cough.  . conjugated estrogens (PREMARIN) vaginal cream Place 1 Applicatorful vaginally daily. Use pea sized amount M-W-Fr before bedtime  . fluticasone (FLONASE) 50 MCG/ACT nasal spray Place 2 sprays into both nostrils daily.  Marland Kitchen guaiFENesin (MUCINEX) 600 MG 12 hr tablet Take 2 tablets (1,200 mg total) by mouth 2 (two) times daily as needed for cough or to loosen phlegm.  . hydrochlorothiazide (HYDRODIURIL) 25 MG tablet TAKE 1 TABLET(25 MG) BY MOUTH DAILY  . loratadine (CLARITIN) 10 MG tablet Take 10 mg by mouth daily.  . meclizine (ANTIVERT) 25 MG tablet Take 1 tablet (25 mg total) by mouth 3 (three) times daily as needed for dizziness.  . nystatin cream (MYCOSTATIN) Apply 1 application topically 2 (two) times daily.  . pantoprazole (PROTONIX) 40 MG tablet Take 1 tablet (40 mg total) by mouth daily.  . pravastatin (PRAVACHOL) 20 MG tablet Take 1 tablet (20 mg total) by mouth daily.  Monte Fantasia INHUB 100-50 MCG/DOSE AEPB INHALE 1 PUFF BY MOUTH TWICE DAILY     Allergies:   Cinnamon, Bactrim [sulfamethoxazole-trimethoprim], and Shellfish allergy   Social History   Socioeconomic History  . Marital status: Widowed  Spouse name: Not on file  . Number of children: 2  . Years of education: masters  . Highest education level: Not on file  Occupational History  . Occupation: retired Runner, broadcasting/film/video  Tobacco Use  . Smoking status: Former Smoker    Quit date: 12/04/1977    Years since quitting: 42.1  . Smokeless tobacco: Never Used  Substance and Sexual Activity  . Alcohol use: No    Alcohol/week: 0.0 standard drinks  . Drug use: No  . Sexual activity: Yes    Comment: Is sexually active wih same partner x 14 years. Recently found out partner has been cheating.  Other Topics Concern  . Not on file  Social History  Narrative  . Not on file   Social Determinants of Health   Financial Resource Strain: Low Risk   . Difficulty of Paying Living Expenses: Not hard at all  Food Insecurity: No Food Insecurity  . Worried About Programme researcher, broadcasting/film/video in the Last Year: Never true  . Ran Out of Food in the Last Year: Never true  Transportation Needs: No Transportation Needs  . Lack of Transportation (Medical): No  . Lack of Transportation (Non-Medical): No  Physical Activity:   . Days of Exercise per Week: Not on file  . Minutes of Exercise per Session: Not on file  Stress: No Stress Concern Present  . Feeling of Stress : Not at all  Social Connections:   . Frequency of Communication with Friends and Family: Not on file  . Frequency of Social Gatherings with Friends and Family: Not on file  . Attends Religious Services: Not on file  . Active Member of Clubs or Organizations: Not on file  . Attends Banker Meetings: Not on file  . Marital Status: Not on file     Family History: The patient's family history includes Cancer in her maternal aunt; Heart attack in her father and mother; Hypercholesterolemia in her brother. There is no history of Colon cancer or Breast cancer.  ROS:   Please see the history of present illness.     All other systems reviewed and are negative.  EKGs/Labs/Other Studies Reviewed:    The following studies were reviewed today:   EKG:  EKG is  ordered today.  The ekg ordered today demonstrates sinus rhythm, left axis deviation.  Recent Labs: 01/20/2020: ALT 14; BUN 16; Creatinine, Ser 0.88; Hemoglobin 13.6; Platelets 350.0; Potassium 3.8; Sodium 136; TSH 1.86  Recent Lipid Panel    Component Value Date/Time   CHOL 274 (H) 01/20/2020 0916   TRIG 159.0 (H) 01/20/2020 0916   HDL 56.40 01/20/2020 0916   CHOLHDL 5 01/20/2020 0916   VLDL 31.8 01/20/2020 0916   LDLCALC 186 (H) 01/20/2020 0916   LDLDIRECT 142.7 12/11/2013 0813    Physical Exam:    VS:  BP 132/84  (BP Location: Right Arm, Patient Position: Sitting, Cuff Size: Normal)   Pulse 69   Ht 5\' 3"  (1.6 m)   Wt 183 lb (83 kg)   SpO2 98%   BMI 32.42 kg/m     Wt Readings from Last 3 Encounters:  02/03/20 183 lb (83 kg)  10/16/19 182 lb (82.6 kg)  02/11/19 182 lb (82.6 kg)     GEN:  Well nourished, well developed in no acute distress HEENT: Normal NECK: No JVD; No carotid bruits LYMPHATICS: No lymphadenopathy CARDIAC: RRR, no murmurs, rubs, gallops RESPIRATORY:  Clear to auscultation without rales, wheezing or rhonchi  ABDOMEN: Soft, non-tender, non-distended  MUSCULOSKELETAL:  No edema; No deformity  SKIN: Warm and dry NEUROLOGIC:  Alert and oriented x 3 PSYCHIATRIC:  Normal affect   ASSESSMENT:    1. Fatigue, unspecified type   2. Pure hypercholesterolemia   3. Essential hypertension    PLAN:    In order of problems listed above:  1. Patient with 70-month history of generalized fatigue.  She denies any cardiac symptoms of palpitations, chest pain, shortness of breath, at rest or with exertion.  Her symptoms of generalized fatigue could be secondary to vitamin deficiency as she is to be on vitamin B12 she stopped taking about 10 months ago.  She will follow-up with PCP regarding this.  Although she has risk factors of heart disease such as hypertension and hyperlipidemia, family history of CAD, the patient herself has no cardiac symptoms.  As such, testing is not indicated nor recommended.  If patient develops cardiac symptoms, we will consider further cardiac testing.  At the moment, secondary prevention with management of risk factors such as hypertension and hyperlipidemia is appropriate. 2. History of hyperlipidemia.  10-year ASCVD risk score 22.8%.  Moderate to high intensity statin recommended.  I recommend increase of Pravachol to 40 mg daily.  Patient would like to stay on 20 mg daily, follow-up with primary care provider regarding increased dose.. 3. History of hypertension,  blood pressure is reasonably controlled.  Continue current BP meds.  Follow-up as needed  This note was generated in part or whole with voice recognition software. Voice recognition is usually quite accurate but there are transcription errors that can and very often do occur. I apologize for any typographical errors that were not detected and corrected.  Medication Adjustments/Labs and Tests Ordered: Current medicines are reviewed at length with the patient today.  Concerns regarding medicines are outlined above.  Orders Placed This Encounter  Procedures  . EKG 12-Lead   No orders of the defined types were placed in this encounter.   Patient Instructions  Medication Instructions:  Your physician recommends that you continue on your current medications as directed. Please refer to the Current Medication list given to you today.  *If you need a refill on your cardiac medications before your next appointment, please call your pharmacy*  Lab Work: none If you have labs (blood work) drawn today and your tests are completely normal, you will receive your results only by: Marland Kitchen MyChart Message (if you have MyChart) OR . A paper copy in the mail If you have any lab test that is abnormal or we need to change your treatment, we will call you to review the results.  Testing/Procedures: none  Follow-Up: At [facility], you and your health needs are our priority.  As part of our continuing mission to provide you with exceptional heart care, we have created designated Provider Care Teams.  These Care Teams include your primary Cardiologist (physician) and Advanced Practice Providers (APPs -  Physician Assistants and Nurse Practitioners) who all work together to provide you with the care you need, when you need it.  We recommend signing up for the patient portal called "MyChart".  Sign up information is provided on this After Visit Summary.  MyChart is used to connect with patients for Virtual  Visits (Telemedicine).  Patients are able to view lab/test results, encounter notes, upcoming appointments, etc.  Non-urgent messages can be sent to your provider as well.   To learn more about what you can do with MyChart, go to [email or web address].    Your  next appointment:   As needed.   The format for your next appointment:   In Person  Provider:   Debbe Odea, MD     Signed, Debbe Odea, MD  02/03/2020 10:35 AM    [facility]

## 2020-02-05 ENCOUNTER — Other Ambulatory Visit: Payer: Self-pay

## 2020-02-05 ENCOUNTER — Ambulatory Visit (INDEPENDENT_AMBULATORY_CARE_PROVIDER_SITE_OTHER): Payer: Medicare PPO

## 2020-02-05 DIAGNOSIS — E538 Deficiency of other specified B group vitamins: Secondary | ICD-10-CM

## 2020-02-05 MED ORDER — CYANOCOBALAMIN 1000 MCG/ML IJ SOLN
1000.0000 ug | Freq: Once | INTRAMUSCULAR | Status: AC
  Administered 2020-02-05: 1000 ug via INTRAMUSCULAR

## 2020-02-05 NOTE — Progress Notes (Addendum)
Patient presented for B 12 injection to right deltoid, patient voiced no concerns nor showed any signs of distress during injection.  Reviewed.  Dr [name] 

## 2020-02-26 ENCOUNTER — Other Ambulatory Visit: Payer: Self-pay | Admitting: Internal Medicine

## 2020-03-01 ENCOUNTER — Other Ambulatory Visit: Payer: Self-pay

## 2020-03-01 ENCOUNTER — Ambulatory Visit
Admission: EM | Admit: 2020-03-01 | Discharge: 2020-03-01 | Disposition: A | Discharge status: Home / Self Care | Payer: Medicare PPO | Attending: Urgent Care | Admitting: Urgent Care

## 2020-03-01 DIAGNOSIS — R1084 Generalized abdominal pain: Secondary | ICD-10-CM | POA: Diagnosis not present

## 2020-03-01 DIAGNOSIS — R102 Pelvic and perineal pain: Secondary | ICD-10-CM | POA: Diagnosis not present

## 2020-03-01 DIAGNOSIS — K5900 Constipation, unspecified: Secondary | ICD-10-CM

## 2020-03-01 LAB — POCT URINALYSIS DIP (MANUAL ENTRY)
Bilirubin, UA: NEGATIVE
Blood, UA: NEGATIVE
Glucose, UA: NEGATIVE mg/dL
Ketones, POC UA: NEGATIVE mg/dL
Leukocytes, UA: NEGATIVE
Nitrite, UA: NEGATIVE
Protein Ur, POC: NEGATIVE mg/dL
Spec Grav, UA: 1.025 (ref 1.010–1.025)
Urobilinogen, UA: 1 E.U./dL
pH, UA: 7 (ref 5.0–8.0)

## 2020-03-01 MED ORDER — DOCUSATE SODIUM 100 MG PO CAPS: 100.0000 mg | ORAL_CAPSULE | Freq: Two times a day (BID) | ORAL | 0 refills | Status: AC

## 2020-03-01 NOTE — ED Triage Notes (Signed)
Patient in today c/o right sided flank pain, urinary pressure and malodorous urine x 3 weeks. Patient has been increasing her water intake and drinking cranberry juice without relief. Patient denies fever.

## 2020-03-01 NOTE — ED Provider Notes (Signed)
Renae Gloss   MRN: [id number] DOB: [birth year]  Subjective:   Lisa Robles is a 74 y.o. female presenting for 3-week history of persistent pelvic pressure, urinary pressure and malodorous urine.  Patient has history of chronic and recurrent UTIs.  Last year she was recommended a consult with a urologist which she went to.  She states that they prescribed Estrace and thought she had a yeast infection.  She is not interested in seeing a urologist again.  Denies fever, nausea, vomiting, dysuria, urinary frequency.  On further questioning, patient also states that she has recurrent constipation.  Has a longstanding history of this but admits that she has not been eating very healthily lately and knows that she has had a lot of constipating foods.  No current facility-administered medications for this encounter.  Current Outpatient Medications:  .  albuterol (VENTOLIN HFA) 108 (90 Base) MCG/ACT inhaler, Inhale 2 puffs into the lungs every 6 (six) hours as needed., Disp: 18 g, Rfl: 2 .  aspirin 81 MG tablet, Take 81 mg by mouth daily., Disp: , Rfl:  .  benzonatate (TESSALON) 100 MG capsule, Take 1 capsule (100 mg total) by mouth 3 (three) times daily as needed for cough., Disp: 21 capsule, Rfl: 0 .  conjugated estrogens (PREMARIN) vaginal cream, Place 1 Applicatorful vaginally daily. Use pea sized amount M-W-Fr before bedtime, Disp: 42.5 g, Rfl: 12 .  fluticasone (FLONASE) 50 MCG/ACT nasal spray, Place 2 sprays into both nostrils daily., Disp: 16 g, Rfl: 1 .  guaiFENesin (MUCINEX) 600 MG 12 hr tablet, Take 2 tablets (1,200 mg total) by mouth 2 (two) times daily as needed for cough or to loosen phlegm., Disp: 20 tablet, Rfl: 0 .  hydrochlorothiazide (HYDRODIURIL) 25 MG tablet, TAKE 1 TABLET(25 MG) BY MOUTH DAILY, Disp: 90 tablet, Rfl: 1 .  loratadine (CLARITIN) 10 MG tablet, Take 10 mg by mouth daily., Disp: , Rfl:  .  meclizine (ANTIVERT) 25 MG tablet, Take 1 tablet (25 mg total) by  mouth 3 (three) times daily as needed for dizziness., Disp: 30 tablet, Rfl: 0 .  nystatin cream (MYCOSTATIN), Apply 1 application topically 2 (two) times daily., Disp: 60 g, Rfl: 0 .  pantoprazole (PROTONIX) 40 MG tablet, TAKE 1 TABLET(40 MG) BY MOUTH DAILY, Disp: 30 tablet, Rfl: 2 .  pravastatin (PRAVACHOL) 20 MG tablet, Take 1 tablet (20 mg total) by mouth daily., Disp: 30 tablet, Rfl: 2 .  WIXELA INHUB 100-50 MCG/DOSE AEPB, INHALE 1 PUFF BY MOUTH TWICE DAILY, Disp: 60 each, Rfl: 2   Allergies  Allergen Reactions  . Cinnamon Other (See Comments)    Mouth irritant   . Bactrim [Sulfamethoxazole-Trimethoprim] Rash  . Shellfish Allergy Rash    Past Medical History:  Diagnosis Date  . GERD (gastroesophageal reflux disease)   . Hypercholesteremia   . Hypertension      Past Surgical History:  Procedure Laterality Date  . BREAST BIOPSY    . TOOTH EXTRACTION     tooth implantation  . TUBAL LIGATION  1979    Family History  Problem Relation Age of Onset  . Heart attack Mother   . Heart attack Father   . Hypercholesterolemia Brother   . Cancer Maternal Aunt        breast  . Colon cancer Neg Hx   . Breast cancer Neg Hx     Social History   Tobacco Use  . Smoking status: Former Smoker    Quit date: 12/04/1977    Years  since quitting: 42.2  . Smokeless tobacco: Never Used  Substance Use Topics  . Alcohol use: No    Alcohol/week: 0.0 standard drinks  . Drug use: No    ROS   Objective:   Vitals: BP 124/88 (BP Location: Left Arm)   Pulse 80   Temp 98.1 F (36.7 C) (Oral)   Resp 18   Ht 5\' 3"  (1.6 m)   Wt 172 lb (78 kg)   SpO2 96%   BMI 30.47 kg/m   Physical Exam Constitutional:      General: She is not in acute distress.    Appearance: Normal appearance. She is well-developed. She is not ill-appearing, toxic-appearing or diaphoretic.  HENT:     Head: Normocephalic and atraumatic.     Nose: Nose normal.     Mouth/Throat:     Mouth: Mucous membranes are  moist.     Pharynx: Oropharynx is clear.  Eyes:     General: No scleral icterus.    Extraocular Movements: Extraocular movements intact.     Pupils: Pupils are equal, round, and reactive to light.  Cardiovascular:     Rate and Rhythm: Normal rate.  Pulmonary:     Effort: Pulmonary effort is normal.  Abdominal:     General: There is no distension.     Palpations: There is no mass.     Tenderness: There is no abdominal tenderness. There is no right CVA tenderness, left CVA tenderness, guarding or rebound.  Skin:    General: Skin is warm and dry.  Neurological:     General: No focal deficit present.     Mental Status: She is alert and oriented to person, place, and time.  Psychiatric:        Mood and Affect: Mood normal.        Behavior: Behavior normal.        Thought Content: Thought content normal.        Judgment: Judgment normal.     Results for orders placed or performed during the hospital encounter of 03/01/20 (from the past 24 hour(s))  POCT urinalysis dipstick     Status: Normal   Collection Time: 03/01/20  2:27 PM  Result Value Ref Range   Color, UA yellow yellow   Clarity, UA clear clear   Glucose, UA negative negative mg/dL   Bilirubin, UA negative negative   Ketones, POC UA negative negative mg/dL   Spec Grav, UA 03/03/20 1.017 - 1.025   Blood, UA negative negative   pH, UA 7.0 5.0 - 8.0   Protein Ur, POC negative negative mg/dL   Urobilinogen, UA 1.0 0.2 or 1.0 E.U./dL   Nitrite, UA Negative Negative   Leukocytes, UA Negative Negative    Assessment and Plan :   1. Generalized abdominal pain   2. Pelvic pressure in female   3. Constipation, unspecified constipation type     Urine culture is pending, low suspicion for recurrent UTI.  Had discussion with patient about hydrating very well with plain water, limiting juices including cranberry juice.  Also discussed daily management of constipation and encourage dietary modifications.  Start docusate to help  with this.  Patient is not interested in following up with urologist.  Recommended follow-up with her PCP.  Counseled patient on potential for adverse effects with medications prescribed/recommended today, ER and return-to-clinic precautions discussed, patient verbalized understanding.    5.102, Wallis Bamberg 03/01/20 1548

## 2020-03-01 NOTE — Discharge Instructions (Addendum)
We will let you know through my chart about your urine culture results. For now hold off on drinking cranberry juice and hydrate with plain water. I do suspect that constipation is causing some of your pelvic pressure. However, I recommend checking back with your PCP as you have had trouble with urinary tract infections before. If you develop fever, painful urination, worsening belly pain, nausea, vomiting and please come back for recheck. Otherwise we will base further treatment on your urine culture results.   For moderate to severe constipation (not having a bowel movement in more than 3 days) then try to use Miralax or an enema once daily until you have a good bowel movement.  It is not a good idea to use laxatives regularly, for instance daily.  A medication you could use daily to help with promoting bowel movements is docusate (Colace) 50mg -100mg . It is okay to use this 1-2 times daily as needed as a stool softener.  Try to stay active physically including regular exercise 2-3 times a week.  Make sure you hydrate well every day with about 64 ounces of water daily (that is 2 liters).  Try to avoid carb heavy foods, dairy. This includes cutting out breads, pasta, pizza, pastries, potatoes, rice, starchy foods in general. Eat more fiber as listed below:  Salads - kale, spinach, cabbage, spring mix; use seeds like pumpkin seeds or sunflower seeds, almonds; you can also use 1-2 hard boiled eggs in your salads Fruits - avocadoes, berries (blueberries, raspberries, blackberries), apples, oranges, pomegranate, grapefruit Vegetables - aspargus, cauliflower, broccoli, green beans, brussel spouts, bell peppers; stay away from starchy vegetables like potatoes, carrots, peas  Do not eat any foods on this list that you are allergic to.

## 2020-03-02 ENCOUNTER — Encounter: Payer: Self-pay | Admitting: *Deleted

## 2020-03-02 ENCOUNTER — Encounter: Payer: Medicare PPO | Attending: Internal Medicine | Admitting: *Deleted

## 2020-03-02 VITALS — BP 128/84 | Ht 63.0 in | Wt 183.4 lb

## 2020-03-02 DIAGNOSIS — E119 Type 2 diabetes mellitus without complications: Secondary | ICD-10-CM | POA: Insufficient documentation

## 2020-03-02 DIAGNOSIS — Z713 Dietary counseling and surveillance: Secondary | ICD-10-CM | POA: Diagnosis not present

## 2020-03-02 LAB — URINE CULTURE: Culture: <10000 — AB

## 2020-03-02 NOTE — Patient Instructions (Signed)
Eat 3 meals day,  1-2 snacks a day Space meals 4-6 hours apart Avoid sugar sweetened drinks (juices) Complete 3 Day Food Record and bring to next appt  Exercise: Continue walking  for  20  minutes  3 days a week and increase as tolerated  Return for appointment on:  Wednesday March 24, 2020 at 10:30 am with [facility] (dietitian)

## 2020-03-02 NOTE — Progress Notes (Signed)
Diabetes Self-Management Education  Visit Type: First/Initial  Appt. Start Time: 1335 Appt. End Time: 1440  03/02/2020  [name], identified by name and date of birth, is a 74 y.o. female with a diagnosis of Diabetes: Type 2.   ASSESSMENT  Blood pressure 128/84, height 5\' 3"  (1.6 m), weight 183 lb 6.4 oz (83.2 kg). Body mass index is 32.49 kg/m.  Diabetes Self-Management Education - 03/02/20 1502      Visit Information   Visit Type  First/Initial      Initial Visit   Diabetes Type  Type 2    Are you currently following a meal plan?  Yes    What type of meal plan do you follow?  "cut out bread, potatoes, bacon, fast food"    Are you taking your medications as prescribed?  No   some medications she doesn't take on a regular basis   Date Diagnosed  Feb 2021      Health Coping   How would you rate your overall health?  Good      Psychosocial Assessment   Patient Belief/Attitude about Diabetes  Afraid   "scary"   Self-care barriers  None    Self-management support  Doctor's office;Family    Patient Concerns  Nutrition/Meal planning;Glycemic Control;Weight Control;Healthy Lifestyle    Special Needs  None    Preferred Learning Style  Hands on    Learning Readiness  Change in progress    How often do you need to have someone help you when you read instructions, pamphlets, or other written materials from your doctor or pharmacy?  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  Needs Instruction    Patient understands incorporating nutritional management into lifestyle.  Needs Instruction    Patient undertands incorporating physical activity into lifestyle.  Needs Instruction    Patient understands using medications safely.  Needs Instruction    Patient understands monitoring blood glucose, interpreting and using results  Needs Instruction     Patient understands prevention, detection, and treatment of acute complications.  Needs Instruction    Patient understands prevention, detection, and treatment of chronic complications.  Needs Instruction    Patient understands how to develop strategies to address psychosocial issues.  Needs Instruction    Patient understands how to develop strategies to promote health/change behavior.  Needs Instruction      Complications   Last HgB A1C per patient/outside source  6.5 %   01/20/2020   How often do you check your blood sugar?  Patient declines   BG in the office today was 110 mg/dL at 01/22/2020 pm - 2 1/2 hrs pp.   Have you had a dilated eye exam in the past 12 months?  Yes    Have you had a dental exam in the past 12 months?  Yes    Are you checking your feet?  Yes    How many days per week are you checking your feet?  6      Dietary Intake   Breakfast  oatmeal, eggs, wheat toast, cereal and milk; 4:33 bacon on week-ends    Lunch  salad with tuna from [place] or [employer]; [place]; chicken; [employer], beef, fish- peas, beans, cauliflower, rice, couscous, quinoa, green beans, lettuce, carrots, cukes, celery, broccoli, squash, greens  Snack (evening)  crackers and cheese or peanut butter, hummus, apple and peanut butter    Beverage(s)  water, milk, coffee, juice      Exercise   Exercise Type  Light (walking / raking leaves)    How many days per week to you exercise?  3    How many minutes per day do you exercise?  20    Total minutes per week of exercise  60      Patient Education   Previous Diabetes Education  No    Disease state   Definition of diabetes, type 1 and 2, and the diagnosis of diabetes;Factors that contribute to the development of diabetes    Nutrition management   Role of diet in the treatment of diabetes and the relationship between the three main macronutrients and blood glucose level;Food label reading, portion sizes and measuring food.;Reviewed  blood glucose goals for pre and post meals and how to evaluate the patients' food intake on their blood glucose level.    Physical activity and exercise   Role of exercise on diabetes management, blood pressure control and cardiac health.    Monitoring  Identified appropriate SMBG and/or A1C goals.    Chronic complications  Relationship between chronic complications and blood glucose control    Psychosocial adjustment  Identified and addressed patients feelings and concerns about diabetes      Individualized Goals (developed by patient)   Reducing Risk  Other (comment)   improve blood sugars, lose weight, lead a healthier lifestyle, become more fi     Outcomes   Expected Outcomes  Demonstrated interest in learning. Expect positive outcomes    Future DMSE  2 wks       Individualized Plan for Diabetes Self-Management Training:   Learning Objective:  Patient will have a greater understanding of diabetes self-management. Patient education plan is to attend individual and/or group sessions per assessed needs and concerns.   Plan:   Patient Instructions  Eat 3 meals day,  1-2 snacks a day Space meals 4-6 hours apart Avoid sugar sweetened drinks (juices) Complete 3 Day Food Record and bring to next appt Exercise: Continue walking  for  20  minutes  3 days a week and increase as tolerated Return for appointment on:  Wednesday March 24, 2020 at 10:30 am with [facility] (dietitian)  Expected Outcomes:  Demonstrated interest in learning. Expect positive outcomes  Education material provided:  General Meal Planning Guidelines Simple Meal Plan 3 Day Food Record  If problems or questions, patient to contact team via:  Johny Drilling, RN, [place] [phone]  Future DSME appointment: 2 wks  March 24, 2020 with the dietitian

## 2020-03-05 ENCOUNTER — Ambulatory Visit: Payer: Medicare Other

## 2020-03-08 ENCOUNTER — Ambulatory Visit (INDEPENDENT_AMBULATORY_CARE_PROVIDER_SITE_OTHER): Payer: Medicare PPO

## 2020-03-08 ENCOUNTER — Other Ambulatory Visit: Payer: Self-pay

## 2020-03-08 ENCOUNTER — Telehealth: Payer: Self-pay | Admitting: Internal Medicine

## 2020-03-08 VITALS — BP 142/91 | HR 64 | Ht 63.0 in | Wt 183.0 lb

## 2020-03-08 DIAGNOSIS — Z0000 Encounter for general adult medical examination without abnormal findings: Secondary | ICD-10-CM

## 2020-03-08 NOTE — Progress Notes (Addendum)
Subjective:   Lisa Robles is a 74 y.o. female who presents for Medicare Annual (Subsequent) preventive examination.  Review of Systems:  No ROS.  Medicare Wellness Virtual Visit.  Visual/audio telehealth visit. Vital signs provided by patient.    See social history for additional risk factors.  Cardiac Risk Factors include: advanced age (>27men, >61 women);diabetes mellitus;hypertension     Objective:     Vitals: BP (!) 142/91 (BP Location: Left Arm, Cuff Size: Normal)   Pulse 64   Ht 5\' 3"  (1.6 m)   Wt 183 lb (83 kg)   BMI 32.42 kg/m   Body mass index is 32.42 kg/m.   Patient monitors BP at home. Taken L and R arm today. Denies headache, chest pain, dizziness and all other symptoms   Advanced Directives 03/08/2020 03/02/2020 03/05/2019 12/02/2018 01/21/2018 01/18/2017 06/30/2016  Does Patient Have a Medical Advance Directive? Yes Yes No No No No No  Type of Paramedic of [place];Living will [place];Living will - - - - -  Does patient want to make changes to medical advance directive? - No - Patient declined - - - - -  Copy of [place] in Chart? No - copy requested - - - - - -  Would patient like information on creating a medical advance directive? - - Yes (MAU/Ambulatory/Procedural Areas - Information given) - Yes (MAU/Ambulatory/Procedural Areas - Information given) No - Patient declined -    Tobacco Social History   Tobacco Use  Smoking Status Former Smoker  . Packs/day: 1.00  . Years: 7.00  . Pack years: 7.00  . Types: Cigarettes  . Quit date: 12/04/1977  . Years since quitting: 42.2  Smokeless Tobacco Never Used     Counseling given: Not Answered   Clinical Intake:  Pre-visit preparation completed: Yes           How often do you need to have someone help you when you read instructions, pamphlets, or other written materials from your doctor or pharmacy?: 1 - Never  Interpreter Needed?:  No     Past Medical History:  Diagnosis Date  . Diabetes mellitus without complication ([place])   . GERD (gastroesophageal reflux disease)   . Hypercholesteremia   . Hypertension    Past Surgical History:  Procedure Laterality Date  . BREAST BIOPSY    . TOOTH EXTRACTION     tooth implantation  . TUBAL LIGATION  1979   Family History  Problem Relation Age of Onset  . Heart attack Mother   . Heart attack Father   . Hypercholesterolemia Brother   . Diabetes Brother   . Cancer Maternal Aunt        breast  . Diabetes Maternal Grandmother   . Colon cancer Neg Hx   . Breast cancer Neg Hx    Social History   Socioeconomic History  . Marital status: Widowed    Spouse name: Not on file  . Number of children: 2  . Years of education: masters  . Highest education level: Not on file  Occupational History  . Occupation: retired Pharmacist, hospital  Tobacco Use  . Smoking status: Former Smoker    Packs/day: 1.00    Years: 7.00    Pack years: 7.00    Types: Cigarettes    Quit date: 12/04/1977    Years since quitting: 42.2  . Smokeless tobacco: Never Used  Substance and Sexual Activity  . Alcohol use: No    Alcohol/week: 0.0  standard drinks  . Drug use: No  . Sexual activity: Yes    Birth control/protection: None    Comment: Is sexually active wih same partner x 14 years. Recently found out partner has been cheating.  Other Topics Concern  . Not on file  Social History Narrative  . Not on file   Social Determinants of Health   Financial Resource Strain: Low Risk   . Difficulty of Paying Living Expenses: Not hard at all  Food Insecurity: No Food Insecurity  . Worried About Programme researcher, broadcasting/film/video in the Last Year: Never true  . Ran Out of Food in the Last Year: Never true  Transportation Needs:   . Lack of Transportation (Medical):   Marland Kitchen Lack of Transportation (Non-Medical):   Physical Activity: Insufficiently Active  . Days of Exercise per Week: 7 days  . Minutes of Exercise per  Session: 20 min  Stress: No Stress Concern Present  . Feeling of Stress : Not at all  Social Connections: Unknown  . Frequency of Communication with Friends and Family: More than three times a week  . Frequency of Social Gatherings with Friends and Family: More than three times a week  . Attends Religious Services: Not on file  . Active Member of Clubs or Organizations: Yes  . Attends Banker Meetings: Not on file  . Marital Status: Widowed    Outpatient Encounter Medications as of 03/08/2020  Medication Sig  . albuterol (VENTOLIN HFA) 108 (90 Base) MCG/ACT inhaler Inhale 2 puffs into the lungs every 6 (six) hours as needed.  Marland Kitchen aspirin 81 MG tablet Take 81 mg by mouth daily.  . benzonatate (TESSALON) 100 MG capsule Take 1 capsule (100 mg total) by mouth 3 (three) times daily as needed for cough. (Patient not taking: Reported on 03/02/2020)  . Clotrimazole (LOTRIMIN AF EX) Apply 1 application topically daily as needed. prn   . conjugated estrogens (PREMARIN) vaginal cream Place 1 Applicatorful vaginally daily. Use pea sized amount M-W-Fr before bedtime (Patient taking differently: Place 1 Applicatorful vaginally daily as needed. Use pea sized amount M-W-Fr before bedtime)  . docusate sodium (COLACE) 100 MG capsule Take 1 capsule (100 mg total) by mouth every 12 (twelve) hours. (Patient not taking: Reported on 03/02/2020)  . fluticasone (FLONASE) 50 MCG/ACT nasal spray Place 2 sprays into both nostrils daily. (Patient not taking: Reported on 03/02/2020)  . guaiFENesin (MUCINEX) 600 MG 12 hr tablet Take 2 tablets (1,200 mg total) by mouth 2 (two) times daily as needed for cough or to loosen phlegm.  . hydrochlorothiazide (HYDRODIURIL) 25 MG tablet TAKE 1 TABLET(25 MG) BY MOUTH DAILY  . loratadine (CLARITIN) 10 MG tablet Take 10 mg by mouth daily.  . meclizine (ANTIVERT) 25 MG tablet Take 1 tablet (25 mg total) by mouth 3 (three) times daily as needed for dizziness. (Patient not taking:  Reported on 03/02/2020)  . nystatin cream (MYCOSTATIN) Apply 1 application topically 2 (two) times daily. (Patient not taking: Reported on 03/02/2020)  . pantoprazole (PROTONIX) 40 MG tablet TAKE 1 TABLET(40 MG) BY MOUTH DAILY  . pravastatin (PRAVACHOL) 20 MG tablet Take 1 tablet (20 mg total) by mouth daily.  [place] INHUB 100-50 MCG/DOSE AEPB INHALE 1 PUFF BY MOUTH TWICE DAILY (Patient not taking: Reported on 03/02/2020)   No facility-administered encounter medications on file as of 03/08/2020.    Activities of Daily Living In your present state of health, do you have any difficulty performing the following activities: 03/08/2020  Hearing? N  Vision? N  Difficulty concentrating or making decisions? N  Walking or climbing stairs? N  Dressing or bathing? N  Doing errands, shopping? N  Preparing Food and eating ? N  Using the Toilet? N  In the past six months, have you accidently leaked urine? N  Do you have problems with loss of bowel control? N  Managing your Medications? N  Managing your Finances? N  Housekeeping or managing your Housekeeping? N  Some recent data might be hidden    Patient Care Team: Dale Walden, MD as PCP - General (Internal Medicine) Debbe Odea, MD as PCP - Cardiology (Cardiology)    Assessment:   This is a routine wellness examination for Vala.  Nurse connected with patient 03/08/20 at  1:30 PM EDT by a telephone enabled telemedicine application and verified that I am speaking with the correct person using two identifiers. Patient stated full name and DOB. Patient gave permission to continue with virtual visit. Patient's location was at home and Nurse's location was at [place] office.   Patient is alert and oriented x3. Patient denies difficulty focusing or concentrating. Patient likes to have recall discussions with daughter and socializes with friends for brain health.  Health Maintenance Due: -Foot exam- denies changes; followed by  -Urine  Microalbumin- followed by pcp; cpe 05/14/20 -Urine Culture- 03/01/20 -Hgb A1c- 01/20/20 (6.5) See completed HM at the end of note.   Eye: Visual acuity not assessed. Virtual visit. Followed by their ophthalmologist. Retinopathy- none reported.  Dental: Visits orthodondist every 3 months, visits periodontist every 3 months.    Hearing: Demonstrates normal hearing during visit.  Safety:  Patient feels safe at home- yes Patient does have smoke detectors at home- yes Patient does wear sunscreen or protective clothing when in direct sunlight - yes Patient does wear seat belt when in a moving vehicle - yes Patient drives- yes Adequate lighting in walkways free from debris- yes Grab bars and handrails used as appropriate- yes Ambulates with an assistive device- no Cell phone on person when ambulating outside of the home- yes  Social: Alcohol intake - no   Smoking history- former Smokers in home? none Illicit drug use? none  Medication: Taking as directed and without issues.  Pill box in use -yes  Self managed - yes   Covid-19: Precautions and sickness symptoms discussed. Wears mask, social distancing, hand hygiene as appropriate.   Activities of Daily Living Patient denies needing assistance with: household chores, feeding themselves, getting from bed to chair, getting to the toilet, bathing/showering, dressing, managing money, or preparing meals.   Discussed the importance of a healthy diet, water intake and the benefits of aerobic exercise.   Physical activity- walking, 20 minutes  Diet:  Dietician directed: no fast food, minimal bread intake, high protein Water: fair intake, 4-6 cups Caffeine: 1/2 cup of coffee, black  Other Providers Patient Care Team: Dale , MD as PCP - General (Internal Medicine) Debbe Odea, MD as PCP - Cardiology (Cardiology)  Exercise Activities and Dietary recommendations Current Exercise Habits: Home exercise routine, Type of  exercise: walking, Time (Minutes): 20, Intensity: Mild  Goals    . Take cholesterol medication as prescribed within the year .       Fall Risk Fall Risk  03/08/2020 03/02/2020 03/05/2019 04/12/2018 01/21/2018  Falls in the past year? 0 0 1 No No  Number falls in past yr: - - 0 - -  Comment - - Last 11/2018 - -  Follow up  Falls evaluation completed;Falls prevention discussed - - - -   Timed Get Up and Go performed: no, virtual visit  Depression Screen PHQ 2/9 Scores 03/08/2020 03/02/2020 03/05/2019 04/12/2018  PHQ - 2 Score 0 0 0 0  PHQ- 9 Score - - - -     Cognitive Function MMSE - Mini Mental State Exam 01/18/2017 01/19/2016  Orientation to time 5 5  Orientation to Place 5 5  Registration 3 3  Attention/ Calculation 5 5  Recall 3 3  Language- name 2 objects 2 2  Language- repeat 1 1  Language- follow 3 step command 3 3  Language- read & follow direction 1 1  Write a sentence 1 1  Copy design 1 1  Total score 30 30     6CIT Screen 03/08/2020 03/05/2019 01/21/2018  What Year? 0 points 0 points 0 points  What month? 0 points 0 points 0 points  What time? 0 points 0 points 0 points  Count back from 20 0 points 0 points 0 points  Months in reverse 0 points 0 points 0 points  Repeat phrase 0 points 0 points 0 points  Total Score 0 0 0    Immunization History  Administered Date(s) Administered  . Fluad Quad(high Dose 65+) 08/28/2019  . Influenza, High Dose Seasonal PF 08/17/2016, 08/22/2017, 08/28/2018  . Influenza,inj,Quad PF,6+ Mos 07/29/2014  . Influenza-Unspecified 09/01/2013, 09/09/2015  . PFIZER SARS-COV-2 Vaccination 12/10/2019, 12/31/2019  . Pneumococcal Conjugate-13 12/01/2015  . Pneumococcal Polysaccharide-23 07/18/2011, 06/28/2017  . Tdap 05/19/2011  . Zoster Recombinat (Shingrix) 06/28/2017   Screening Tests Health Maintenance  Topic Date Due  . FOOT EXAM  Never done  . OPHTHALMOLOGY EXAM  Never done  . URINE MICROALBUMIN  12/11/2014  . INFLUENZA VACCINE   07/04/2020  . HEMOGLOBIN A1C  07/19/2020  . TETANUS/TDAP  05/18/2021  . COLONOSCOPY  09/03/2021  . DEXA SCAN  Completed  . Hepatitis C Screening  Completed  . PNA vac Low Risk Adult  Completed      Plan:   Keep all routine maintenance appointments.   Next Nurse Visit B12 injection 03/11/20 @ 11:00  Non-fasting lab- 03/11/20 @ 11:30  Fasting lab 05/12/20   Cpe 05/14/20 @ 1:30. Requests sleep study for snoring and fatigue.   Dermatology appointment 09/2020  Medicare Attestation I have personally reviewed: The patient's medical and social history Their use of alcohol, tobacco or illicit drugs Their current medications and supplements The patient's functional ability including ADLs,fall risks, home safety risks, cognitive, and hearing and visual impairment Diet and physical activities Evidence for depression   I have reviewed and discussed with patient certain preventive protocols, quality metrics, and best practice recommendations.      Ashok Pall, LPN  06/04/946   Reviewed information.  Agree with assessment and plan.  Will schedule appt to discuss scheduling a sleep study.    Dr Lorin Picket

## 2020-03-08 NOTE — Patient Instructions (Addendum)
  Ms. Ludwick , Thank you for taking time to come for your Medicare Wellness Visit. I appreciate your ongoing commitment to your health goals. Please review the following plan we discussed and let me know if I can assist you in the future.   These are the goals we discussed: Goals    . Take cholesterol medication as prescribed within the year .       This is a list of the screening recommended for you and due dates:  Health Maintenance  Topic Date Due  . Complete foot exam   Never done  . Eye exam for diabetics  Never done  . Urine Protein Check  12/11/2014  . Flu Shot  07/04/2020  . Hemoglobin A1C  07/19/2020  . Tetanus Vaccine  05/18/2021  . Colon Cancer Screening  09/03/2021  . DEXA scan (bone density measurement)  Completed  .  Hepatitis C: One time screening is recommended by Center for Disease Control  (CDC) for  adults born from [birth year] through 1967.   Completed  . Pneumonia vaccines  Completed

## 2020-03-08 NOTE — Telephone Encounter (Signed)
I reviewed your note on Lisa Robles.  States requesting sleep study.  I am ok to schedule appt to discuss symptoms and see about scheduling a sleep study.  Thanks.

## 2020-03-09 NOTE — Telephone Encounter (Signed)
Patient declined sooner appointment than cpe scheduled in June. States she was hoping to discuss at this time, however will schedule at a later date as needed.

## 2020-03-09 NOTE — Telephone Encounter (Signed)
Only symptom is snoring. ENT did a sleep study in the past and stated she did not have sleep apnea because her snoring did not interfere with her breathing. Patient stated that it is nothing urgent and we can discuss referral at her appt in June. Offered to schedule earlier appt- pt stated was not needed and she would let me know if she changes her mind.

## 2020-03-09 NOTE — Telephone Encounter (Signed)
Please call pt and confirm symptoms currently having.  Was she wanting a referral now?  Please explain the way we assess for sleep apnea - referring to pulmonary and then they can set and read study (home study, etc).  Let me know if needs me to do anything prior to her appt.

## 2020-03-11 ENCOUNTER — Other Ambulatory Visit: Payer: Self-pay

## 2020-03-11 ENCOUNTER — Ambulatory Visit (INDEPENDENT_AMBULATORY_CARE_PROVIDER_SITE_OTHER): Payer: Medicare PPO

## 2020-03-11 ENCOUNTER — Other Ambulatory Visit (INDEPENDENT_AMBULATORY_CARE_PROVIDER_SITE_OTHER): Payer: Medicare PPO

## 2020-03-11 DIAGNOSIS — E7800 Pure hypercholesterolemia, unspecified: Secondary | ICD-10-CM | POA: Diagnosis not present

## 2020-03-11 DIAGNOSIS — E538 Deficiency of other specified B group vitamins: Secondary | ICD-10-CM | POA: Diagnosis not present

## 2020-03-11 LAB — HEPATIC FUNCTION PANEL
ALT: 12 U/L (ref 0–35)
AST: 17 U/L (ref 0–37)
Albumin: 4 g/dL (ref 3.5–5.2)
Alkaline Phosphatase: 72 U/L (ref 39–117)
Bilirubin, Direct: 0.1 mg/dL (ref 0.0–0.3)
Total Bilirubin: 0.5 mg/dL (ref 0.2–1.2)
Total Protein: 7.4 g/dL (ref 6.0–8.3)

## 2020-03-11 MED ORDER — CYANOCOBALAMIN 1000 MCG/ML IJ SOLN
1000.0000 ug | Freq: Once | INTRAMUSCULAR | Status: AC
  Administered 2020-03-11: 1000 ug via INTRAMUSCULAR

## 2020-03-11 NOTE — Progress Notes (Addendum)
Patient presented for B 12 injection to left deltoid, patient voiced no concerns nor showed any signs of distress during injection.  Reviewed.  Dr [name] 

## 2020-03-15 DIAGNOSIS — R32 Unspecified urinary incontinence: Secondary | ICD-10-CM | POA: Diagnosis not present

## 2020-03-15 DIAGNOSIS — K219 Gastro-esophageal reflux disease without esophagitis: Secondary | ICD-10-CM | POA: Diagnosis not present

## 2020-03-15 DIAGNOSIS — Z833 Family history of diabetes mellitus: Secondary | ICD-10-CM | POA: Diagnosis not present

## 2020-03-15 DIAGNOSIS — E669 Obesity, unspecified: Secondary | ICD-10-CM | POA: Diagnosis not present

## 2020-03-15 DIAGNOSIS — E785 Hyperlipidemia, unspecified: Secondary | ICD-10-CM | POA: Diagnosis not present

## 2020-03-15 DIAGNOSIS — J45909 Unspecified asthma, uncomplicated: Secondary | ICD-10-CM | POA: Diagnosis not present

## 2020-03-15 DIAGNOSIS — Z823 Family history of stroke: Secondary | ICD-10-CM | POA: Diagnosis not present

## 2020-03-15 DIAGNOSIS — Z6832 Body mass index (BMI) 32.0-32.9, adult: Secondary | ICD-10-CM | POA: Diagnosis not present

## 2020-03-15 DIAGNOSIS — Z8249 Family history of ischemic heart disease and other diseases of the circulatory system: Secondary | ICD-10-CM | POA: Diagnosis not present

## 2020-03-15 DIAGNOSIS — Z87891 Personal history of nicotine dependence: Secondary | ICD-10-CM | POA: Diagnosis not present

## 2020-03-15 DIAGNOSIS — I10 Essential (primary) hypertension: Secondary | ICD-10-CM | POA: Diagnosis not present

## 2020-03-15 DIAGNOSIS — Z7951 Long term (current) use of inhaled steroids: Secondary | ICD-10-CM | POA: Diagnosis not present

## 2020-03-15 DIAGNOSIS — E119 Type 2 diabetes mellitus without complications: Secondary | ICD-10-CM | POA: Diagnosis not present

## 2020-03-16 ENCOUNTER — Encounter: Payer: Self-pay | Admitting: Internal Medicine

## 2020-03-24 ENCOUNTER — Other Ambulatory Visit: Payer: Self-pay

## 2020-03-24 ENCOUNTER — Encounter: Payer: Medicare PPO | Attending: Internal Medicine | Admitting: Dietician

## 2020-03-24 ENCOUNTER — Encounter: Payer: Self-pay | Admitting: Dietician

## 2020-03-24 VITALS — Ht 63.0 in | Wt 181.9 lb

## 2020-03-24 DIAGNOSIS — Z713 Dietary counseling and surveillance: Secondary | ICD-10-CM | POA: Diagnosis not present

## 2020-03-24 DIAGNOSIS — E119 Type 2 diabetes mellitus without complications: Secondary | ICD-10-CM | POA: Diagnosis not present

## 2020-03-24 NOTE — Patient Instructions (Signed)
   Good job making healthy food choices and controlling amounts of carbohydrate foods -- keep it up!  Continue with regular walking or other activity

## 2020-03-24 NOTE — Progress Notes (Signed)
Diabetes Self-Management Education  Visit Type:  Follow-up  Appt. Start Time: 1035 Appt. End Time: 4742  03/24/2020  [name], identified by name and date of birth, is a 74 y.o. female with a diagnosis of Diabetes:  .   ASSESSMENT  Height 5\' 3"  (1.6 m), weight 181 lb 14.4 oz (82.5 kg). Body mass index is 32.22 kg/m.   Diabetes Self-Management Education - 59/56/38 7564      Complications   How often do you check your blood sugar?  0 times/day (not testing)    Number of hypoglycemic episodes per month  0    Have you had a dilated eye exam in the past 12 months?  No    Have you had a dental exam in the past 12 months?  Yes    Are you checking your feet?  Yes  (Pended)     How many days per week are you checking your feet?  6      Dietary Intake   Breakfast  3 meals and 1-2 snacks daily    Beverage(s)  water, occasionally lightly sweet tea, black coffee in am, ginger ale if not feeling well      Exercise   Exercise Type  Light (walking / raking leaves)    How many days per week to you exercise?  3    How many minutes per day do you exercise?  20    Total minutes per week of exercise  60      Patient Education   Nutrition management   Meal options for control of blood glucose level and chronic complications.;Other (comment)  (Pended)    basic meal planning   Monitoring  Identified appropriate SMBG and/or A1C goals.  (Pended)       Post-Education Assessment   Patient understands the diabetes disease and treatment process.  Demonstrates understanding / competency    Patient understands incorporating nutritional management into lifestyle.  Demonstrates understanding / competency    Patient undertands incorporating physical activity into lifestyle.  Demonstrates understanding / competency    Patient understands using medications safely.  Demonstrates understanding / competency    Patient understands monitoring blood glucose, interpreting and using results  Demonstrates  understanding / competency    Patient understands prevention, detection, and treatment of acute complications.  Needs Review    Patient understands prevention, detection, and treatment of chronic complications.  Needs Review    Patient understands how to develop strategies to address psychosocial issues.  Needs Review    Patient understands how to develop strategies to promote health/change behavior.  Demonstrates understanding / competency      Outcomes   Program Status  Completed  (Pended)        Learning Objective:  Patient will have a greater understanding of diabetes self-management. Patient education plan is to attend individual and/or group sessions per assessed needs and concerns.  Notes:  Patient reports one episode of loose stool and vomiting soon after breakfast of protein shake and 1 pc toast with peanut butter. She has consumed these foods multiple times in the past without issue, but not together.   She has also been experiencing seasonal allergy symptoms including sinus drainage for the past several weeks.  Tested BG in office today with result of 109mg /dl 2-3 hours after breakfast meal.   Plan:   Patient Instructions   Good job making healthy food choices and controlling amounts of carbohydrate foods -- keep it up!  Continue with regular walking or  other activity    Expected Outcomes:  (P) Demonstrated interest in learning. Expect positive outcomes  Education material provided: individualized menus, Quick and Balanced Meals sample menus  If problems or questions, patient to contact team via:  Phone

## 2020-04-13 ENCOUNTER — Ambulatory Visit (INDEPENDENT_AMBULATORY_CARE_PROVIDER_SITE_OTHER): Payer: Medicare PPO

## 2020-04-13 ENCOUNTER — Other Ambulatory Visit: Payer: Self-pay

## 2020-04-13 DIAGNOSIS — E538 Deficiency of other specified B group vitamins: Secondary | ICD-10-CM | POA: Diagnosis not present

## 2020-04-13 MED ORDER — CYANOCOBALAMIN 1000 MCG/ML IJ SOLN
1000.0000 ug | Freq: Once | INTRAMUSCULAR | Status: AC
  Administered 2020-04-13: 1000 ug via INTRAMUSCULAR

## 2020-04-13 NOTE — Progress Notes (Addendum)
Patient presented for B 12 injection to left deltoid, patient voiced no concerns nor showed any signs of distress during injection.  Reviewed.  Dr [name] 

## 2020-04-21 ENCOUNTER — Other Ambulatory Visit: Payer: Self-pay | Admitting: Internal Medicine

## 2020-05-10 ENCOUNTER — Telehealth: Payer: Self-pay | Admitting: *Deleted

## 2020-05-10 DIAGNOSIS — E7800 Pure hypercholesterolemia, unspecified: Secondary | ICD-10-CM

## 2020-05-10 DIAGNOSIS — R739 Hyperglycemia, unspecified: Secondary | ICD-10-CM

## 2020-05-10 DIAGNOSIS — I10 Essential (primary) hypertension: Secondary | ICD-10-CM

## 2020-05-10 NOTE — Telephone Encounter (Signed)
Please place future orders for lab appt.  

## 2020-05-10 NOTE — Telephone Encounter (Signed)
Order placed for f/u labs.  

## 2020-05-12 ENCOUNTER — Other Ambulatory Visit (INDEPENDENT_AMBULATORY_CARE_PROVIDER_SITE_OTHER): Payer: Medicare PPO

## 2020-05-12 ENCOUNTER — Other Ambulatory Visit: Payer: Self-pay

## 2020-05-12 DIAGNOSIS — E7800 Pure hypercholesterolemia, unspecified: Secondary | ICD-10-CM | POA: Diagnosis not present

## 2020-05-12 DIAGNOSIS — R739 Hyperglycemia, unspecified: Secondary | ICD-10-CM

## 2020-05-12 DIAGNOSIS — I10 Essential (primary) hypertension: Secondary | ICD-10-CM | POA: Diagnosis not present

## 2020-05-12 LAB — BASIC METABOLIC PANEL
BUN: 14 mg/dL (ref 6–23)
CO2: 30 mEq/L (ref 19–32)
Calcium: 10 mg/dL (ref 8.4–10.5)
Chloride: 101 mEq/L (ref 96–112)
Creatinine, Ser: 0.74 mg/dL (ref 0.40–1.20)
GFR: 92.34 mL/min (ref >60.00)
Glucose, Bld: 115 mg/dL — ABNORMAL HIGH (ref 70–99)
Potassium: 3.9 mEq/L (ref 3.5–5.1)
Sodium: 137 mEq/L (ref 135–145)

## 2020-05-12 LAB — LIPID PANEL
Cholesterol: 215 mg/dL — ABNORMAL HIGH (ref 0–200)
HDL: 50.9 mg/dL (ref >39.00)
LDL Cholesterol: 135 mg/dL — ABNORMAL HIGH (ref 0–99)
NonHDL: 163.65
Total CHOL/HDL Ratio: 4
Triglycerides: 145 mg/dL (ref 0.0–149.0)
VLDL: 29 mg/dL (ref 0.0–40.0)

## 2020-05-12 LAB — HEPATIC FUNCTION PANEL
ALT: 11 U/L (ref 0–35)
AST: 16 U/L (ref 0–37)
Albumin: 4.1 g/dL (ref 3.5–5.2)
Alkaline Phosphatase: 72 U/L (ref 39–117)
Bilirubin, Direct: 0.1 mg/dL (ref 0.0–0.3)
Total Bilirubin: 0.4 mg/dL (ref 0.2–1.2)
Total Protein: 7.2 g/dL (ref 6.0–8.3)

## 2020-05-12 LAB — HEMOGLOBIN A1C: Hgb A1c MFr Bld: 6.2 % (ref 4.6–6.5)

## 2020-05-14 ENCOUNTER — Ambulatory Visit (INDEPENDENT_AMBULATORY_CARE_PROVIDER_SITE_OTHER): Payer: Medicare PPO | Admitting: Internal Medicine

## 2020-05-14 ENCOUNTER — Other Ambulatory Visit: Payer: Self-pay

## 2020-05-14 VITALS — BP 118/78 | HR 95 | Temp 97.9°F | Resp 16 | Ht 63.0 in | Wt 183.2 lb

## 2020-05-14 DIAGNOSIS — E7800 Pure hypercholesterolemia, unspecified: Secondary | ICD-10-CM | POA: Diagnosis not present

## 2020-05-14 DIAGNOSIS — K219 Gastro-esophageal reflux disease without esophagitis: Secondary | ICD-10-CM

## 2020-05-14 DIAGNOSIS — E119 Type 2 diabetes mellitus without complications: Secondary | ICD-10-CM

## 2020-05-14 DIAGNOSIS — E538 Deficiency of other specified B group vitamins: Secondary | ICD-10-CM

## 2020-05-14 DIAGNOSIS — J4520 Mild intermittent asthma, uncomplicated: Secondary | ICD-10-CM | POA: Diagnosis not present

## 2020-05-14 DIAGNOSIS — Z1211 Encounter for screening for malignant neoplasm of colon: Secondary | ICD-10-CM

## 2020-05-14 DIAGNOSIS — D649 Anemia, unspecified: Secondary | ICD-10-CM

## 2020-05-14 DIAGNOSIS — R5383 Other fatigue: Secondary | ICD-10-CM

## 2020-05-14 DIAGNOSIS — I10 Essential (primary) hypertension: Secondary | ICD-10-CM | POA: Diagnosis not present

## 2020-05-14 DIAGNOSIS — Z0000 Encounter for general adult medical examination without abnormal findings: Secondary | ICD-10-CM

## 2020-05-14 MED ORDER — CYANOCOBALAMIN 1000 MCG/ML IJ SOLN
1000.0000 ug | Freq: Once | INTRAMUSCULAR | Status: AC
  Administered 2020-05-14: 1000 ug via INTRAMUSCULAR

## 2020-05-14 NOTE — Progress Notes (Addendum)
Patient ID: Lisa Robles, female   DOB: 08-17-1946, 74 y.o.   MRN: [id number]   Subjective:    Patient ID: Lisa Robles, female    DOB: 1946/09/26, 74 y.o.   MRN: [id number]  HPI This visit occurred during the SARS-CoV-2 public health emergency.  Safety protocols were in place, including screening questions prior to the visit, additional usage of staff PPE, and extensive cleaning of exam room while observing appropriate contact time as indicated for disinfecting solutions.  Patient here for her physical exam.  She reports she has been watching her diet. a1c improved.  Trying to stay active.  No chest pain or sob reported.  Is concerned regarding sleep apnea.  Daytime fatigue.  No acid reflux reported.  No abdominal pain.  Bowels moving.  Labs discussed.     Past Medical History:  Diagnosis Date  . Diabetes mellitus without complication ([place])   . GERD (gastroesophageal reflux disease)   . Hypercholesteremia   . Hypertension    Past Surgical History:  Procedure Laterality Date  . BREAST BIOPSY    . TOOTH EXTRACTION     tooth implantation  . TUBAL LIGATION  1979   Family History  Problem Relation Age of Onset  . Heart attack Mother   . Heart attack Father   . Hypercholesterolemia Brother   . Diabetes Brother   . Cancer Maternal Aunt        breast  . Diabetes Maternal Grandmother   . Colon cancer Neg Hx   . Breast cancer Neg Hx    Social History   Socioeconomic History  . Marital status: Widowed    Spouse name: Not on file  . Number of children: 2  . Years of education: masters  . Highest education level: Not on file  Occupational History  . Occupation: retired Pharmacist, hospital  Tobacco Use  . Smoking status: Former Smoker    Packs/day: 1.00    Years: 7.00    Pack years: 7.00    Types: Cigarettes    Quit date: 12/04/1977    Years since quitting: 42.4  . Smokeless tobacco: Never Used  Vaping Use  . Vaping Use: Never used  Substance and Sexual Activity  . Alcohol use: No     Alcohol/week: 0.0 standard drinks  . Drug use: No  . Sexual activity: Yes    Birth control/protection: None    Comment: Is sexually active wih same partner x 14 years. Recently found out partner has been cheating.  Other Topics Concern  . Not on file  Social History Narrative  . Not on file   Social Determinants of Health   Financial Resource Strain: Low Risk   . Difficulty of Paying Living Expenses: Not hard at all  Food Insecurity: No Food Insecurity  . Worried About Charity fundraiser in the Last Year: Never true  . Ran Out of Food in the Last Year: Never true  Transportation Needs:   . Lack of Transportation (Medical):   Marland Kitchen Lack of Transportation (Non-Medical):   Physical Activity: Insufficiently Active  . Days of Exercise per Week: 7 days  . Minutes of Exercise per Session: 20 min  Stress: No Stress Concern Present  . Feeling of Stress : Not at all  Social Connections: Unknown  . Frequency of Communication with Friends and Family: More than three times a week  . Frequency of Social Gatherings with Friends and Family: More than three times a week  . Attends Religious Services: Not on file  .  Active Member of Clubs or Organizations: Yes  . Attends Archivist Meetings: Not on file  . Marital Status: Widowed    Outpatient Encounter Medications as of 05/14/2020  Medication Sig  . albuterol (VENTOLIN HFA) 108 (90 Base) MCG/ACT inhaler Inhale 2 puffs into the lungs every 6 (six) hours as needed.  Marland Kitchen aspirin 81 MG tablet Take 81 mg by mouth daily.  . benzonatate (TESSALON) 100 MG capsule Take 1 capsule (100 mg total) by mouth 3 (three) times daily as needed for cough. (Patient not taking: Reported on 03/02/2020)  . Clotrimazole (LOTRIMIN AF EX) Apply 1 application topically daily as needed. prn   . conjugated estrogens (PREMARIN) vaginal cream Place 1 Applicatorful vaginally daily. Use pea sized amount M-W-Fr before bedtime (Patient taking differently: Place 1  Applicatorful vaginally daily as needed. Use pea sized amount M-W-Fr before bedtime)  . docusate sodium (COLACE) 100 MG capsule Take 1 capsule (100 mg total) by mouth every 12 (twelve) hours. (Patient not taking: Reported on 03/02/2020)  . fluticasone (FLONASE) 50 MCG/ACT nasal spray Place 2 sprays into both nostrils daily. (Patient not taking: Reported on 03/02/2020)  . guaiFENesin (MUCINEX) 600 MG 12 hr tablet Take 2 tablets (1,200 mg total) by mouth 2 (two) times daily as needed for cough or to loosen phlegm.  . hydrochlorothiazide (HYDRODIURIL) 25 MG tablet TAKE 1 TABLET(25 MG) BY MOUTH DAILY  . loratadine (CLARITIN) 10 MG tablet Take 10 mg by mouth daily.  . meclizine (ANTIVERT) 25 MG tablet Take 1 tablet (25 mg total) by mouth 3 (three) times daily as needed for dizziness. (Patient not taking: Reported on 03/02/2020)  . nystatin cream (MYCOSTATIN) Apply 1 application topically 2 (two) times daily. (Patient not taking: Reported on 03/02/2020)  . pantoprazole (PROTONIX) 40 MG tablet TAKE 1 TABLET(40 MG) BY MOUTH DAILY  . pravastatin (PRAVACHOL) 20 MG tablet TAKE 1 TABLET(20 MG) BY MOUTH DAILY  . WIXELA INHUB 100-50 MCG/DOSE AEPB INHALE 1 PUFF BY MOUTH TWICE DAILY (Patient not taking: Reported on 03/02/2020)  . [EXPIRED] cyanocobalamin ((VITAMIN B-12)) injection 1,000 mcg    No facility-administered encounter medications on file as of 05/14/2020.    Review of Systems  Constitutional: Negative for appetite change and unexpected weight change.  HENT: Negative for congestion and sinus pressure.   Eyes: Negative for pain and visual disturbance.  Respiratory: Negative for cough, chest tightness and shortness of breath.   Cardiovascular: Negative for chest pain, palpitations and leg swelling.  Gastrointestinal: Negative for abdominal pain, diarrhea, nausea and vomiting.  Genitourinary: Negative for difficulty urinating and dysuria.  Musculoskeletal: Negative for joint swelling and myalgias.  Skin:  Negative for color change and rash.  Neurological: Negative for dizziness, light-headedness and headaches.  Hematological: Negative for adenopathy. Does not bruise/bleed easily.  Psychiatric/Behavioral: Negative for agitation and dysphoric mood.       Objective:    Physical Exam Constitutional:      General: She is not in acute distress.    Appearance: Normal appearance. She is well-developed.  HENT:     Head: Normocephalic and atraumatic.     Right Ear: External ear normal.     Left Ear: External ear normal.  Eyes:     General: No scleral icterus.       Right eye: No discharge.        Left eye: No discharge.     Conjunctiva/sclera: Conjunctivae normal.  Neck:     Thyroid: No thyromegaly.  Cardiovascular:     Rate  and Rhythm: Normal rate and regular rhythm.  Pulmonary:     Effort: No tachypnea, accessory muscle usage or respiratory distress.     Breath sounds: Normal breath sounds. No decreased breath sounds or wheezing.  Chest:     Breasts:        Right: No inverted nipple, mass, nipple discharge or tenderness (no axillary adenopathy).        Left: No inverted nipple, mass, nipple discharge or tenderness (no axilarry adenopathy).  Abdominal:     General: Bowel sounds are normal.     Palpations: Abdomen is soft.     Tenderness: There is no abdominal tenderness.  Musculoskeletal:        General: No swelling or tenderness.     Cervical back: Neck supple. No tenderness.  Lymphadenopathy:     Cervical: No cervical adenopathy.  Skin:    Findings: No erythema or rash.  Neurological:     Mental Status: She is alert and oriented to person, place, and time.  Psychiatric:        Mood and Affect: Mood normal.        Behavior: Behavior normal.     BP 118/78   Pulse 95   Temp 97.9 F (36.6 C)   Resp 16   Ht 5' 3" (1.6 m)   Wt 183 lb 3.2 oz (83.1 kg)   SpO2 99%   BMI 32.45 kg/m  Wt Readings from Last 3 Encounters:  05/14/20 183 lb 3.2 oz (83.1 kg)  03/24/20 181 lb  14.4 oz (82.5 kg)  03/08/20 183 lb (83 kg)     Lab Results  Component Value Date   WBC 8.1 01/20/2020   HGB 13.6 01/20/2020   HCT 41.1 01/20/2020   PLT 350.0 01/20/2020   GLUCOSE 115 (H) 05/12/2020   CHOL 215 (H) 05/12/2020   TRIG 145.0 05/12/2020   HDL 50.90 05/12/2020   LDLDIRECT 142.7 12/11/2013   LDLCALC 135 (H) 05/12/2020   ALT 11 05/12/2020   AST 16 05/12/2020   NA 137 05/12/2020   K 3.9 05/12/2020   CL 101 05/12/2020   CREATININE 0.74 05/12/2020   BUN 14 05/12/2020   CO2 30 05/12/2020   TSH 1.86 01/20/2020   HGBA1C 6.2 05/12/2020   MICROALBUR 0.2 12/11/2013       Assessment & Plan:   Problem List Items Addressed This Visit    Anemia    Follow cbc.        Asthma    Breathing stable.       B12 deficiency    Continue b12 injections.        Diabetes mellitus without complication ([facility])    Low carb diet and exercise.  a1c improved.  Follow met b and a1c.  Lab Results  Component Value Date   HGBA1C 6.2 05/12/2020        Relevant Orders   Hemoglobin A1c   Microalbumin / creatinine urine ratio   Fatigue    Previous fatigue as outlined.  Saw cardiology. Did not feel cardiac w/up warranted.  She was questioning and expressed desire for further testing for sleep apnea. Pulmonary referral.        Relevant Orders   Ambulatory referral to Pulmonology   GERD (gastroesophageal reflux disease)    On protonixx.  No upper symptoms reported.        Health care maintenance    Physical today 05/14/20.  Mammogram 09/26/19 - Briads I.  Colonoscopy 09/2011.  Recommended f/u in 10  years.        Hypercholesterolemia    On pravastatin.  Some minimal aching, but she overall feels she is tolerating.  Desires not to increase dose.  Continue pravastatin.  Continue low cholesterol diet and exercise.  Follow lipid panel and liver function tests.        Relevant Orders   Hepatic function panel   Lipid panel   Hypertension    On hctz.  Blood pressure doing well.   120/82 on my check.  Follow pressures.  Follow metabolic panel.        Relevant Orders   Basic metabolic panel    Other Visit Diagnoses    Routine general medical examination at a health care facility    -  Primary   Colon cancer screening       Relevant Orders   Fecal occult blood, imunochemical       Einar Pheasant, MD

## 2020-05-15 ENCOUNTER — Encounter: Payer: Self-pay | Admitting: Internal Medicine

## 2020-05-15 ENCOUNTER — Telehealth: Payer: Self-pay | Admitting: Internal Medicine

## 2020-05-15 NOTE — Assessment & Plan Note (Signed)
On pravastatin.  Some minimal aching, but she overall feels she is tolerating.  Desires not to increase dose.  Continue pravastatin.  Continue low cholesterol diet and exercise.  Follow lipid panel and liver function tests.

## 2020-05-15 NOTE — Assessment & Plan Note (Signed)
Low carb diet and exercise.  a1c improved.  Follow met b and a1c.  Lab Results  Component Value Date   HGBA1C 6.2 05/12/2020

## 2020-05-15 NOTE — Addendum Note (Signed)
Addended by: Charm Barges on: 05/15/2020 05:56 PM   Modules accepted: Orders

## 2020-05-15 NOTE — Assessment & Plan Note (Signed)
On protonixx.  No upper symptoms reported.

## 2020-05-15 NOTE — Assessment & Plan Note (Signed)
Previous fatigue as outlined.  Saw cardiology. Did not feel cardiac w/up warranted.  She was questioning and expressed desire for further testing for sleep apnea. Pulmonary referral.

## 2020-05-15 NOTE — Assessment & Plan Note (Signed)
Physical today 05/14/20.  Mammogram 09/26/19 - Briads I.  Colonoscopy 09/2011.  Recommended f/u in 10 years.

## 2020-05-15 NOTE — Assessment & Plan Note (Signed)
Continue b12 injections.  

## 2020-05-15 NOTE — Telephone Encounter (Signed)
Please notify pt that I would like for her to do a set of stool cards - colon cancer screening.  I have placed order.

## 2020-05-15 NOTE — Assessment & Plan Note (Signed)
On hctz.  Blood pressure doing well.  120/82 on my check.  Follow pressures.  Follow metabolic panel.

## 2020-05-15 NOTE — Assessment & Plan Note (Signed)
Follow cbc.  

## 2020-05-15 NOTE — Assessment & Plan Note (Signed)
Breathing stable.

## 2020-05-17 NOTE — Telephone Encounter (Signed)
Stool kit placed up front for pick up. Patient is aware.

## 2020-05-20 ENCOUNTER — Other Ambulatory Visit (INDEPENDENT_AMBULATORY_CARE_PROVIDER_SITE_OTHER): Payer: Medicare PPO

## 2020-05-20 DIAGNOSIS — Z1211 Encounter for screening for malignant neoplasm of colon: Secondary | ICD-10-CM | POA: Diagnosis not present

## 2020-05-20 LAB — FECAL OCCULT BLOOD, IMMUNOCHEMICAL: Fecal Occult Bld: NEGATIVE

## 2020-05-25 ENCOUNTER — Other Ambulatory Visit: Payer: Self-pay | Admitting: Internal Medicine

## 2020-06-27 ENCOUNTER — Other Ambulatory Visit: Payer: Self-pay | Admitting: Internal Medicine

## 2020-06-30 DIAGNOSIS — H1045 Other chronic allergic conjunctivitis: Secondary | ICD-10-CM | POA: Diagnosis not present

## 2020-06-30 DIAGNOSIS — J019 Acute sinusitis, unspecified: Secondary | ICD-10-CM | POA: Diagnosis not present

## 2020-06-30 DIAGNOSIS — J301 Allergic rhinitis due to pollen: Secondary | ICD-10-CM | POA: Diagnosis not present

## 2020-07-01 ENCOUNTER — Ambulatory Visit: Payer: Medicare PPO

## 2020-07-15 ENCOUNTER — Institutional Professional Consult (permissible substitution): Payer: Medicare PPO | Admitting: Pulmonary Disease

## 2020-07-22 ENCOUNTER — Ambulatory Visit: Payer: Medicare PPO

## 2020-07-29 ENCOUNTER — Other Ambulatory Visit: Payer: Self-pay

## 2020-07-29 ENCOUNTER — Ambulatory Visit (INDEPENDENT_AMBULATORY_CARE_PROVIDER_SITE_OTHER): Payer: Medicare PPO

## 2020-07-29 DIAGNOSIS — E538 Deficiency of other specified B group vitamins: Secondary | ICD-10-CM

## 2020-07-29 MED ORDER — CYANOCOBALAMIN 1000 MCG/ML IJ SOLN
1000.0000 ug | Freq: Once | INTRAMUSCULAR | Status: AC
  Administered 2020-07-29: 1000 ug via INTRAMUSCULAR

## 2020-07-29 NOTE — Progress Notes (Addendum)
Patient presented for B 12 injection to Left deltoid, Patient voiced no concerns nor showed any signs of distress during injection.  Reviewed.  Dr [name] 

## 2020-08-13 DIAGNOSIS — J329 Chronic sinusitis, unspecified: Secondary | ICD-10-CM | POA: Diagnosis not present

## 2020-09-01 ENCOUNTER — Other Ambulatory Visit: Payer: Self-pay

## 2020-09-01 ENCOUNTER — Ambulatory Visit (INDEPENDENT_AMBULATORY_CARE_PROVIDER_SITE_OTHER): Payer: Medicare PPO

## 2020-09-01 DIAGNOSIS — E538 Deficiency of other specified B group vitamins: Secondary | ICD-10-CM

## 2020-09-01 MED ORDER — CYANOCOBALAMIN 1000 MCG/ML IJ SOLN
1000.0000 ug | Freq: Once | INTRAMUSCULAR | Status: AC
Start: 2020-09-01 — End: 2020-09-01
  Administered 2020-09-01: 1000 ug via INTRAMUSCULAR

## 2020-09-01 NOTE — Progress Notes (Addendum)
Patient presented for B 12 injection to left deltoid, patient voiced no concerns nor showed any signs of distress during injection.  Reviewed.  Dr [name] 

## 2020-09-09 ENCOUNTER — Other Ambulatory Visit: Payer: Self-pay

## 2020-09-09 ENCOUNTER — Other Ambulatory Visit (INDEPENDENT_AMBULATORY_CARE_PROVIDER_SITE_OTHER): Payer: Medicare PPO

## 2020-09-09 DIAGNOSIS — E119 Type 2 diabetes mellitus without complications: Secondary | ICD-10-CM

## 2020-09-09 DIAGNOSIS — E7800 Pure hypercholesterolemia, unspecified: Secondary | ICD-10-CM

## 2020-09-09 DIAGNOSIS — I10 Essential (primary) hypertension: Secondary | ICD-10-CM

## 2020-09-09 LAB — LIPID PANEL
Cholesterol: 209 mg/dL — ABNORMAL HIGH (ref 0–200)
HDL: 47.5 mg/dL (ref >39.00)
LDL Cholesterol: 136 mg/dL — ABNORMAL HIGH (ref 0–99)
NonHDL: 161.15
Total CHOL/HDL Ratio: 4
Triglycerides: 126 mg/dL (ref 0.0–149.0)
VLDL: 25.2 mg/dL (ref 0.0–40.0)

## 2020-09-09 LAB — BASIC METABOLIC PANEL
BUN: 12 mg/dL (ref 6–23)
CO2: 30 mEq/L (ref 19–32)
Calcium: 10 mg/dL (ref 8.4–10.5)
Chloride: 102 mEq/L (ref 96–112)
Creatinine, Ser: 0.85 mg/dL (ref 0.40–1.20)
GFR: 66.42 mL/min (ref >60.00)
Glucose, Bld: 118 mg/dL — ABNORMAL HIGH (ref 70–99)
Potassium: 3.8 mEq/L (ref 3.5–5.1)
Sodium: 137 mEq/L (ref 135–145)

## 2020-09-09 LAB — HEPATIC FUNCTION PANEL
ALT: 15 U/L (ref 0–35)
AST: 17 U/L (ref 0–37)
Albumin: 4 g/dL (ref 3.5–5.2)
Alkaline Phosphatase: 73 U/L (ref 39–117)
Bilirubin, Direct: 0.1 mg/dL (ref 0.0–0.3)
Total Bilirubin: 0.6 mg/dL (ref 0.2–1.2)
Total Protein: 7 g/dL (ref 6.0–8.3)

## 2020-09-09 LAB — MICROALBUMIN / CREATININE URINE RATIO
Creatinine,U: 236.4 mg/dL
Microalb Creat Ratio: 0.5 mg/g (ref 0.0–30.0)
Microalb, Ur: 1.1 mg/dL (ref 0.0–1.9)

## 2020-09-09 LAB — HEMOGLOBIN A1C: Hgb A1c MFr Bld: 6.4 % (ref 4.6–6.5)

## 2020-09-09 NOTE — Addendum Note (Signed)
Addended by: Warden Fillers on: 09/09/2020 03:40 PM   Modules accepted: Orders

## 2020-09-10 DIAGNOSIS — D2261 Melanocytic nevi of right upper limb, including shoulder: Secondary | ICD-10-CM | POA: Diagnosis not present

## 2020-09-10 DIAGNOSIS — L309 Dermatitis, unspecified: Secondary | ICD-10-CM | POA: Diagnosis not present

## 2020-09-10 DIAGNOSIS — J329 Chronic sinusitis, unspecified: Secondary | ICD-10-CM | POA: Diagnosis not present

## 2020-09-10 DIAGNOSIS — J301 Allergic rhinitis due to pollen: Secondary | ICD-10-CM | POA: Diagnosis not present

## 2020-09-10 DIAGNOSIS — D225 Melanocytic nevi of trunk: Secondary | ICD-10-CM | POA: Diagnosis not present

## 2020-09-10 DIAGNOSIS — H6981 Other specified disorders of Eustachian tube, right ear: Secondary | ICD-10-CM | POA: Diagnosis not present

## 2020-09-10 DIAGNOSIS — D2262 Melanocytic nevi of left upper limb, including shoulder: Secondary | ICD-10-CM | POA: Diagnosis not present

## 2020-09-10 DIAGNOSIS — L820 Inflamed seborrheic keratosis: Secondary | ICD-10-CM | POA: Diagnosis not present

## 2020-09-10 DIAGNOSIS — D2272 Melanocytic nevi of left lower limb, including hip: Secondary | ICD-10-CM | POA: Diagnosis not present

## 2020-09-13 ENCOUNTER — Other Ambulatory Visit: Payer: Self-pay

## 2020-09-13 ENCOUNTER — Telehealth (INDEPENDENT_AMBULATORY_CARE_PROVIDER_SITE_OTHER): Payer: Medicare PPO | Admitting: Internal Medicine

## 2020-09-13 ENCOUNTER — Encounter: Payer: Self-pay | Admitting: Internal Medicine

## 2020-09-13 DIAGNOSIS — J208 Acute bronchitis due to other specified organisms: Secondary | ICD-10-CM

## 2020-09-13 DIAGNOSIS — E7800 Pure hypercholesterolemia, unspecified: Secondary | ICD-10-CM

## 2020-09-13 DIAGNOSIS — I10 Essential (primary) hypertension: Secondary | ICD-10-CM | POA: Diagnosis not present

## 2020-09-13 DIAGNOSIS — E119 Type 2 diabetes mellitus without complications: Secondary | ICD-10-CM

## 2020-09-13 DIAGNOSIS — K219 Gastro-esophageal reflux disease without esophagitis: Secondary | ICD-10-CM

## 2020-09-13 DIAGNOSIS — J4520 Mild intermittent asthma, uncomplicated: Secondary | ICD-10-CM | POA: Diagnosis not present

## 2020-09-13 MED ORDER — ALBUTEROL SULFATE HFA 108 (90 BASE) MCG/ACT IN AERS: 2.0000 | INHALATION_SPRAY | Freq: Four times a day (QID) | RESPIRATORY_TRACT | 2 refills | Status: DC | PRN

## 2020-09-13 NOTE — Progress Notes (Signed)
Patient ID: Lisa Robles, female   DOB: 09-21-1946, 74 y.o.   MRN: [id number]   Virtual Visit via video Note  This visit type was conducted due to national recommendations for restrictions regarding the COVID-19 pandemic (e.g. social distancing).  This format is felt to be most appropriate for this patient at this time.  All issues noted in this document were discussed and addressed.  No physical exam was performed (except for noted visual exam findings with Video Visits).   I connected with [name] by a video enabled telemedicine application and verified that I am speaking with the correct person using two identifiers. Location patient: home Location provider: work  Persons participating in the virtual visit: patient, provider  The limitations, risks, security and privacy concerns of performing an evaluation and management service by video and the availability of in person appointments have been discussed. It has also been discussed with the patient that there may be a patient responsible charge related to this service. The patient expressed understanding and agreed to proceed.   Reason for visit: scheduled follow up.    HPI: Follow up regarding her sugars, cholesterol and blood pressure.  She reports she is doing relatively well.  Saw dermatology last week - skin assessment.  Had several places removed.  Checked out ok.  Also has seen ENT - Dr Richardson Landry.  Topical steroid.  No increased sinus pressure. No increased chest congestion or sob.  No abdominal pain.  Bowels moving.  Discussed diet and exercise.  Discussed recent labs.  a1c 6.4.  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  Overall she feels she is doing relatively well.     ROS: See pertinent positives and negatives per HPI.  Past Medical History:  Diagnosis Date  . Diabetes mellitus without complication ([place])   . GERD (gastroesophageal reflux disease)   . Hypercholesteremia   . Hypertension     Past  Surgical History:  Procedure Laterality Date  . BREAST BIOPSY    . TOOTH EXTRACTION     tooth implantation  . TUBAL LIGATION  1979    Family History  Problem Relation Age of Onset  . Heart attack Mother   . Heart attack Father   . Hypercholesterolemia Brother   . Diabetes Brother   . Cancer Maternal Aunt        breast  . Diabetes Maternal Grandmother   . Colon cancer Neg Hx   . Breast cancer Neg Hx     SOCIAL HX: reviewed.    Current Outpatient Medications:  .  albuterol (VENTOLIN HFA) 108 (90 Base) MCG/ACT inhaler, Inhale 2 puffs into the lungs every 6 (six) hours as needed., Disp: 18 g, Rfl: 2 .  benzonatate (TESSALON) 100 MG capsule, Take 1 capsule (100 mg total) by mouth 3 (three) times daily as needed for cough., Disp: 21 capsule, Rfl: 0 .  conjugated estrogens (PREMARIN) vaginal cream, Place 1 Applicatorful vaginally daily. Use pea sized amount M-W-Fr before bedtime (Patient taking differently: Place 1 Applicatorful vaginally daily as needed. Use pea sized amount M-W-Fr before bedtime), Disp: 42.5 g, Rfl: 12 .  docusate sodium (COLACE) 100 MG capsule, Take 1 capsule (100 mg total) by mouth every 12 (twelve) hours., Disp: 60 capsule, Rfl: 0 .  guaiFENesin (MUCINEX) 600 MG 12 hr tablet, Take 2 tablets (1,200 mg total) by mouth 2 (two) times daily as needed for cough or to loosen phlegm., Disp: 20 tablet, Rfl: 0 .  hydrochlorothiazide (HYDRODIURIL) 25  MG tablet, TAKE 1 TABLET(25 MG) BY MOUTH DAILY, Disp: 90 tablet, Rfl: 1 .  loratadine (CLARITIN) 10 MG tablet, Take 10 mg by mouth daily., Disp: , Rfl:  .  meclizine (ANTIVERT) 25 MG tablet, Take 1 tablet (25 mg total) by mouth 3 (three) times daily as needed for dizziness., Disp: 30 tablet, Rfl: 0 .  nystatin cream (MYCOSTATIN), Apply 1 application topically 2 (two) times daily., Disp: 60 g, Rfl: 0 .  pantoprazole (PROTONIX) 40 MG tablet, TAKE 1 TABLET(40 MG) BY MOUTH DAILY, Disp: 30 tablet, Rfl: 2 .  pravastatin (PRAVACHOL) 20 MG  tablet, TAKE 1 TABLET(20 MG) BY MOUTH DAILY, Disp: 90 tablet, Rfl: 1 .  WIXELA INHUB 100-50 MCG/DOSE AEPB, INHALE 1 PUFF BY MOUTH TWICE DAILY, Disp: 60 each, Rfl: 2 .  fluticasone (FLONASE) 50 MCG/ACT nasal spray, Place 2 sprays into both nostrils daily. (Patient not taking: Reported on 03/02/2020), Disp: 16 g, Rfl: 1  EXAM:  GENERAL: alert, oriented, appears well and in no acute distress  HEENT: atraumatic, conjunttiva clear, no obvious abnormalities on inspection of external nose and ears  NECK: normal movements of the head and neck  LUNGS: on inspection no signs of respiratory distress, breathing rate appears normal, no obvious gross SOB, gasping or wheezing  CV: no obvious cyanosis  PSYCH/NEURO: pleasant and cooperative, no obvious depression or anxiety, speech and thought processing grossly intact  ASSESSMENT AND PLAN:  Discussed the following assessment and plan:  Problem List Items Addressed This Visit    Hypertension    Blood pressure has been doing well. On hctz.  Follow pressures.  Follow metabolic panel.       Relevant Orders   CBC with Differential/Platelet   TSH   Hypercholesterolemia    On pravastatin.  Tolerating. Low cholesterol diet and exercise.  Follow lipid panel and liver function tests.   Lab Results  Component Value Date   CHOL 209 (H) 09/09/2020   HDL 47.50 09/09/2020   LDLCALC 136 (H) 09/09/2020   LDLDIRECT 142.7 12/11/2013   TRIG 126.0 09/09/2020   CHOLHDL 4 09/09/2020        Relevant Orders   Lipid panel   Hepatic function panel   GERD (gastroesophageal reflux disease)    On protonix.  No upper symptoms reported.       Diabetes mellitus without complication ([place])    U5K just checked - 6.4.  Low carb diet and exercise.  Follow met b and a1c.       Relevant Orders   Hemoglobin Y7C   Basic metabolic panel   Asthma    Breathing stable.  Has f/u with pulmonary 09/22/20.  Referred given concerns regarding sleep apnea.  See previous note.   Keep appt.       Relevant Medications   albuterol (VENTOLIN HFA) 108 (90 Base) MCG/ACT inhaler    Other Visit Diagnoses    Viral bronchitis       Relevant Medications   albuterol (VENTOLIN HFA) 108 (90 Base) MCG/ACT inhaler       I discussed the assessment and treatment plan with the patient. The patient was provided an opportunity to ask questions and all were answered. The patient agreed with the plan and demonstrated an understanding of the instructions.   The patient was advised to call back or seek an in-person evaluation if the symptoms worsen or if the condition fails to improve as anticipated.    Einar Pheasant, MD

## 2020-09-19 ENCOUNTER — Encounter: Payer: Self-pay | Admitting: Internal Medicine

## 2020-09-19 NOTE — Assessment & Plan Note (Signed)
Blood pressure has been doing well.  On hctz.  Follow pressures.  Follow metabolic panel.  

## 2020-09-19 NOTE — Assessment & Plan Note (Addendum)
Breathing stable.  Has f/u with pulmonary 09/22/20.  Referred given concerns regarding sleep apnea.  See previous note.  Keep appt.

## 2020-09-19 NOTE — Assessment & Plan Note (Signed)
On protonix.  No upper symptoms reported.   

## 2020-09-19 NOTE — Assessment & Plan Note (Signed)
a1c just checked - 6.4.  Low carb diet and exercise.  Follow met b and a1c.

## 2020-09-19 NOTE — Assessment & Plan Note (Signed)
On pravastatin.  Tolerating. Low cholesterol diet and exercise.  Follow lipid panel and liver function tests.   Lab Results  Component Value Date   CHOL 209 (H) 09/09/2020   HDL 47.50 09/09/2020   LDLCALC 136 (H) 09/09/2020   LDLDIRECT 142.7 12/11/2013   TRIG 126.0 09/09/2020   CHOLHDL 4 09/09/2020

## 2020-09-22 ENCOUNTER — Ambulatory Visit: Payer: Medicare PPO | Admitting: Pulmonary Disease

## 2020-09-22 ENCOUNTER — Other Ambulatory Visit: Payer: Self-pay

## 2020-09-22 ENCOUNTER — Encounter: Payer: Self-pay | Admitting: Pulmonary Disease

## 2020-09-22 VITALS — BP 138/82 | HR 74 | Temp 97.0°F | Ht 62.0 in | Wt 180.6 lb

## 2020-09-22 DIAGNOSIS — Z23 Encounter for immunization: Secondary | ICD-10-CM

## 2020-09-22 DIAGNOSIS — R0683 Snoring: Secondary | ICD-10-CM

## 2020-09-22 NOTE — Progress Notes (Signed)
[name]  Chief Complaint  Patient presents with  . Consult    sleep consult    Constitutional:  BP 138/82 (BP Location: Left Arm, Cuff Size: Normal)   Pulse 74   Temp (!) 97 F (36.1 C) (Other (Comment)) Comment (Src): wrist  Ht 5\' 2"  (1.575 m)   Wt 180 lb 9.6 oz (81.9 kg)   SpO2 97% Comment: room air  BMI 33.03 kg/m   Past Medical History:  DM, GERD, HTN, HLD  Past Surgical History:  Her  has a past surgical history that includes Tubal ligation (1979); Tooth Extraction; and Breast biopsy.  Brief Summary:  Lisa Robles is a 74 y.o. female with snoring.      Subjective:   She has been told that she snores.  She has trouble feeling sleepy during the day.  She has trouble sleeping on her back and doesn't dream much.  Several of her family members have sleep apnea and use CPAP.  She goes to sleep at 9 pm.  She falls asleep in 10 minutes.  She wakes up some times to use the bathroom.  She gets out of bed at 7 am.  She feels tired in the morning.  She denies morning headache.  She does not use anything to help her fall sleep or stay awake.  She denies sleep walking, sleep talking, bruxism, or nightmares.  There is no history of restless legs.  She denies sleep hallucinations, sleep paralysis, or cataplexy.  The Epworth score is 13 out of 24.  She was seen recently by Dr. 73 with ENT for otitis media.    Physical Exam:   Appearance - well kempt   ENMT - no sinus tenderness, no oral exudate, no LAN, Mallampati 4 airway, no stridor, scalloped tongue, mild cerumen build up, TM clear b/l  Respiratory - equal breath sounds bilaterally, no wheezing or rales  CV - s1s2 regular rate and rhythm, no murmurs  Ext - no clubbing, no edema  Skin - no rashes  Psych - normal mood and affect   Sleep Tests:    Social History:  She  reports that she quit smoking about 42 years ago. Her smoking use included cigarettes. She  has a 7.00 pack-year smoking history. She has never used smokeless tobacco. She reports that she does not drink alcohol and does not use drugs.  Family History:  Her family history includes Cancer in her maternal aunt; Diabetes in her brother and maternal grandmother; Heart attack in her father and mother; Hypercholesterolemia in her brother.    Discussion:  She has snoring, sleep disruption, apnea, and daytime sleepiness.  She has history of hypertension.  I am concerned she could have obstructive sleep apnea.  Assessment/Plan:   Snoring with excessive daytime sleepiness. - will need to arrange for a home sleep study  Obesity. - discussed how weight can impact sleep and risk for sleep disordered breathing - discussed options to assist with weight loss: combination of diet modification, cardiovascular and strength training exercises  Cardiovascular risk. - had an extensive discussion regarding the adverse health consequences related to untreated sleep disordered breathing - specifically discussed the risks for hypertension, coronary artery disease, cardiac dysrhythmias, cerebrovascular disease, and diabetes - lifestyle modification discussed  Safe driving practices. - discussed how sleep disruption can increase risk of accidents, particularly when driving - safe driving practices were discussed  Therapies for obstructive sleep apnea. - if the sleep study shows significant sleep  apnea, then various therapies for treatment were reviewed: CPAP, oral appliance, and surgical interventions  Otitis media. - she has completed several course of antibiotics and is now on antihistamine therapy - she is to follow up with Dr. Willeen Cass with ENT   Time Spent Involved in Patient Care on Day of Examination:  32 minutes  Follow up:  Patient Instructions  Will arrange for home sleep study Will call to arrange for follow up after sleep study reviewed    Medication List:   Allergies as of  09/22/2020      Reactions   Cinnamon Other (See Comments)   Mouth irritant   Bactrim [sulfamethoxazole-trimethoprim] Rash   Shellfish Allergy Rash      Medication List       Accurate as of September 22, 2020 11:40 AM. If you have any questions, ask your nurse or doctor.        albuterol 108 (90 Base) MCG/ACT inhaler Commonly known as: VENTOLIN HFA Inhale 2 puffs into the lungs every 6 (six) hours as needed.   azelastine 0.1 % nasal spray Commonly known as: ASTELIN 1 spray in the morning and at bedtime.   benzonatate 100 MG capsule Commonly known as: TESSALON Take 1 capsule (100 mg total) by mouth 3 (three) times daily as needed for cough.   conjugated estrogens vaginal cream Commonly known as: Premarin Place 1 Applicatorful vaginally daily. Use pea sized amount M-W-Fr before bedtime What changed:   when to take this  reasons to take this   docusate sodium 100 MG capsule Commonly known as: COLACE Take 1 capsule (100 mg total) by mouth every 12 (twelve) hours.   fluticasone 50 MCG/ACT nasal spray Commonly known as: FLONASE Place 2 sprays into both nostrils daily.   guaiFENesin 600 MG 12 hr tablet Commonly known as: MUCINEX Take 2 tablets (1,200 mg total) by mouth 2 (two) times daily as needed for cough or to loosen phlegm.   hydrochlorothiazide 25 MG tablet Commonly known as: HYDRODIURIL TAKE 1 TABLET(25 MG) BY MOUTH DAILY   loratadine 10 MG tablet Commonly known as: CLARITIN Take 10 mg by mouth daily.   meclizine 25 MG tablet Commonly known as: ANTIVERT Take 1 tablet (25 mg total) by mouth 3 (three) times daily as needed for dizziness.   nystatin cream Commonly known as: MYCOSTATIN Apply 1 application topically 2 (two) times daily.   pantoprazole 40 MG tablet Commonly known as: PROTONIX TAKE 1 TABLET(40 MG) BY MOUTH DAILY   pravastatin 20 MG tablet Commonly known as: PRAVACHOL TAKE 1 TABLET(20 MG) BY MOUTH DAILY   Wixela Inhub 100-50 MCG/DOSE  Aepb Generic drug: Fluticasone-Salmeterol INHALE 1 PUFF BY MOUTH TWICE DAILY       Signature:  Coralyn Helling, MD [facility] Pager - [phone] 09/22/2020, 11:40 AM

## 2020-09-22 NOTE — Patient Instructions (Signed)
Will arrange for home sleep study Will call to arrange for follow up after sleep study reviewed  

## 2020-10-05 ENCOUNTER — Other Ambulatory Visit: Payer: Self-pay

## 2020-10-05 ENCOUNTER — Ambulatory Visit (INDEPENDENT_AMBULATORY_CARE_PROVIDER_SITE_OTHER): Payer: Medicare PPO

## 2020-10-05 DIAGNOSIS — E538 Deficiency of other specified B group vitamins: Secondary | ICD-10-CM

## 2020-10-05 MED ORDER — CYANOCOBALAMIN 1000 MCG/ML IJ SOLN
1000.0000 ug | Freq: Once | INTRAMUSCULAR | Status: AC
  Administered 2020-10-05: 1000 ug via INTRAMUSCULAR

## 2020-10-05 NOTE — Progress Notes (Addendum)
Patient presented for B 12 injection to right deltoid, patient voiced no concerns nor showed any signs of distress during injection.  Reviewed.  Dr [name] 

## 2020-10-11 ENCOUNTER — Other Ambulatory Visit: Payer: Self-pay | Admitting: Internal Medicine

## 2020-10-11 DIAGNOSIS — Z1231 Encounter for screening mammogram for malignant neoplasm of breast: Secondary | ICD-10-CM

## 2020-10-14 ENCOUNTER — Other Ambulatory Visit: Payer: Self-pay

## 2020-10-14 ENCOUNTER — Ambulatory Visit
Admission: RE | Admit: 2020-10-14 | Discharge: 2020-10-14 | Disposition: A | Discharge status: Home / Self Care | Payer: Medicare PPO | Source: Ambulatory Visit | Attending: Internal Medicine | Admitting: Internal Medicine

## 2020-10-14 DIAGNOSIS — Z1231 Encounter for screening mammogram for malignant neoplasm of breast: Secondary | ICD-10-CM | POA: Diagnosis not present

## 2020-11-01 ENCOUNTER — Other Ambulatory Visit: Payer: Self-pay

## 2020-11-01 ENCOUNTER — Ambulatory Visit: Payer: Medicare PPO

## 2020-11-01 DIAGNOSIS — G4733 Obstructive sleep apnea (adult) (pediatric): Secondary | ICD-10-CM | POA: Diagnosis not present

## 2020-11-01 DIAGNOSIS — R0683 Snoring: Secondary | ICD-10-CM

## 2020-11-02 ENCOUNTER — Telehealth: Payer: Self-pay | Admitting: Pulmonary Disease

## 2020-11-02 NOTE — Telephone Encounter (Signed)
Ok to keep another night. Pt verbalized understanding & nothing further needded at this time.

## 2020-11-03 ENCOUNTER — Telehealth: Payer: Self-pay | Admitting: Pulmonary Disease

## 2020-11-03 DIAGNOSIS — G4733 Obstructive sleep apnea (adult) (pediatric): Secondary | ICD-10-CM | POA: Diagnosis not present

## 2020-11-03 NOTE — Telephone Encounter (Signed)
HST 11/02/20 >> AHI 31.8, SpO2 low 80%.   Please inform her that her sleep study shows severe obstructive sleep apnea.  Please arrange for ROV with me or NP to discuss treatment options.

## 2020-11-04 ENCOUNTER — Ambulatory Visit (INDEPENDENT_AMBULATORY_CARE_PROVIDER_SITE_OTHER): Payer: Medicare PPO

## 2020-11-04 ENCOUNTER — Ambulatory Visit: Payer: Medicare PPO

## 2020-11-04 ENCOUNTER — Other Ambulatory Visit: Payer: Self-pay

## 2020-11-04 DIAGNOSIS — E538 Deficiency of other specified B group vitamins: Secondary | ICD-10-CM

## 2020-11-04 MED ORDER — CYANOCOBALAMIN 1000 MCG/ML IJ SOLN
1000.0000 ug | Freq: Once | INTRAMUSCULAR | Status: AC
  Administered 2020-11-04: 1000 ug via INTRAMUSCULAR

## 2020-11-04 NOTE — Progress Notes (Addendum)
Patient presented for B 12 injection to left deltoid, patient voiced no concerns nor showed any signs of distress during injection.  Reviewed.  Dr [name] 

## 2020-11-05 NOTE — Telephone Encounter (Signed)
Called and went over HST result per Dr Craige Cotta with patient. All questions answered and patient expressed full understanding. Scheduled office visit with NP for Monday December 6th, 2021 at 9:30am at the [facility] office. Patient agreeable to time, date and location. Nothing further needed at this time.

## 2020-11-08 ENCOUNTER — Other Ambulatory Visit: Payer: Self-pay

## 2020-11-08 ENCOUNTER — Ambulatory Visit: Payer: Medicare PPO | Admitting: Pulmonary Disease

## 2020-11-08 ENCOUNTER — Encounter: Payer: Self-pay | Admitting: Pulmonary Disease

## 2020-11-08 VITALS — BP 130/82 | HR 72 | Temp 97.5°F | Ht 63.0 in | Wt 181.8 lb

## 2020-11-08 DIAGNOSIS — G4733 Obstructive sleep apnea (adult) (pediatric): Secondary | ICD-10-CM | POA: Diagnosis not present

## 2020-11-08 DIAGNOSIS — Z0000 Encounter for general adult medical examination without abnormal findings: Secondary | ICD-10-CM | POA: Insufficient documentation

## 2020-11-08 NOTE — Progress Notes (Signed)
@Patient  ID: , female    DOB: [birth year], 74 y.o.   MRN: [id number]  Chief Complaint  Patient presents with  . Follow-up    review of HST     Referring provider: [id number], MD  HPI:  74 year old female former smoker followed in our office for severe obstructive sleep apnea  PMH:.  GERD, hypertension, type 2 diabetes, anemia Smoker/ Smoking History: Former smoker.  Quit 1979.  7-pack-year smoking history Maintenance: None Pt of: Dr. 1980  11/08/2020  - Visit   74 year old female former smoker initially consulted with our practice in October/2021.  Patient completed a home sleep study in November/2021 showing severe obstructive sleep apnea.  She is presenting today with further evaluate and discuss treatment therapies.  Patient reports that both her brother and other family members have been started on CPAP therapy for obstructive sleep apnea.  She reports that 30 years ago her dentist told her that she likely had obstructive sleep apnea.    Questionaires / Pulmonary Flowsheets:   ACT:  No flowsheet data found.  MMRC: No flowsheet data found.  Epworth:  Results of the Epworth flowsheet 09/22/2020  Sitting and reading 3  Watching TV 3  Sitting, inactive in a public place (e.g. a theatre or a meeting) 1  As a passenger in a car for an hour without a break 1  Lying down to rest in the afternoon when circumstances permit 2  Sitting and talking to someone 1  Sitting quietly after a lunch without alcohol 1  In a car, while stopped for a few minutes in traffic 1  Total score 13    Tests:   FENO:  No results found for: NITRICOXIDE  PFT: No flowsheet data found.  WALK:  No flowsheet data found.  Imaging: MM 3D SCREEN BREAST BILATERAL  Result Date: 10/14/2020 CLINICAL DATA:  Screening. EXAM: DIGITAL SCREENING BILATERAL MAMMOGRAM WITH TOMO AND CAD COMPARISON:  Previous exam(s). ACR Breast Density Category c: The breast tissue is heterogeneously  dense, which may obscure small masses. FINDINGS: There are no findings suspicious for malignancy. Images were processed with CAD. IMPRESSION: No mammographic evidence of malignancy. A result letter of this screening mammogram will be mailed directly to the patient. RECOMMENDATION: Screening mammogram in one year. (Code:SM-B-01Y) BI-RADS CATEGORY  1: Negative. Electronically Signed   By: 13/10/2020 III M.D   On: 10/14/2020 11:11    Lab Results:  CBC    Component Value Date/Time   WBC 8.1 01/20/2020 0916   RBC 4.43 01/20/2020 0916   HGB 13.6 01/20/2020 0916   HCT 41.1 01/20/2020 0916   PLT 350.0 01/20/2020 0916   MCV 92.8 01/20/2020 0916   MCHC 33.1 01/20/2020 0916   RDW 14.2 01/20/2020 0916   LYMPHSABS 3.6 01/20/2020 0916   MONOABS 0.9 01/20/2020 0916   EOSABS 0.2 01/20/2020 0916   BASOSABS 0.1 01/20/2020 0916    BMET    Component Value Date/Time   NA 137 09/09/2020 0851   K 3.8 09/09/2020 0851   CL 102 09/09/2020 0851   CO2 30 09/09/2020 0851   GLUCOSE 118 (H) 09/09/2020 0851   BUN 12 09/09/2020 0851   CREATININE 0.85 09/09/2020 0851   CALCIUM 10.0 09/09/2020 0851    BNP No results found for: BNP  ProBNP No results found for: PROBNP  Specialty Problems      Pulmonary Problems   Asthma   Cough   OSA (obstructive sleep apnea)      Allergies  Allergen Reactions  . Cinnamon Other (See Comments)    Mouth irritant   . Bactrim [Sulfamethoxazole-Trimethoprim] Rash  . Shellfish Allergy Rash    Immunization History  Administered Date(s) Administered  . Fluad Quad(high Dose 65+) 08/28/2019, 09/22/2020  . Influenza, High Dose Seasonal PF 08/17/2016, 08/22/2017, 08/28/2018  . Influenza,inj,Quad PF,6+ Mos 07/29/2014  . Influenza-Unspecified 09/01/2013, 09/09/2015  . PFIZER SARS-COV-2 Vaccination 12/10/2019, 12/31/2019, 09/03/2020  . Pneumococcal Conjugate-13 12/01/2015  . Pneumococcal Polysaccharide-23 07/18/2011, 06/28/2017  . Tdap 05/19/2011  . Zoster  Recombinat (Shingrix) 06/28/2017    Past Medical History:  Diagnosis Date  . Diabetes mellitus without complication (HCC)   . GERD (gastroesophageal reflux disease)   . Hypercholesteremia   . Hypertension     Tobacco History: Social History   Tobacco Use  Smoking Status Former Smoker  . Packs/day: 1.00  . Years: 7.00  . Pack years: 7.00  . Types: Cigarettes  . Quit date: 12/04/1977  . Years since quitting: 42.9  Smokeless Tobacco Never Used   Counseling given: Not Answered   Continue to not smoke  Outpatient Encounter Medications as of 11/08/2020  Medication Sig  . albuterol (VENTOLIN HFA) 108 (90 Base) MCG/ACT inhaler Inhale 2 puffs into the lungs every 6 (six) hours as needed.  Marland Kitchen azelastine (ASTELIN) 0.1 % nasal spray 1 spray in the morning and at bedtime.  . conjugated estrogens (PREMARIN) vaginal cream Place 1 Applicatorful vaginally daily. Use pea sized amount M-W-Fr before bedtime (Patient taking differently: Place 1 Applicatorful vaginally daily as needed. Use pea sized amount M-W-Fr before bedtime)  . docusate sodium (COLACE) 100 MG capsule Take 1 capsule (100 mg total) by mouth every 12 (twelve) hours.  . fluticasone (FLONASE) 50 MCG/ACT nasal spray Place 2 sprays into both nostrils daily.  Marland Kitchen guaiFENesin (MUCINEX) 600 MG 12 hr tablet Take 2 tablets (1,200 mg total) by mouth 2 (two) times daily as needed for cough or to loosen phlegm.  . hydrochlorothiazide (HYDRODIURIL) 25 MG tablet TAKE 1 TABLET(25 MG) BY MOUTH DAILY  . meclizine (ANTIVERT) 25 MG tablet Take 1 tablet (25 mg total) by mouth 3 (three) times daily as needed for dizziness.  . nystatin cream (MYCOSTATIN) Apply 1 application topically 2 (two) times daily.  . pantoprazole (PROTONIX) 40 MG tablet TAKE 1 TABLET(40 MG) BY MOUTH DAILY  . pravastatin (PRAVACHOL) 20 MG tablet TAKE 1 TABLET(20 MG) BY MOUTH DAILY  . benzonatate (TESSALON) 100 MG capsule Take 1 capsule (100 mg total) by mouth 3 (three) times daily as  needed for cough. (Patient not taking: Reported on 11/08/2020)  . loratadine (CLARITIN) 10 MG tablet Take 10 mg by mouth daily. (Patient not taking: Reported on 11/08/2020)  . WIXELA INHUB 100-50 MCG/DOSE AEPB INHALE 1 PUFF BY MOUTH TWICE DAILY (Patient not taking: Reported on 11/08/2020)   No facility-administered encounter medications on file as of 11/08/2020.     Review of Systems  Review of Systems  Constitutional: Negative for activity change, fatigue and fever.  HENT: Negative for sinus pressure, sinus pain and sore throat.   Respiratory: Negative for cough, shortness of breath and wheezing.   Cardiovascular: Negative for chest pain and palpitations.  Gastrointestinal: Negative for diarrhea, nausea and vomiting.  Musculoskeletal: Negative for arthralgias.  Neurological: Negative for dizziness.  Psychiatric/Behavioral: Negative for sleep disturbance. The patient is not nervous/anxious.      Physical Exam  BP 130/82 (BP Location: Left Arm, Cuff Size: Normal)   Pulse 72   Temp (!) 97.5 F (36.4 C)  Ht 5\' 3"  (1.6 m)   Wt 181 lb 12.8 oz (82.5 kg)   SpO2 98%   BMI 32.20 kg/m   Wt Readings from Last 5 Encounters:  11/08/20 181 lb 12.8 oz (82.5 kg)  09/22/20 180 lb 9.6 oz (81.9 kg)  09/13/20 175 lb (79.4 kg)  05/14/20 183 lb 3.2 oz (83.1 kg)  03/24/20 181 lb 14.4 oz (82.5 kg)    BMI Readings from Last 5 Encounters:  11/08/20 32.20 kg/m  09/22/20 33.03 kg/m  09/13/20 31.00 kg/m  05/14/20 32.45 kg/m  03/24/20 32.22 kg/m     Physical Exam Vitals and nursing note reviewed.  Constitutional:      General: She is not in acute distress.    Appearance: Normal appearance. She is obese.  HENT:     Head: Normocephalic and atraumatic.     Right Ear: Tympanic membrane, ear canal and external ear normal. There is no impacted cerumen.     Left Ear: Tympanic membrane, ear canal and external ear normal. There is no impacted cerumen.     Ears:     Comments: Likely eustachian  tube dysfunction in right ear    Nose: Nose normal. No congestion.     Mouth/Throat:     Mouth: Mucous membranes are moist.     Pharynx: Oropharynx is clear.     Comments: Mallampati 4 Eyes:     Pupils: Pupils are equal, round, and reactive to light.  Cardiovascular:     Rate and Rhythm: Normal rate and regular rhythm.     Pulses: Normal pulses.     Heart sounds: Normal heart sounds. No murmur heard.   Pulmonary:     Effort: Pulmonary effort is normal. No respiratory distress.     Breath sounds: Normal breath sounds. No decreased air movement. No decreased breath sounds, wheezing or rales.  Musculoskeletal:     Cervical back: Normal range of motion.  Skin:    General: Skin is warm and dry.     Capillary Refill: Capillary refill takes less than 2 seconds.  Neurological:     General: No focal deficit present.     Mental Status: She is alert and oriented to person, place, and time. Mental status is at baseline.     Gait: Gait normal.  Psychiatric:        Mood and Affect: Mood normal.        Behavior: Behavior normal.        Thought Content: Thought content normal.        Judgment: Judgment normal.       Assessment & Plan:   OSA (obstructive sleep apnea) Plan: Start CPAP therapy Order placed to [employer] Patient to contact her office when she receives her CPAP Message sent to [employer] point of care team to notify the patient no severe obstructive sleep apnea needs to be triaged appropriately for starting APAP setting 5-15 Mask of choice   Healthcare maintenance Likely eustachian tube dysfunction Keep close follow-up with primary care Can also follow back up with established ENT Continue daily antihistamine and Astelin nasal spray Can start nasal saline rinses    Return in about 3 months (around 02/06/2021), or if symptoms worsen or fail to improve, for [facility].   [facility], NP 11/08/2020   This appointment required 34 minutes of  patient care (this includes precharting, chart review, review of results, face-to-face care, etc.).

## 2020-11-08 NOTE — Assessment & Plan Note (Signed)
Likely eustachian tube dysfunction Keep close follow-up with primary care Can also follow back up with established ENT Continue daily antihistamine and Astelin nasal spray Can start nasal saline rinses

## 2020-11-08 NOTE — Patient Instructions (Addendum)
You were seen today by Lisa Ceo, NP  for:   1. OSA (obstructive sleep apnea)  We recommend that you start using your CPAP daily >>>Keep up the hard work using your device >>> Goal should be wearing this for the entire night that you are sleeping, at least 4 to 6 hours  Remember:  . Do not drive or operate heavy machinery if tired or drowsy.  . Please notify the supply company and office if you are unable to use your device regularly due to missing supplies or machine being broken.  . Work on maintaining a healthy weight and following your recommended nutrition plan  . Maintain proper daily exercise and movement  . Maintaining proper use of your device can also help improve management of other chronic illnesses such as: Blood pressure, blood sugars, and weight management.   BiPAP/ CPAP Cleaning:  >>>Clean weekly, with Dawn soap, and bottle brush.  Set up to air dry. >>> Wipe mask out daily with wet wipe or towelette   2. Healthcare maintenance  Great job being up to date with your vaccinations   Continue follow up with Dr. Lorin Picket    Follow Up:    Return in about 3 months (around 02/06/2021), or if symptoms worsen or fail to improve, for [facility].   Notification of test results are managed in the following manner: If there are  any recommendations or changes to the  plan of care discussed in office today,  we will contact you and let you know what they are. If you do not hear from [place], then your results are normal and you can view them through your  MyChart account , or a letter will be sent to you. Thank you again for trusting [place] with your care  - Thank you, [facility]    It is flu season:   >>> Best ways to protect herself from the flu: Receive the yearly flu vaccine, practice good hand hygiene washing with soap and also using hand sanitizer when available, eat a nutritious meals, get adequate rest, hydrate appropriately       Please contact  the office if your symptoms worsen or you have concerns that you are not improving.   Thank you for choosing [facility] for your healthcare, and for allowing [place] to partner with you on your healthcare journey. I am thankful to be able to provide care to you today.   Lisa Headland FNP-C    Sleep Apnea Sleep apnea affects breathing during sleep. It causes breathing to stop for a short time or to become shallow. It can also increase the risk of:  Heart attack.  Stroke.  Being very overweight (obese).  Diabetes.  Heart failure.  Irregular heartbeat. The goal of treatment is to help you breathe normally again. What are the causes? There are three kinds of sleep apnea:  Obstructive sleep apnea. This is caused by a blocked or collapsed airway.  Central sleep apnea. This happens when the brain does not send the right signals to the muscles that control breathing.  Mixed sleep apnea. This is a combination of obstructive and central sleep apnea. The most common cause of this condition is a collapsed or blocked airway. This can happen if:  Your throat muscles are too relaxed.  Your tongue and tonsils are too large.  You are overweight.  Your airway is too small. What increases the risk?  Being overweight.  Smoking.  Having a small airway.  Being older.  Being female.  Drinking alcohol.  Taking medicines to calm yourself (sedatives or tranquilizers).  Having family members with the condition. What are the signs or symptoms?  Trouble staying asleep.  Being sleepy or tired during the day.  Getting angry a lot.  Loud snoring.  Headaches in the morning.  Not being able to focus your mind (concentrate).  Forgetting things.  Less interest in sex.  Mood swings.  Personality changes.  Feelings of sadness (depression).  Waking up a lot during the night to pee (urinate).  Dry mouth.  Sore throat. How is this diagnosed?  Your medical  history.  A physical exam.  A test that is done when you are sleeping (sleep study). The test is most often done in a sleep lab but may also be done at home. How is this treated?   Sleeping on your side.  Using a medicine to get rid of mucus in your nose (decongestant).  Avoiding the use of alcohol, medicines to help you relax, or certain pain medicines (narcotics).  Losing weight, if needed.  Changing your diet.  Not smoking.  Using a machine to open your airway while you sleep, such as: ? An oral appliance. This is a mouthpiece that shifts your lower jaw forward. ? A CPAP device. This device blows air through a mask when you breathe out (exhale). ? An EPAP device. This has valves that you put in each nostril. ? A BPAP device. This device blows air through a mask when you breathe in (inhale) and breathe out.  Having surgery if other treatments do not work. It is important to get treatment for sleep apnea. Without treatment, it can lead to:  High blood pressure.  Coronary artery disease.  In men, not being able to have an erection (impotence).  Reduced thinking ability. Follow these instructions at home: Lifestyle  Make changes that your doctor recommends.  Eat a healthy diet.  Lose weight if needed.  Avoid alcohol, medicines to help you relax, and some pain medicines.  Do not use any products that contain nicotine or tobacco, such as cigarettes, e-cigarettes, and chewing tobacco. If you need help quitting, ask your doctor. General instructions  Take over-the-counter and prescription medicines only as told by your doctor.  If you were given a machine to use while you sleep, use it only as told by your doctor.  If you are having surgery, make sure to tell your doctor you have sleep apnea. You may need to bring your device with you.  Keep all follow-up visits as told by your doctor. This is important. Contact a doctor if:  The machine that you were given to  use during sleep bothers you or does not seem to be working.  You do not get better.  You get worse. Get help right away if:  Your chest hurts.  You have trouble breathing in enough air.  You have an uncomfortable feeling in your back, arms, or stomach.  You have trouble talking.  One side of your body feels weak.  A part of your face is hanging down. These symptoms may be an emergency. Do not wait to see if the symptoms will go away. Get medical help right away. Call your local emergency services (911 in the [place] Do not drive yourself to the hospital. Summary  This condition affects breathing during sleep.  The most common cause is a collapsed or blocked airway.  The goal of treatment is to help you  breathe normally while you sleep. This information is not intended to replace advice given to you by your health care provider. Make sure you discuss any questions you have with your health care provider. Document Revised: 09/06/2018 Document Reviewed: 07/16/2018 Elsevier Patient Education  2020 Elsevier Inc.    CPAP and BPAP Information CPAP and BPAP are methods of helping a person breathe with the use of air pressure. CPAP stands for "continuous positive airway pressure." BPAP stands for "bi-level positive airway pressure." In both methods, air is blown through your nose or mouth and into your air passages to help you breathe well. CPAP and BPAP use different amounts of pressure to blow air. With CPAP, the amount of pressure stays the same while you breathe in and out. With BPAP, the amount of pressure is increased when you breathe in (inhale) so that you can take larger breaths. Your health care provider will recommend whether CPAP or BPAP would be more helpful for you. Why are CPAP and BPAP treatments used? CPAP or BPAP can be helpful if you have:  Sleep apnea.  Chronic obstructive pulmonary disease (COPD).  Heart failure.  Medical conditions that weaken the muscles of  the chest including muscular dystrophy, or neurological diseases such as amyotrophic lateral sclerosis (ALS).  Other problems that cause breathing to be weak, abnormal, or difficult. CPAP is most commonly used for obstructive sleep apnea (OSA) to keep the airways from collapsing when the muscles relax during sleep. How is CPAP or BPAP administered? Both CPAP and BPAP are provided by a small machine with a flexible plastic tube that attaches to a plastic mask. You wear the mask. Air is blown through the mask into your nose or mouth. The amount of pressure that is used to blow the air can be adjusted on the machine. Your health care provider will determine the pressure setting that should be used based on your individual needs. When should CPAP or BPAP be used? In most cases, the mask only needs to be worn during sleep. Generally, the mask needs to be worn throughout the night and during any daytime naps. People with certain medical conditions may also need to wear the mask at other times when they are awake. Follow instructions from your health care provider about when to use the machine. What are some tips for using the mask?   Because the mask needs to be snug, some people feel trapped or closed-in (claustrophobic) when first using the mask. If you feel this way, you may need to get used to the mask. One way to do this is by holding the mask loosely over your nose or mouth and then gradually applying the mask more snugly. You can also gradually increase the amount of time that you use the mask.  Masks are available in various types and sizes. Some fit over your mouth and nose while others fit over just your nose. If your mask does not fit well, talk with your health care provider about getting a different one.  If you are using a mask that fits over your nose and you tend to breathe through your mouth, a chin strap may be applied to help keep your mouth closed.  The CPAP and BPAP machines have  alarms that may sound if the mask comes off or develops a leak.  If you have trouble with the mask, it is very important that you talk with your health care provider about finding a way to make the mask easier to  tolerate. Do not stop using the mask. Stopping the use of the mask could have a negative impact on your health. What are some tips for using the machine?  Place your CPAP or BPAP machine on a secure table or stand near an electrical outlet.  Know where the on/off switch is located on the machine.  Follow instructions from your health care provider about how to set the pressure on your machine and when you should use it.  Do not eat or drink while the CPAP or BPAP machine is on. Food or fluids could get pushed into your lungs by the pressure of the CPAP or BPAP.  Do not smoke. Tobacco smoke residue can damage the machine.  For home use, CPAP and BPAP machines can be rented or purchased through home health care companies. Many different brands of machines are available. Renting a machine before purchasing may help you find out which particular machine works well for you.  Keep the CPAP or BPAP machine and attachments clean. Ask your health care provider for specific instructions. Get help right away if:  You have redness or open areas around your nose or mouth where the mask fits.  You have trouble using the CPAP or BPAP machine.  You cannot tolerate wearing the CPAP or BPAP mask.  You have pain, discomfort, and bloating in your abdomen. Summary  CPAP and BPAP are methods of helping a person breathe with the use of air pressure.  Both CPAP and BPAP are provided by a small machine with a flexible plastic tube that attaches to a plastic mask.  If you have trouble with the mask, it is very important that you talk with your health care provider about finding a way to make the mask easier to tolerate. This information is not intended to replace advice given to you by your health  care provider. Make sure you discuss any questions you have with your health care provider. Document Revised: 03/12/2019 Document Reviewed: 10/09/2016 Elsevier Patient Education  2020 [employer]

## 2020-11-08 NOTE — Progress Notes (Signed)
Reviewed and agree with assessment/plan.   Araiyah Cumpton, MD [facility] 11/08/2020, 10:40 AM Pager:  [phone]  

## 2020-11-08 NOTE — Addendum Note (Signed)
Addended by: Dorisann Frames R on: 11/08/2020 10:40 AM   Modules accepted: Orders

## 2020-11-08 NOTE — Assessment & Plan Note (Signed)
Plan: Start CPAP therapy Order placed to [facility] Patient to contact her office when she receives her CPAP Message sent to [employer] point of care team to notify the patient no severe obstructive sleep apnea needs to be triaged appropriately for starting APAP setting 5-15 Mask of choice

## 2020-12-02 ENCOUNTER — Other Ambulatory Visit: Payer: Self-pay | Admitting: Internal Medicine

## 2020-12-07 ENCOUNTER — Ambulatory Visit (INDEPENDENT_AMBULATORY_CARE_PROVIDER_SITE_OTHER): Payer: Medicare PPO

## 2020-12-07 ENCOUNTER — Other Ambulatory Visit: Payer: Self-pay

## 2020-12-07 DIAGNOSIS — E538 Deficiency of other specified B group vitamins: Secondary | ICD-10-CM

## 2020-12-07 MED ORDER — CYANOCOBALAMIN 1000 MCG/ML IJ SOLN
1000.0000 ug | Freq: Once | INTRAMUSCULAR | Status: AC
  Administered 2020-12-07: 1000 ug via INTRAMUSCULAR

## 2020-12-07 NOTE — Progress Notes (Signed)
Patient presented for B 12 injection to left deltoid, patient voiced no concerns nor showed any signs of distress during injection. 

## 2020-12-24 ENCOUNTER — Other Ambulatory Visit: Payer: Self-pay | Admitting: Internal Medicine

## 2021-01-12 DIAGNOSIS — G4733 Obstructive sleep apnea (adult) (pediatric): Secondary | ICD-10-CM | POA: Diagnosis not present

## 2021-01-13 ENCOUNTER — Ambulatory Visit (INDEPENDENT_AMBULATORY_CARE_PROVIDER_SITE_OTHER): Payer: Medicare PPO

## 2021-01-13 ENCOUNTER — Other Ambulatory Visit: Payer: Self-pay

## 2021-01-13 ENCOUNTER — Other Ambulatory Visit (INDEPENDENT_AMBULATORY_CARE_PROVIDER_SITE_OTHER): Payer: Medicare PPO

## 2021-01-13 DIAGNOSIS — I10 Essential (primary) hypertension: Secondary | ICD-10-CM | POA: Diagnosis not present

## 2021-01-13 DIAGNOSIS — E119 Type 2 diabetes mellitus without complications: Secondary | ICD-10-CM | POA: Diagnosis not present

## 2021-01-13 DIAGNOSIS — E538 Deficiency of other specified B group vitamins: Secondary | ICD-10-CM

## 2021-01-13 DIAGNOSIS — E7800 Pure hypercholesterolemia, unspecified: Secondary | ICD-10-CM | POA: Diagnosis not present

## 2021-01-13 LAB — CBC WITH DIFFERENTIAL/PLATELET
Basophils Absolute: 0 10*3/uL (ref 0.0–0.1)
Basophils Relative: 0.6 % (ref 0.0–3.0)
Eosinophils Absolute: 0.1 10*3/uL (ref 0.0–0.7)
Eosinophils Relative: 1.8 % (ref 0.0–5.0)
HCT: 40 % (ref 36.0–46.0)
Hemoglobin: 13.1 g/dL (ref 12.0–15.0)
Lymphocytes Relative: 37.9 % (ref 12.0–46.0)
Lymphs Abs: 3.1 10*3/uL (ref 0.7–4.0)
MCHC: 32.9 g/dL (ref 30.0–36.0)
MCV: 93.3 fl (ref 78.0–100.0)
Monocytes Absolute: 0.9 10*3/uL (ref 0.1–1.0)
Monocytes Relative: 11.6 % (ref 3.0–12.0)
Neutro Abs: 3.9 10*3/uL (ref 1.4–7.7)
Neutrophils Relative %: 48.1 % (ref 43.0–77.0)
Platelets: 300 10*3/uL (ref 150.0–400.0)
RBC: 4.28 Mil/uL (ref 3.87–5.11)
RDW: 13.9 % (ref 11.5–15.5)
WBC: 8.1 10*3/uL (ref 4.0–10.5)

## 2021-01-13 LAB — BASIC METABOLIC PANEL
BUN: 13 mg/dL (ref 6–23)
CO2: 32 mEq/L (ref 19–32)
Calcium: 10.2 mg/dL (ref 8.4–10.5)
Chloride: 101 mEq/L (ref 96–112)
Creatinine, Ser: 0.82 mg/dL (ref 0.40–1.20)
GFR: 69.38 mL/min (ref >60.00)
Glucose, Bld: 112 mg/dL — ABNORMAL HIGH (ref 70–99)
Potassium: 4.2 mEq/L (ref 3.5–5.1)
Sodium: 139 mEq/L (ref 135–145)

## 2021-01-13 LAB — LIPID PANEL
Cholesterol: 198 mg/dL (ref 0–200)
HDL: 54.1 mg/dL (ref >39.00)
LDL Cholesterol: 122 mg/dL — ABNORMAL HIGH (ref 0–99)
NonHDL: 144.04
Total CHOL/HDL Ratio: 4
Triglycerides: 110 mg/dL (ref 0.0–149.0)
VLDL: 22 mg/dL (ref 0.0–40.0)

## 2021-01-13 LAB — HEPATIC FUNCTION PANEL
ALT: 12 U/L (ref 0–35)
AST: 17 U/L (ref 0–37)
Albumin: 4 g/dL (ref 3.5–5.2)
Alkaline Phosphatase: 77 U/L (ref 39–117)
Bilirubin, Direct: 0.1 mg/dL (ref 0.0–0.3)
Total Bilirubin: 0.6 mg/dL (ref 0.2–1.2)
Total Protein: 7.1 g/dL (ref 6.0–8.3)

## 2021-01-13 LAB — TSH: TSH: 2.04 u[IU]/mL (ref 0.35–4.50)

## 2021-01-13 LAB — HEMOGLOBIN A1C: Hgb A1c MFr Bld: 6.1 % (ref 4.6–6.5)

## 2021-01-13 MED ORDER — CYANOCOBALAMIN 1000 MCG/ML IJ SOLN
1000.0000 ug | Freq: Once | INTRAMUSCULAR | Status: AC
  Administered 2021-01-13: 1000 ug via INTRAMUSCULAR

## 2021-01-13 NOTE — Progress Notes (Addendum)
Patient presented for B 12 injection to left deltoid, patient voiced no concerns nor showed any signs of distress during injection. 

## 2021-01-17 ENCOUNTER — Encounter: Payer: Self-pay | Admitting: Internal Medicine

## 2021-01-17 ENCOUNTER — Telehealth (INDEPENDENT_AMBULATORY_CARE_PROVIDER_SITE_OTHER): Payer: Medicare PPO | Admitting: Internal Medicine

## 2021-01-17 DIAGNOSIS — I10 Essential (primary) hypertension: Secondary | ICD-10-CM | POA: Diagnosis not present

## 2021-01-17 DIAGNOSIS — K219 Gastro-esophageal reflux disease without esophagitis: Secondary | ICD-10-CM

## 2021-01-17 DIAGNOSIS — D649 Anemia, unspecified: Secondary | ICD-10-CM | POA: Diagnosis not present

## 2021-01-17 DIAGNOSIS — E119 Type 2 diabetes mellitus without complications: Secondary | ICD-10-CM

## 2021-01-17 DIAGNOSIS — J4520 Mild intermittent asthma, uncomplicated: Secondary | ICD-10-CM | POA: Diagnosis not present

## 2021-01-17 DIAGNOSIS — E7800 Pure hypercholesterolemia, unspecified: Secondary | ICD-10-CM

## 2021-01-17 DIAGNOSIS — G4733 Obstructive sleep apnea (adult) (pediatric): Secondary | ICD-10-CM

## 2021-01-17 NOTE — Progress Notes (Signed)
Patient ID: Lisa Robles, female   DOB: 1946/04/05, 75 y.o.   MRN: [id number]   Virtual Visit via video Note  This visit type was conducted due to national recommendations for restrictions regarding the COVID-19 pandemic (e.g. social distancing).  This format is felt to be most appropriate for this patient at this time.  All issues noted in this document were discussed and addressed.  No physical exam was performed (except for noted visual exam findings with Video Visits).   I connected with Lisa Robles by a video enabled telemedicine application and verified that I am speaking with the correct person using two identifiers. Location patient: home Location provider: work  Persons participating in the virtual visit: patient, provider  The limitations, risks, security and privacy concerns of performing an evaluation and management service by video and the availability of in person appointments have been discussed.  It has also been discussed with the patient that there may be a patient responsible charge related to this service. The patient expressed understanding and agreed to proceed.   Reason for visit: follow up appt   HPI: Follow up appt - f/u regarding her blood pressure, blood sugar and cholesterol.  Family had covid.  She quarantined.  She did not get sick.  Trying to stay active.  No chest pain or sob reported.  Some occasional allergy issues, but no acute symptoms now.  No acid reflux.  No abdominal pain reported. Takes stool softeners to help keep bowels moving.  Has OSA - CPAP.  Going today for reevaluation.     ROS: See pertinent positives and negatives per HPI.  Past Medical History:  Diagnosis Date  . Diabetes mellitus without complication ([place])   . GERD (gastroesophageal reflux disease)   . Hypercholesteremia   . Hypertension     Past Surgical History:  Procedure Laterality Date  . BREAST BIOPSY    . TOOTH EXTRACTION     tooth implantation  . TUBAL LIGATION  1979     Family History  Problem Relation Age of Onset  . Heart attack Mother   . Heart attack Father   . Hypercholesterolemia Brother   . Diabetes Brother   . Cancer Maternal Aunt        breast  . Diabetes Maternal Grandmother   . Colon cancer Neg Hx   . Breast cancer Neg Hx     SOCIAL HX: reviewed.    Current Outpatient Medications:  .  albuterol (VENTOLIN HFA) 108 (90 Base) MCG/ACT inhaler, Inhale 2 puffs into the lungs every 6 (six) hours as needed., Disp: 18 g, Rfl: 2 .  azelastine (ASTELIN) 0.1 % nasal spray, 1 spray in the morning and at bedtime., Disp: , Rfl:  .  benzonatate (TESSALON) 100 MG capsule, Take 1 capsule (100 mg total) by mouth 3 (three) times daily as needed for cough. (Patient not taking: No sig reported), Disp: 21 capsule, Rfl: 0 .  conjugated estrogens (PREMARIN) vaginal cream, Place 1 Applicatorful vaginally daily. Use pea sized amount M-W-Fr before bedtime (Patient taking differently: Place 1 Applicatorful vaginally daily as needed. Use pea sized amount M-W-Fr before bedtime), Disp: 42.5 g, Rfl: 12 .  docusate sodium (COLACE) 100 MG capsule, Take 1 capsule (100 mg total) by mouth every 12 (twelve) hours., Disp: 60 capsule, Rfl: 0 .  fluticasone (FLONASE) 50 MCG/ACT nasal spray, Place 2 sprays into both nostrils daily., Disp: 16 g, Rfl: 1 .  guaiFENesin (MUCINEX) 600 MG 12 hr tablet, Take 2 tablets (1,200 mg  total) by mouth 2 (two) times daily as needed for cough or to loosen phlegm., Disp: 20 tablet, Rfl: 0 .  hydrochlorothiazide (HYDRODIURIL) 25 MG tablet, TAKE 1 TABLET(25 MG) BY MOUTH DAILY, Disp: 90 tablet, Rfl: 1 .  loratadine (CLARITIN) 10 MG tablet, Take 10 mg by mouth daily. (Patient not taking: No sig reported), Disp: , Rfl:  .  meclizine (ANTIVERT) 25 MG tablet, Take 1 tablet (25 mg total) by mouth 3 (three) times daily as needed for dizziness., Disp: 30 tablet, Rfl: 0 .  nystatin cream (MYCOSTATIN), Apply 1 application topically 2 (two) times daily., Disp:  60 g, Rfl: 0 .  pantoprazole (PROTONIX) 40 MG tablet, TAKE 1 TABLET(40 MG) BY MOUTH DAILY, Disp: 30 tablet, Rfl: 2 .  pravastatin (PRAVACHOL) 20 MG tablet, TAKE 1 TABLET(20 MG) BY MOUTH DAILY, Disp: 90 tablet, Rfl: 1 .  WIXELA INHUB 100-50 MCG/DOSE AEPB, INHALE 1 PUFF BY MOUTH TWICE DAILY (Patient not taking: Reported on 11/08/2020), Disp: 60 each, Rfl: 2  EXAM:  GENERAL: alert, oriented, appears well and in no acute distress  HEENT: atraumatic, conjunttiva clear, no obvious abnormalities on inspection of external nose and ears  NECK: normal movements of the head and neck  LUNGS: on inspection no signs of respiratory distress, breathing rate appears normal, no obvious gross SOB, gasping or wheezing  CV: no obvious cyanosis  PSYCH/NEURO: pleasant and cooperative, no obvious depression or anxiety, speech and thought processing grossly intact  ASSESSMENT AND PLAN:  Discussed the following assessment and plan:  Problem List Items Addressed This Visit    Anemia    Follow cbc.       Asthma    Breathing stable.  Has ventolin if needed. Follow.       Diabetes mellitus without complication (HCC)    Low carb diet and exercise.  Follow met b and a1c.       Relevant Orders   Hemoglobin S1U   Basic metabolic panel   GERD (gastroesophageal reflux disease)    No acid reflux symptoms.  On protonix.       Hypercholesterolemia    On pravastatin.  Low cholesterol diet and exercise.  Follow lipid panel and liver function tests.        Relevant Orders   Hepatic function panel   Lipid panel   Hypertension    Continue hctz.  Follow pressures.  Follow metabolic panel.       OSA (obstructive sleep apnea)    CPAP.  Reevaluation today.           I discussed the assessment and treatment plan with the patient. The patient was provided an opportunity to ask questions and all were answered. The patient agreed with the plan and demonstrated an understanding of the instructions.   The  patient was advised to call back or seek an in-person evaluation if the symptoms worsen or if the condition fails to improve as anticipated.   Einar Pheasant, MD

## 2021-01-18 DIAGNOSIS — H25013 Cortical age-related cataract, bilateral: Secondary | ICD-10-CM | POA: Diagnosis not present

## 2021-01-19 DIAGNOSIS — G4733 Obstructive sleep apnea (adult) (pediatric): Secondary | ICD-10-CM | POA: Diagnosis not present

## 2021-01-30 ENCOUNTER — Encounter: Payer: Self-pay | Admitting: Internal Medicine

## 2021-01-30 NOTE — Assessment & Plan Note (Signed)
Continue hctz.  Follow pressures.  Follow metabolic panel.  

## 2021-01-30 NOTE — Assessment & Plan Note (Signed)
CPAP.  Reevaluation today.

## 2021-01-30 NOTE — Assessment & Plan Note (Signed)
Follow cbc.  

## 2021-01-30 NOTE — Assessment & Plan Note (Signed)
No acid reflux symptoms.  On protonix.

## 2021-01-30 NOTE — Assessment & Plan Note (Signed)
On pravastatin.  Low cholesterol diet and exercise.  Follow lipid panel and liver function tests.   

## 2021-01-30 NOTE — Assessment & Plan Note (Signed)
Low carb diet and exercise.  Follow met b and a1c.  

## 2021-01-30 NOTE — Assessment & Plan Note (Signed)
Breathing stable. Has ventolin if needed.  Follow.   

## 2021-02-09 DIAGNOSIS — G4733 Obstructive sleep apnea (adult) (pediatric): Secondary | ICD-10-CM | POA: Diagnosis not present

## 2021-02-26 DIAGNOSIS — I10 Essential (primary) hypertension: Secondary | ICD-10-CM | POA: Diagnosis not present

## 2021-02-26 DIAGNOSIS — R55 Syncope and collapse: Secondary | ICD-10-CM | POA: Diagnosis not present

## 2021-02-26 DIAGNOSIS — D72829 Elevated white blood cell count, unspecified: Secondary | ICD-10-CM | POA: Diagnosis not present

## 2021-02-26 DIAGNOSIS — Z87891 Personal history of nicotine dependence: Secondary | ICD-10-CM | POA: Diagnosis not present

## 2021-02-26 DIAGNOSIS — Z23 Encounter for immunization: Secondary | ICD-10-CM | POA: Diagnosis not present

## 2021-02-26 DIAGNOSIS — S00212A Abrasion of left eyelid and periocular area, initial encounter: Secondary | ICD-10-CM | POA: Diagnosis not present

## 2021-02-26 DIAGNOSIS — R42 Dizziness and giddiness: Secondary | ICD-10-CM | POA: Diagnosis not present

## 2021-03-09 ENCOUNTER — Ambulatory Visit (INDEPENDENT_AMBULATORY_CARE_PROVIDER_SITE_OTHER): Payer: Medicare PPO

## 2021-03-09 VITALS — Ht 63.0 in | Wt 180.0 lb

## 2021-03-09 DIAGNOSIS — Z0000 Encounter for general adult medical examination without abnormal findings: Secondary | ICD-10-CM

## 2021-03-09 DIAGNOSIS — Z1211 Encounter for screening for malignant neoplasm of colon: Secondary | ICD-10-CM

## 2021-03-09 NOTE — Patient Instructions (Addendum)
Lisa Robles , Thank you for taking time to come for your Medicare Wellness Visit. I appreciate your ongoing commitment to your health goals. Please review the following plan we discussed and let me know if I can assist you in the future.   These are the goals we discussed: Goals      Patient Stated   .  I would like to lose weight (pt-stated)      Weight goal 155lb Stay active Stay hydrated Healthy diet       This is a list of the screening recommended for you and due dates:  Health Maintenance  Topic Date Due  . Complete foot exam   05/30/2021*  . Tetanus Vaccine  05/18/2021  . Flu Shot  07/04/2021  . Hemoglobin A1C  07/13/2021  . Urine Protein Check  09/09/2021  . Eye exam for diabetics  02/15/2022  . DEXA scan (bone density measurement)  Completed  . COVID-19 Vaccine  Completed  .  Hepatitis C: One time screening is recommended by Center for Disease Control  (CDC) for  adults born from [birth year] through 1967.   Completed  . Pneumonia vaccines  Completed  . HPV Vaccine  Aged Out  *Topic was postponed. The date shown is not the original due date.    Immunizations Immunization History  Administered Date(s) Administered  . Fluad Quad(high Dose 65+) 08/28/2019, 09/22/2020  . Influenza, High Dose Seasonal PF 08/17/2016, 08/22/2017, 08/28/2018  . Influenza,inj,Quad PF,6+ Mos 07/29/2014  . Influenza-Unspecified 09/01/2013, 09/09/2015  . PFIZER(Purple Top)SARS-COV-2 Vaccination 12/10/2019, 12/31/2019, 09/03/2020  . Pneumococcal Conjugate-13 12/01/2015  . Pneumococcal Polysaccharide-23 07/18/2011, 06/28/2017  . Tdap 05/19/2011  . Zoster Recombinat (Shingrix) 06/28/2017   Keep all routine maintenance appointments.   Next scheduled lab 05/26/21   Cpe 05/30/21   Advanced directives: End of life planning; Advance aging; Advanced directives discussed.  Copy of current HCPOA/Living Will requested.    Conditions/risks identified: none new  Cologuard ordered.   Follow up in one  year for your annual wellness visit   Preventive Care 65 Years and Older, Female Preventive care refers to lifestyle choices and visits with your health care provider that can promote health and wellness. What does preventive care include?  A yearly physical exam. This is also called an annual well check.  Dental exams once or twice a year.  Routine eye exams. Ask your health care provider how often you should have your eyes checked.  Personal lifestyle choices, including:  Daily care of your teeth and gums.  Regular physical activity.  Eating a healthy diet.  Avoiding tobacco and drug use.  Limiting alcohol use.  Practicing safe sex.  Taking low-dose aspirin every day.  Taking vitamin and mineral supplements as recommended by your health care provider. What happens during an annual well check? The services and screenings done by your health care provider during your annual well check will depend on your age, overall health, lifestyle risk factors, and family history of disease. Counseling  Your health care provider may ask you questions about your:  Alcohol use.  Tobacco use.  Drug use.  Emotional well-being.  Home and relationship well-being.  Sexual activity.  Eating habits.  History of falls.  Memory and ability to understand (cognition).  Work and work Astronomer.  Reproductive health. Screening  You may have the following tests or measurements:  Height, weight, and BMI.  Blood pressure.  Lipid and cholesterol levels. These may be checked every 5 years, or more frequently if you  are over 99 years old.  Skin check.  Lung cancer screening. You may have this screening every year starting at age 55 if you have a 30-pack-year history of smoking and currently smoke or have quit within the past 15 years.  Fecal occult blood test (FOBT) of the stool. You may have this test every year starting at age 52.  Flexible sigmoidoscopy or colonoscopy. You  may have a sigmoidoscopy every 5 years or a colonoscopy every 10 years starting at age 20.  Hepatitis C blood test.  Hepatitis B blood test.  Sexually transmitted disease (STD) testing.  Diabetes screening. This is done by checking your blood sugar (glucose) after you have not eaten for a while (fasting). You may have this done every 1-3 years.  Bone density scan. This is done to screen for osteoporosis. You may have this done starting at age 40.  Mammogram. This may be done every 1-2 years. Talk to your health care provider about how often you should have regular mammograms. Talk with your health care provider about your test results, treatment options, and if necessary, the need for more tests. Vaccines  Your health care provider may recommend certain vaccines, such as:  Influenza vaccine. This is recommended every year.  Tetanus, diphtheria, and acellular pertussis (Tdap, Td) vaccine. You may need a Td booster every 10 years.  Zoster vaccine. You may need this after age 18.  Pneumococcal 13-valent conjugate (PCV13) vaccine. One dose is recommended after age 41.  Pneumococcal polysaccharide (PPSV23) vaccine. One dose is recommended after age 78. Talk to your health care provider about which screenings and vaccines you need and how often you need them. This information is not intended to replace advice given to you by your health care provider. Make sure you discuss any questions you have with your health care provider. Document Released: 12/17/2015 Document Revised: 08/09/2016 Document Reviewed: 09/21/2015 Elsevier Interactive Patient Education  2017 [employer].  Fall Prevention in the Home Falls can cause injuries. They can happen to people of all ages. There are many things you can do to make your home safe and to help prevent falls. What can I do on the outside of my home?  Regularly fix the edges of walkways and driveways and fix any cracks.  Remove anything that might  make you trip as you walk through a door, such as a raised step or threshold.  Trim any bushes or trees on the path to your home.  Use bright outdoor lighting.  Clear any walking paths of anything that might make someone trip, such as rocks or tools.  Regularly check to see if handrails are loose or broken. Make sure that both sides of any steps have handrails.  Any raised decks and porches should have guardrails on the edges.  Have any leaves, snow, or ice cleared regularly.  Use sand or salt on walking paths during winter.  Clean up any spills in your garage right away. This includes oil or grease spills. What can I do in the bathroom?  Use night lights.  Install grab bars by the toilet and in the tub and shower. Do not use towel bars as grab bars.  Use non-skid mats or decals in the tub or shower.  If you need to sit down in the shower, use a plastic, non-slip stool.  Keep the floor dry. Clean up any water that spills on the floor as soon as it happens.  Remove soap buildup in the tub or shower  regularly.  Attach bath mats securely with double-sided non-slip rug tape.  Do not have throw rugs and other things on the floor that can make you trip. What can I do in the bedroom?  Use night lights.  Make sure that you have a light by your bed that is easy to reach.  Do not use any sheets or blankets that are too big for your bed. They should not hang down onto the floor.  Have a firm chair that has side arms. You can use this for support while you get dressed.  Do not have throw rugs and other things on the floor that can make you trip. What can I do in the kitchen?  Clean up any spills right away.  Avoid walking on wet floors.  Keep items that you use a lot in easy-to-reach places.  If you need to reach something above you, use a strong step stool that has a grab bar.  Keep electrical cords out of the way.  Do not use floor polish or wax that makes floors  slippery. If you must use wax, use non-skid floor wax.  Do not have throw rugs and other things on the floor that can make you trip. What can I do with my stairs?  Do not leave any items on the stairs.  Make sure that there are handrails on both sides of the stairs and use them. Fix handrails that are broken or loose. Make sure that handrails are as long as the stairways.  Check any carpeting to make sure that it is firmly attached to the stairs. Fix any carpet that is loose or worn.  Avoid having throw rugs at the top or bottom of the stairs. If you do have throw rugs, attach them to the floor with carpet tape.  Make sure that you have a light switch at the top of the stairs and the bottom of the stairs. If you do not have them, ask someone to add them for you. What else can I do to help prevent falls?  Wear shoes that:  Do not have high heels.  Have rubber bottoms.  Are comfortable and fit you well.  Are closed at the toe. Do not wear sandals.  If you use a stepladder:  Make sure that it is fully opened. Do not climb a closed stepladder.  Make sure that both sides of the stepladder are locked into place.  Ask someone to hold it for you, if possible.  Clearly mark and make sure that you can see:  Any grab bars or handrails.  First and last steps.  Where the edge of each step is.  Use tools that help you move around (mobility aids) if they are needed. These include:  Canes.  Walkers.  Scooters.  Crutches.  Turn on the lights when you go into a dark area. Replace any light bulbs as soon as they burn out.  Set up your furniture so you have a clear path. Avoid moving your furniture around.  If any of your floors are uneven, fix them.  If there are any pets around you, be aware of where they are.  Review your medicines with your doctor. Some medicines can make you feel dizzy. This can increase your chance of falling. Ask your doctor what other things that you  can do to help prevent falls. This information is not intended to replace advice given to you by your health care provider. Make sure you discuss any questions  you have with your health care provider. Document Released: 09/16/2009 Document Revised: 04/27/2016 Document Reviewed: 12/25/2014 Elsevier Interactive Patient Education  2017 [employer].

## 2021-03-09 NOTE — Progress Notes (Signed)
Subjective:   Lisa Robles is a 75 y.o. female who presents for Medicare Annual (Subsequent) preventive examination.  Review of Systems    No ROS.  Medicare Wellness Virtual Visit.   Cardiac Risk Factors include: advanced age (>3men, >83 women);diabetes mellitus;hypertension     Objective:    Today's Vitals   03/09/21 1040  Weight: 180 lb (81.6 kg)  Height: 5\' 3"  (1.6 m)   Body mass index is 31.89 kg/m.  Advanced Directives 03/09/2021 03/08/2020 03/02/2020 03/05/2019 12/02/2018 01/21/2018 01/18/2017  Does Patient Have a Medical Advance Directive? Yes Yes Yes No No No No  Type of 01/20/2017 of [place];Living will Healthcare Power of [place];Living will Healthcare Power of [place];Living will - - - -  Does patient want to make changes to medical advance directive? No - Patient declined - No - Patient declined - - - -  Copy of Healthcare Power of Attorney in Chart? No - copy requested No - copy requested - - - - -  Would patient like information on creating a medical advance directive? - - - Yes (MAU/Ambulatory/Procedural Areas - Information given) - Yes (MAU/Ambulatory/Procedural Areas - Information given) No - Patient declined    Current Medications (verified) Outpatient Encounter Medications as of 03/09/2021  Medication Sig  . albuterol (VENTOLIN HFA) 108 (90 Base) MCG/ACT inhaler Inhale 2 puffs into the lungs every 6 (six) hours as needed.  05/09/2021 azelastine (ASTELIN) 0.1 % nasal spray 1 spray in the morning and at bedtime.  . benzonatate (TESSALON) 100 MG capsule Take 1 capsule (100 mg total) by mouth 3 (three) times daily as needed for cough. (Patient not taking: No sig reported)  . conjugated estrogens (PREMARIN) vaginal cream Place 1 Applicatorful vaginally daily. Use pea sized amount M-W-Fr before bedtime (Patient taking differently: Place 1 Applicatorful vaginally daily as needed. Use pea sized amount M-W-Fr before bedtime)  . docusate sodium (COLACE) 100  MG capsule Take 1 capsule (100 mg total) by mouth every 12 (twelve) hours.  . fluticasone (FLONASE) 50 MCG/ACT nasal spray Place 2 sprays into both nostrils daily.  Marland Kitchen guaiFENesin (MUCINEX) 600 MG 12 hr tablet Take 2 tablets (1,200 mg total) by mouth 2 (two) times daily as needed for cough or to loosen phlegm.  . hydrochlorothiazide (HYDRODIURIL) 25 MG tablet TAKE 1 TABLET(25 MG) BY MOUTH DAILY  . loratadine (CLARITIN) 10 MG tablet Take 10 mg by mouth daily. (Patient not taking: No sig reported)  . meclizine (ANTIVERT) 25 MG tablet Take 1 tablet (25 mg total) by mouth 3 (three) times daily as needed for dizziness.  . nystatin cream (MYCOSTATIN) Apply 1 application topically 2 (two) times daily.  . pantoprazole (PROTONIX) 40 MG tablet TAKE 1 TABLET(40 MG) BY MOUTH DAILY  . pravastatin (PRAVACHOL) 20 MG tablet TAKE 1 TABLET(20 MG) BY MOUTH DAILY  . WIXELA INHUB 100-50 MCG/DOSE AEPB INHALE 1 PUFF BY MOUTH TWICE DAILY (Patient not taking: Reported on 11/08/2020)   No facility-administered encounter medications on file as of 03/09/2021.    Allergies (verified) Cinnamon, Bactrim [sulfamethoxazole-trimethoprim], and Shellfish allergy   History: Past Medical History:  Diagnosis Date  . Diabetes mellitus without complication (HCC)   . GERD (gastroesophageal reflux disease)   . Hypercholesteremia   . Hypertension    Past Surgical History:  Procedure Laterality Date  . BREAST BIOPSY    . TOOTH EXTRACTION     tooth implantation  . TUBAL LIGATION  1979   Family History  Problem Relation Age of Onset  .  Heart attack Mother   . Heart attack Father   . Hypercholesterolemia Brother   . Diabetes Brother   . Cancer Maternal Aunt        breast  . Diabetes Maternal Grandmother   . Colon cancer Neg Hx   . Breast cancer Neg Hx    Social History   Socioeconomic History  . Marital status: Widowed    Spouse name: Not on file  . Number of children: 2  . Years of education: masters  . Highest  education level: Not on file  Occupational History  . Occupation: retired Runner, broadcasting/film/video  Tobacco Use  . Smoking status: Former Smoker    Packs/day: 1.00    Years: 7.00    Pack years: 7.00    Types: Cigarettes    Quit date: 12/04/1977    Years since quitting: 43.2  . Smokeless tobacco: Never Used  Vaping Use  . Vaping Use: Never used  Substance and Sexual Activity  . Alcohol use: No    Alcohol/week: 0.0 standard drinks  . Drug use: No  . Sexual activity: Yes    Birth control/protection: None    Comment: Is sexually active wih same partner x 14 years. Recently found out partner has been cheating.  Other Topics Concern  . Not on file  Social History Narrative  . Not on file   Social Determinants of Health   Financial Resource Strain: Low Risk   . Difficulty of Paying Living Expenses: Not hard at all  Food Insecurity: No Food Insecurity  . Worried About Programme researcher, broadcasting/film/video in the Last Year: Never true  . Ran Out of Food in the Last Year: Never true  Transportation Needs: No Transportation Needs  . Lack of Transportation (Medical): No  . Lack of Transportation (Non-Medical): No  Physical Activity: Insufficiently Active  . Days of Exercise per Week: 7 days  . Minutes of Exercise per Session: 20 min  Stress: No Stress Concern Present  . Feeling of Stress : Not at all  Social Connections: Unknown  . Frequency of Communication with Friends and Family: More than three times a week  . Frequency of Social Gatherings with Friends and Family: More than three times a week  . Attends Religious Services: Not on file  . Active Member of Clubs or Organizations: Yes  . Attends Banker Meetings: Not on file  . Marital Status: Widowed    Tobacco Counseling Counseling given: Not Answered   Clinical Intake:  Pre-visit preparation completed: Yes        Diabetes: Yes (Followed by pcp)  How often do you need to have someone help you when you read instructions, pamphlets, or  other written materials from your doctor or pharmacy?: 1 - Never   Nutrition Risk Assessment: Has the patient had any N/V/D within the last 2 weeks?  No  Does the patient have any non-healing wounds?  No  Has the patient had any unintentional weight loss or weight gain?  No   Diabetes: If diabetic, was a CBG obtained today?  No  Did the patient bring in their glucometer from home?  No  How often do you monitor your CBG's? Does not check at home.   Financial Strains and Diabetes Management: Are you having any financial strains with the device, your supplies or your medication? No .  Does the patient want to be seen by Chronic Care Management for management of their diabetes?  No  Would the patient like to  be referred to a Nutritionist or for Diabetic Management?  No    Interpreter Needed?: No      Activities of Daily Living In your present state of health, do you have any difficulty performing the following activities: 03/09/2021  Hearing? N  Vision? N  Difficulty concentrating or making decisions? N  Walking or climbing stairs? N  Dressing or bathing? N  Doing errands, shopping? N  Preparing Food and eating ? N  Using the Toilet? N  In the past six months, have you accidently leaked urine? N  Do you have problems with loss of bowel control? N  Managing your Medications? N  Managing your Finances? N  Housekeeping or managing your Housekeeping? Y  Comment Maid assist  Some recent data might be hidden    Patient Care Team: Dale [place], MD as PCP - General (Internal Medicine) Debbe Odea, MD as PCP - Cardiology (Cardiology)  Indicate any recent Medical Services you may have received from other than [facility] providers in the past year (date may be approximate).     Assessment:   This is a routine wellness examination for Tannah.  I connected with Keir today by telephone and verified that I am speaking with the correct person using two identifiers. Location  patient: home Location provider: work Persons participating in the virtual visit: patient, Engineer, civil (consulting).    I discussed the limitations, risks, security and privacy concerns of performing an evaluation and management service by telephone and the availability of in person appointments. The patient expressed understanding and verbally consented to this telephonic visit.    Interactive audio and video telecommunications were attempted between this provider and patient, however failed, due to patient having technical difficulties OR patient did not have access to video capability.  We continued and completed visit with audio only.  Some vital signs may be absent or patient reported.   Hearing/Vision screen  Hearing Screening   125Hz  250Hz  500Hz  1000Hz  2000Hz  3000Hz  4000Hz  6000Hz  8000Hz   Right ear:           Left ear:           Comments: Patient is able to hear conversational tones without difficulty.  No issues reported.  Vision Screening Comments: Wears corrective lenses  Visual acuity not assessed, virtual visit.  No retinopathy. They have seen their ophthalmologist in the last 12 months.   Dietary issues and exercise activities discussed: Current Exercise Habits: Home exercise routine, Type of exercise: walking, Time (Minutes): 20, Frequency (Times/Week): 7, Weekly Exercise (Minutes/Week): 140, Intensity: Mild  Heathy diet Good water intake  Goals      Patient Stated   .  I would like to lose weight (pt-stated)      Weight goal 155lb Stay active Stay hydrated Healthy diet      Depression Screen PHQ 2/9 Scores 03/09/2021 03/08/2020 03/02/2020 03/05/2019 04/12/2018 01/21/2018 07/06/2017  PHQ - 2 Score 0 0 0 0 0 0 0  PHQ- 9 Score - - - - - - 0    Fall Risk Fall Risk  03/09/2021 09/13/2020 03/24/2020 03/08/2020 03/02/2020  Falls in the past year? 1 0 0 0 0  Number falls in past yr: 0 - - - -  Comment - - - - -  Injury with Fall? 1 - - - -  Comment Notes she passed out while on vacation. Sought  medical care for cut on eye - - - -  Follow up Falls evaluation completed Falls evaluation completed - Falls evaluation completed;Falls  prevention discussed -    FALL RISK PREVENTION PERTAINING TO THE HOME: Handrails in use when climbing stairs? Yes Home free of loose throw rugs in walkways, pet beds, electrical cords, etc? Yes  Adequate lighting in your home to reduce risk of falls? Yes   ASSISTIVE DEVICES UTILIZED TO PREVENT FALLS: Life alert? No  Use of a cane, walker or w/c? No   TIMED UP AND GO: Was the test performed? No .  Virtual visit.   Cognitive Function: Patient is alert and oriented x3.  Denies difficulty focusing, making decisions, memory loss. Enjoys word search/crossword puzzles, socializes regularly and reading. MMSE/6CIT deferred. Normal by direct communication/observation.  MMSE - Mini Mental State Exam 01/18/2017 01/19/2016  Orientation to time 5 5  Orientation to Place 5 5  Registration 3 3  Attention/ Calculation 5 5  Recall 3 3  Language- name 2 objects 2 2  Language- repeat 1 1  Language- follow 3 step command 3 3  Language- read & follow direction 1 1  Write a sentence 1 1  Copy design 1 1  Total score 30 30     6CIT Screen 03/08/2020 03/05/2019 01/21/2018  What Year? 0 points 0 points 0 points  What month? 0 points 0 points 0 points  What time? 0 points 0 points 0 points  Count back from 20 0 points 0 points 0 points  Months in reverse 0 points 0 points 0 points  Repeat phrase 0 points 0 points 0 points  Total Score 0 0 0    Immunizations Immunization History  Administered Date(s) Administered  . Fluad Quad(high Dose 65+) 08/28/2019, 09/22/2020  . Influenza, High Dose Seasonal PF 08/17/2016, 08/22/2017, 08/28/2018  . Influenza,inj,Quad PF,6+ Mos 07/29/2014  . Influenza-Unspecified 09/01/2013, 09/09/2015  . PFIZER(Purple Top)SARS-COV-2 Vaccination 12/10/2019, 12/31/2019, 09/03/2020  . Pneumococcal Conjugate-13 12/01/2015  . Pneumococcal  Polysaccharide-23 07/18/2011, 06/28/2017  . Tdap 05/19/2011  . Zoster Recombinat (Shingrix) 06/28/2017   Health Maintenance Health Maintenance  Topic Date Due  . FOOT EXAM  05/30/2021 (Originally 05/27/1956)  . TETANUS/TDAP  05/18/2021  . INFLUENZA VACCINE  07/04/2021  . HEMOGLOBIN A1C  07/13/2021  . URINE MICROALBUMIN  09/09/2021  . OPHTHALMOLOGY EXAM  02/15/2022  . DEXA SCAN  Completed  . COVID-19 Vaccine  Completed  . Hepatitis C Screening  Completed  . PNA vac Low Risk Adult  Completed  . HPV VACCINES  Aged Out   Cologuard requested. Ordered.   Mammogram status: Completed 10/2020. Repeat every year  Lung Cancer Screening: (Low Dose CT Chest recommended if Age 16-80 years, 30 pack-year currently smoking OR have quit w/in 15years.) does not qualify.   Vision Screening: Recommended annual ophthalmology exams for early detection of glaucoma and other disorders of the eye. Is the patient up to date with their annual eye exam?  Yes   Dental Screening: Recommended annual dental exams for proper oral hygiene.  Community Resource Referral / Chronic Care Management: CRR required this visit?  No   CCM required this visit?  No      Plan:   Keep all routine maintenance appointments.   Next scheduled lab 05/26/21   Cpe 05/30/21   I have personally reviewed and noted the following in the patient's chart:   . Medical and social history . Use of alcohol, tobacco or illicit drugs  . Current medications and supplements . Functional ability and status . Nutritional status . Physical activity . Advanced directives . List of other physicians . Hospitalizations, surgeries, and  ER visits in previous 12 months . Vitals . Screenings to include cognitive, depression, and falls . Referrals and appointments  In addition, I have reviewed and discussed with patient certain preventive protocols, quality metrics, and best practice recommendations. A written personalized care plan for  preventive services as well as general preventive health recommendations were provided to patient via mychart.     Ashok PallOBrien-Blaney, Lige Lakeman L, LPN   4/0/98114/05/2021

## 2021-03-12 DIAGNOSIS — G4733 Obstructive sleep apnea (adult) (pediatric): Secondary | ICD-10-CM | POA: Diagnosis not present

## 2021-04-06 ENCOUNTER — Telehealth: Payer: Self-pay

## 2021-04-06 NOTE — Telephone Encounter (Signed)
Noted in chart.

## 2021-04-06 NOTE — Telephone Encounter (Signed)
Pt states that she is changing pharmacies to [facility] mail order and wanted that noted in her chart. She states that she is fine on medication right now and I advised her to call [facility] when she has about 10-14 days left of her medication to ensure delivery before she runs out.

## 2021-04-11 DIAGNOSIS — G4733 Obstructive sleep apnea (adult) (pediatric): Secondary | ICD-10-CM | POA: Diagnosis not present

## 2021-04-19 DIAGNOSIS — G4733 Obstructive sleep apnea (adult) (pediatric): Secondary | ICD-10-CM | POA: Diagnosis not present

## 2021-05-12 DIAGNOSIS — G4733 Obstructive sleep apnea (adult) (pediatric): Secondary | ICD-10-CM | POA: Diagnosis not present

## 2021-05-20 DIAGNOSIS — G4733 Obstructive sleep apnea (adult) (pediatric): Secondary | ICD-10-CM | POA: Diagnosis not present

## 2021-05-26 ENCOUNTER — Other Ambulatory Visit: Payer: Self-pay

## 2021-05-26 ENCOUNTER — Other Ambulatory Visit (INDEPENDENT_AMBULATORY_CARE_PROVIDER_SITE_OTHER): Payer: Medicare PPO

## 2021-05-26 DIAGNOSIS — E7800 Pure hypercholesterolemia, unspecified: Secondary | ICD-10-CM | POA: Diagnosis not present

## 2021-05-26 DIAGNOSIS — E119 Type 2 diabetes mellitus without complications: Secondary | ICD-10-CM

## 2021-05-26 LAB — HEPATIC FUNCTION PANEL
ALT: 12 U/L (ref 0–35)
AST: 16 U/L (ref 0–37)
Albumin: 4 g/dL (ref 3.5–5.2)
Alkaline Phosphatase: 70 U/L (ref 39–117)
Bilirubin, Direct: 0.1 mg/dL (ref 0.0–0.3)
Total Bilirubin: 0.6 mg/dL (ref 0.2–1.2)
Total Protein: 7.1 g/dL (ref 6.0–8.3)

## 2021-05-26 LAB — BASIC METABOLIC PANEL
BUN: 12 mg/dL (ref 6–23)
CO2: 29 mEq/L (ref 19–32)
Calcium: 10 mg/dL (ref 8.4–10.5)
Chloride: 103 mEq/L (ref 96–112)
Creatinine, Ser: 0.75 mg/dL (ref 0.40–1.20)
GFR: 77.02 mL/min (ref >60.00)
Glucose, Bld: 110 mg/dL — ABNORMAL HIGH (ref 70–99)
Potassium: 4 mEq/L (ref 3.5–5.1)
Sodium: 138 mEq/L (ref 135–145)

## 2021-05-26 LAB — LIPID PANEL
Cholesterol: 216 mg/dL — ABNORMAL HIGH (ref 0–200)
HDL: 53.2 mg/dL (ref >39.00)
LDL Cholesterol: 134 mg/dL — ABNORMAL HIGH (ref 0–99)
NonHDL: 162.32
Total CHOL/HDL Ratio: 4
Triglycerides: 140 mg/dL (ref 0.0–149.0)
VLDL: 28 mg/dL (ref 0.0–40.0)

## 2021-05-26 LAB — HEMOGLOBIN A1C: Hgb A1c MFr Bld: 6.3 % (ref 4.6–6.5)

## 2021-05-30 ENCOUNTER — Other Ambulatory Visit: Payer: Self-pay

## 2021-05-30 ENCOUNTER — Ambulatory Visit (INDEPENDENT_AMBULATORY_CARE_PROVIDER_SITE_OTHER): Payer: Medicare PPO | Admitting: Internal Medicine

## 2021-05-30 VITALS — BP 130/80 | HR 72 | Temp 97.9°F | Resp 16 | Ht 63.0 in | Wt 185.8 lb

## 2021-05-30 DIAGNOSIS — E119 Type 2 diabetes mellitus without complications: Secondary | ICD-10-CM

## 2021-05-30 DIAGNOSIS — E7800 Pure hypercholesterolemia, unspecified: Secondary | ICD-10-CM | POA: Diagnosis not present

## 2021-05-30 DIAGNOSIS — E538 Deficiency of other specified B group vitamins: Secondary | ICD-10-CM

## 2021-05-30 DIAGNOSIS — I10 Essential (primary) hypertension: Secondary | ICD-10-CM

## 2021-05-30 DIAGNOSIS — R55 Syncope and collapse: Secondary | ICD-10-CM | POA: Diagnosis not present

## 2021-05-30 DIAGNOSIS — K219 Gastro-esophageal reflux disease without esophagitis: Secondary | ICD-10-CM | POA: Diagnosis not present

## 2021-05-30 DIAGNOSIS — G4733 Obstructive sleep apnea (adult) (pediatric): Secondary | ICD-10-CM

## 2021-05-30 DIAGNOSIS — Z0000 Encounter for general adult medical examination without abnormal findings: Secondary | ICD-10-CM

## 2021-05-30 DIAGNOSIS — J4520 Mild intermittent asthma, uncomplicated: Secondary | ICD-10-CM | POA: Diagnosis not present

## 2021-05-30 MED ORDER — PRAVASTATIN SODIUM 40 MG PO TABS: 40.0000 mg | ORAL_TABLET | Freq: Every day | ORAL | 1 refills | Status: DC

## 2021-05-30 NOTE — Progress Notes (Signed)
Patient ID: Lisa Robles, female   DOB: 1946/10/26, 75 y.o.   MRN: [id number]   Subjective:    Patient ID: Lisa Robles, female    DOB: 30-May-1946, 75 y.o.   MRN: [id number]  HPI This visit occurred during the SARS-CoV-2 public health emergency.  Safety protocols were in place, including screening questions prior to the visit, additional usage of staff PPE, and extensive cleaning of exam room while observing appropriate contact time as indicated for disinfecting solutions.   Patient here for her physical exam.  She is doing relatively well.  Did have a syncopal episode in [place].  She did not drink when flying out.  Had a cup of coffee and a whiskey sour that day.  No other fluids.  Went into the store, was standing and felt funny.  Leaned up against the wall and the next thing she remembers, she was waking up on the floor.  No confusion.  Had a cut over her left eye - bleeding.  Went to ER.  No other injuries.  Per report, CT head negative.  Was told she was dehydrated.  Was given fluids.  Has not had any other episodes since.  No further syncope or near syncope.  No headache, light headedness or dizziness.  No chest pain or sob.     Past Medical History:  Diagnosis Date   Diabetes mellitus without complication (HCC)    GERD (gastroesophageal reflux disease)    Hypercholesteremia    Hypertension    Past Surgical History:  Procedure Laterality Date   BREAST BIOPSY     TOOTH EXTRACTION     tooth implantation   TUBAL LIGATION  1979   Family History  Problem Relation Age of Onset   Heart attack Mother    Heart attack Father    Hypercholesterolemia Brother    Diabetes Brother    Cancer Maternal Aunt        breast   Diabetes Maternal Grandmother    Colon cancer Neg Hx    Breast cancer Neg Hx    Social History   Socioeconomic History   Marital status: Widowed    Spouse name: Not on file   Number of children: 2   Years of education: masters   Highest education level: Not on  file  Occupational History   Occupation: retired Pharmacist, hospital  Tobacco Use   Smoking status: Former    Packs/day: 1.00    Years: 7.00    Pack years: 7.00    Types: Cigarettes    Quit date: 12/04/1977    Years since quitting: 43.5   Smokeless tobacco: Never  Vaping Use   Vaping Use: Never used  Substance and Sexual Activity   Alcohol use: No    Alcohol/week: 0.0 standard drinks   Drug use: No   Sexual activity: Yes    Birth control/protection: None    Comment: Is sexually active wih same partner x 14 years. Recently found out partner has been cheating.  Other Topics Concern   Not on file  Social History Narrative   Not on file   Social Determinants of Health   Financial Resource Strain: Low Risk    Difficulty of Paying Living Expenses: Not hard at all  Food Insecurity: No Food Insecurity   Worried About Charity fundraiser in the Last Year: Never true   [place] in the Last Year: Never true  Transportation Needs: No Transportation Needs   Lack of Transportation (Medical): No  Lack of Transportation (Non-Medical): No  Physical Activity: Insufficiently Active   Days of Exercise per Week: 7 days   Minutes of Exercise per Session: 20 min  Stress: No Stress Concern Present   Feeling of Stress : Not at all  Social Connections: Unknown   Frequency of Communication with Friends and Family: More than three times a week   Frequency of Social Gatherings with Friends and Family: More than three times a week   Attends Religious Services: Not on file   Active Member of Clubs or Organizations: Yes   Attends Archivist Meetings: Not on file   Marital Status: Widowed    Review of Systems  Constitutional:  Negative for appetite change and unexpected weight change.  HENT:  Negative for congestion, sinus pressure and sore throat.   Eyes:  Negative for pain and visual disturbance.  Respiratory:  Negative for cough, chest tightness and shortness of breath.   Cardiovascular:   Negative for chest pain, palpitations and leg swelling.  Gastrointestinal:  Negative for abdominal pain, diarrhea, nausea and vomiting.  Genitourinary:  Negative for difficulty urinating and dysuria.  Musculoskeletal:  Negative for joint swelling and myalgias.  Skin:  Negative for color change and rash.  Neurological:  Negative for dizziness, light-headedness and headaches.  Hematological:  Negative for adenopathy. Does not bruise/bleed easily.  Psychiatric/Behavioral:  Negative for agitation and dysphoric mood.       Objective:    Physical Exam Vitals reviewed.  Constitutional:      General: She is not in acute distress.    Appearance: Normal appearance. She is well-developed.  HENT:     Head: Normocephalic and atraumatic.     Right Ear: External ear normal.     Left Ear: External ear normal.  Eyes:     General: No scleral icterus.       Right eye: No discharge.        Left eye: No discharge.     Conjunctiva/sclera: Conjunctivae normal.  Neck:     Thyroid: No thyromegaly.  Cardiovascular:     Rate and Rhythm: Normal rate and regular rhythm.  Pulmonary:     Effort: No tachypnea, accessory muscle usage or respiratory distress.     Breath sounds: Normal breath sounds. No decreased breath sounds or wheezing.  Chest:  Breasts:    Right: No inverted nipple, mass, nipple discharge or tenderness (no axillary adenopathy).     Left: No inverted nipple, mass, nipple discharge or tenderness (no axilarry adenopathy).  Abdominal:     General: Bowel sounds are normal.     Palpations: Abdomen is soft.     Tenderness: There is no abdominal tenderness.  Musculoskeletal:        General: No swelling or tenderness.     Cervical back: Neck supple.  Lymphadenopathy:     Cervical: No cervical adenopathy.  Skin:    Findings: No erythema or rash.  Neurological:     Mental Status: She is alert and oriented to person, place, and time.  Psychiatric:        Mood and Affect: Mood normal.         Behavior: Behavior normal.    BP 130/80   Pulse 72   Temp 97.9 F (36.6 C)   Resp 16   Ht _0  (1.6 m)   Wt 185 lb 12.8 oz (84.3 kg)   SpO2 98%   BMI 32.91 kg/m  Wt Readings from Last 3 Encounters:  05/30/21 185 lb  12.8 oz (84.3 kg)  03/09/21 180 lb (81.6 kg)  01/17/21 180 lb (81.6 kg)    Outpatient Encounter Medications as of 05/30/2021  Medication Sig   pravastatin (PRAVACHOL) 40 MG tablet Take 1 tablet (40 mg total) by mouth daily.   albuterol (VENTOLIN HFA) 108 (90 Base) MCG/ACT inhaler Inhale 2 puffs into the lungs every 6 (six) hours as needed.   azelastine (ASTELIN) 0.1 % nasal spray 1 spray in the morning and at bedtime.   conjugated estrogens (PREMARIN) vaginal cream Place 1 Applicatorful vaginally daily. Use pea sized amount M-W-Fr before bedtime (Patient taking differently: Place 1 Applicatorful vaginally daily as needed. Use pea sized amount M-W-Fr before bedtime)   docusate sodium (COLACE) 100 MG capsule Take 1 capsule (100 mg total) by mouth every 12 (twelve) hours.   fluticasone (FLONASE) 50 MCG/ACT nasal spray Place 2 sprays into both nostrils daily.   guaiFENesin (MUCINEX) 600 MG 12 hr tablet Take 2 tablets (1,200 mg total) by mouth 2 (two) times daily as needed for cough or to loosen phlegm.   hydrochlorothiazide (HYDRODIURIL) 25 MG tablet TAKE 1 TABLET(25 MG) BY MOUTH DAILY   meclizine (ANTIVERT) 25 MG tablet Take 1 tablet (25 mg total) by mouth 3 (three) times daily as needed for dizziness.   nystatin cream (MYCOSTATIN) Apply 1 application topically 2 (two) times daily.   pantoprazole (PROTONIX) 40 MG tablet TAKE 1 TABLET(40 MG) BY MOUTH DAILY   [DISCONTINUED] benzonatate (TESSALON) 100 MG capsule Take 1 capsule (100 mg total) by mouth 3 (three) times daily as needed for cough. (Patient not taking: No sig reported)   [DISCONTINUED] loratadine (CLARITIN) 10 MG tablet Take 10 mg by mouth daily. (Patient not taking: No sig reported)   [DISCONTINUED] pravastatin  (PRAVACHOL) 20 MG tablet TAKE 1 TABLET(20 MG) BY MOUTH DAILY   [DISCONTINUED] WIXELA INHUB 100-50 MCG/DOSE AEPB INHALE 1 PUFF BY MOUTH TWICE DAILY (Patient not taking: Reported on 11/08/2020)   No facility-administered encounter medications on file as of 05/30/2021.     Lab Results  Component Value Date   WBC 8.1 01/13/2021   HGB 13.1 01/13/2021   HCT 40.0 01/13/2021   PLT 300.0 01/13/2021   GLUCOSE 110 (H) 05/26/2021   CHOL 216 (H) 05/26/2021   TRIG 140.0 05/26/2021   HDL 53.20 05/26/2021   LDLDIRECT 142.7 12/11/2013   LDLCALC 134 (H) 05/26/2021   ALT 12 05/26/2021   AST 16 05/26/2021   NA 138 05/26/2021   K 4.0 05/26/2021   CL 103 05/26/2021   CREATININE 0.75 05/26/2021   BUN 12 05/26/2021   CO2 29 05/26/2021   TSH 2.04 01/13/2021   HGBA1C 6.3 05/26/2021   MICROALBUR 1.1 09/09/2020    MM 3D SCREEN BREAST BILATERAL  Result Date: 10/14/2020 CLINICAL DATA:  Screening. EXAM: DIGITAL SCREENING BILATERAL MAMMOGRAM WITH TOMO AND CAD COMPARISON:  Previous exam(s). ACR Breast Density Category c: The breast tissue is heterogeneously dense, which may obscure small masses. FINDINGS: There are no findings suspicious for malignancy. Images were processed with CAD. IMPRESSION: No mammographic evidence of malignancy. A result letter of this screening mammogram will be mailed directly to the patient. RECOMMENDATION: Screening mammogram in one year. (Code:SM-B-01Y) BI-RADS CATEGORY  1: Negative. Electronically Signed   By: Dorise Bullion III M.D   On: 10/14/2020 11:11       Assessment & Plan:   Problem List Items Addressed This Visit     Asthma    Breathing stable.  Has rescue inhaler if needed.  B12 deficiency    Continue b12 injections.        Diabetes mellitus without complication ([place])    T2Y just checked - 6.4.  Low carb diet and exercise.  Follow met b and a1c.        Relevant Medications   pravastatin (PRAVACHOL) 40 MG tablet   Other Relevant Orders    Hemoglobin A1c   Hemoglobin E1Y   Basic metabolic panel   GERD (gastroesophageal reflux disease)    No acid reflux symptoms.  On protonix.        Health care maintenance    Physical today 05/30/21.  Mammogram 10/14/20 - Birads I.  Colonoscopy 09/2011 - recommended f/u in 10 years.  cologuard ordered 03/2021.         Healthcare maintenance    Physical today 05/30/21.  Mammogram 10/14/20 - Birads I.  cologuard - given 03/2021.         Hypercholesterolemia    On pravastatin.  Low cholesterol diet and exercise.  Follow lipid panel and liver function tests.         Relevant Medications   pravastatin (PRAVACHOL) 40 MG tablet   Other Relevant Orders   Lipid panel   Hepatic function panel   CBC with Differential/Platelet   Hepatic function panel   Lipid panel   Hypertension    Continue hctz.  Follow pressures.  Follow metabolic panel.        Relevant Medications   pravastatin (PRAVACHOL) 40 MG tablet   Other Relevant Orders   Basic metabolic panel   TSH   OSA (obstructive sleep apnea)    Continue cpap.        Syncope    Syncope as outlined.  Evaluated in ER as outlined.  Felt related to being dehydrated.  Given IVFs.  She has been trying to stay hydrated.  No further episodes.  Follow.        Other Visit Diagnoses     Routine general medical examination at a health care facility    -  Primary        Einar Pheasant, MD

## 2021-05-30 NOTE — Assessment & Plan Note (Addendum)
Physical today 05/30/21.  Mammogram 10/14/20 - Birads I.  Colonoscopy 09/2011 - recommended f/u in 10 years.  cologuard ordered 03/2021.

## 2021-06-05 ENCOUNTER — Other Ambulatory Visit: Payer: Self-pay | Admitting: Internal Medicine

## 2021-06-06 ENCOUNTER — Encounter: Payer: Self-pay | Admitting: Internal Medicine

## 2021-06-06 DIAGNOSIS — R55 Syncope and collapse: Secondary | ICD-10-CM | POA: Insufficient documentation

## 2021-06-06 NOTE — Assessment & Plan Note (Signed)
No acid reflux symptoms.  On protonix.

## 2021-06-06 NOTE — Assessment & Plan Note (Signed)
Continue cpap.  

## 2021-06-06 NOTE — Assessment & Plan Note (Signed)
a1c just checked - 6.4.  Low carb diet and exercise.  Follow met b and a1c.

## 2021-06-06 NOTE — Assessment & Plan Note (Signed)
Syncope as outlined.  Evaluated in ER as outlined.  Felt related to being dehydrated.  Given IVFs.  She has been trying to stay hydrated.  No further episodes.  Follow.

## 2021-06-06 NOTE — Assessment & Plan Note (Signed)
Breathing stable.  Has rescue inhaler if needed.   

## 2021-06-06 NOTE — Assessment & Plan Note (Signed)
On pravastatin.  Low cholesterol diet and exercise.  Follow lipid panel and liver function tests.   

## 2021-06-06 NOTE — Assessment & Plan Note (Signed)
Physical today 05/30/21.  Mammogram 10/14/20 - Birads I.  cologuard - given 03/2021.

## 2021-06-06 NOTE — Assessment & Plan Note (Signed)
Continue b12 injections.  

## 2021-06-06 NOTE — Assessment & Plan Note (Signed)
Continue hctz.  Follow pressures.  Follow metabolic panel.  

## 2021-06-11 DIAGNOSIS — G4733 Obstructive sleep apnea (adult) (pediatric): Secondary | ICD-10-CM | POA: Diagnosis not present

## 2021-06-19 DIAGNOSIS — G4733 Obstructive sleep apnea (adult) (pediatric): Secondary | ICD-10-CM | POA: Diagnosis not present

## 2021-06-21 ENCOUNTER — Ambulatory Visit: Payer: Medicare PPO | Admitting: Family Medicine

## 2021-06-21 ENCOUNTER — Other Ambulatory Visit: Payer: Self-pay

## 2021-06-21 ENCOUNTER — Encounter: Payer: Self-pay | Admitting: Family Medicine

## 2021-06-21 VITALS — BP 126/78 | HR 71 | Temp 98.4°F | Ht 63.0 in | Wt 186.1 lb

## 2021-06-21 DIAGNOSIS — M545 Low back pain, unspecified: Secondary | ICD-10-CM | POA: Diagnosis not present

## 2021-06-21 DIAGNOSIS — N3000 Acute cystitis without hematuria: Secondary | ICD-10-CM | POA: Diagnosis not present

## 2021-06-21 LAB — POCT URINALYSIS DIPSTICK
Bilirubin, UA: NEGATIVE
Blood, UA: NEGATIVE
Glucose, UA: NEGATIVE
Ketones, UA: NEGATIVE
Nitrite, UA: POSITIVE
Protein, UA: NEGATIVE
Spec Grav, UA: 1.015 (ref 1.010–1.025)
Urobilinogen, UA: 2 E.U./dL — AB
pH, UA: 6 (ref 5.0–8.0)

## 2021-06-21 MED ORDER — NITROFURANTOIN MONOHYD MACRO 100 MG PO CAPS: 100.0000 mg | ORAL_CAPSULE | Freq: Two times a day (BID) | ORAL | 0 refills | Status: DC

## 2021-06-21 NOTE — Assessment & Plan Note (Addendum)
UA after Azo with large LE and + nitrites however microscopy without significant WBC. Given symptoms, suspect acute cystitis. UCx sent. In interim, start macrobid 5d course. Further supportive measures also reviewed. Pt agrees with plan.  Pt endorsed R flank pain however discomfort is at right lower back. We initially discussed 7d antibiotic course however she should do well with 5 days.

## 2021-06-21 NOTE — Progress Notes (Signed)
Patient ID: Lisa Robles, female    DOB: 1945/12/08, 75 y.o.   MRN: [id number]  This visit was conducted in person.  BP 126/78   Pulse 71   Temp 98.4 F (36.9 C) (Temporal)   Ht 5\' 3"  (1.6 m)   Wt 186 lb 1.6 oz (84.4 kg)   SpO2 96%   BMI 32.97 kg/m    CC: R flank pain Subjective:   HPI: Lisa Robles is a 75 y.o. female presenting on 06/21/2021 for Acute Visit (C/o having RT flank pain x 3-4 days.  Has taken AZO with some relief. )   6d h/o R flank pain associated with urgency and frequency. Started after she used her CPAP machine without water - too dry.   No fevers/chills, dysuria, hematuria, abd pain, nausea or vomiting.  No recent abx.  Last UTI was several years ago.   Treated with azo and probiotic with benefit.   H/o diet controlled diabetes  Lab Results  Component Value Date   HGBA1C 6.3 05/26/2021        Relevant past medical, surgical, family and social history reviewed and updated as indicated. Interim medical history since our last visit reviewed. Allergies and medications reviewed and updated. Outpatient Medications Prior to Visit  Medication Sig Dispense Refill   albuterol (VENTOLIN HFA) 108 (90 Base) MCG/ACT inhaler Inhale 2 puffs into the lungs every 6 (six) hours as needed. 18 g 2   azelastine (ASTELIN) 0.1 % nasal spray 1 spray in the morning and at bedtime.     conjugated estrogens (PREMARIN) vaginal cream Place 1 Applicatorful vaginally daily. Use pea sized amount M-W-Fr before bedtime (Patient taking differently: Place 1 Applicatorful vaginally daily as needed. Use pea sized amount M-W-Fr before bedtime) 42.5 g 12   docusate sodium (COLACE) 100 MG capsule Take 1 capsule (100 mg total) by mouth every 12 (twelve) hours. 60 capsule 0   fluticasone (FLONASE) 50 MCG/ACT nasal spray Place 2 sprays into both nostrils daily. 16 g 1   guaiFENesin (MUCINEX) 600 MG 12 hr tablet Take 2 tablets (1,200 mg total) by mouth 2 (two) times daily as needed for  cough or to loosen phlegm. 20 tablet 0   hydrochlorothiazide (HYDRODIURIL) 25 MG tablet TAKE 1 TABLET(25 MG) BY MOUTH DAILY 90 tablet 1   meclizine (ANTIVERT) 25 MG tablet Take 1 tablet (25 mg total) by mouth 3 (three) times daily as needed for dizziness. 30 tablet 0   nystatin cream (MYCOSTATIN) Apply 1 application topically 2 (two) times daily. 60 g 0   pantoprazole (PROTONIX) 40 MG tablet TAKE 1 TABLET(40 MG) BY MOUTH DAILY 30 tablet 2   pravastatin (PRAVACHOL) 40 MG tablet Take 1 tablet (40 mg total) by mouth daily. 90 tablet 1   No facility-administered medications prior to visit.     Per HPI unless specifically indicated in ROS section below Review of Systems  Objective:  BP 126/78   Pulse 71   Temp 98.4 F (36.9 C) (Temporal)   Ht 5\' 3"  (1.6 m)   Wt 186 lb 1.6 oz (84.4 kg)   SpO2 96%   BMI 32.97 kg/m   Wt Readings from Last 3 Encounters:  06/21/21 186 lb 1.6 oz (84.4 kg)  05/30/21 185 lb 12.8 oz (84.3 kg)  03/09/21 180 lb (81.6 kg)      Physical Exam Vitals and nursing note reviewed.  Constitutional:      Appearance: Normal appearance. She is not ill-appearing.  Cardiovascular:  Rate and Rhythm: Normal rate and regular rhythm.     Pulses: Normal pulses.     Heart sounds: Normal heart sounds. No murmur heard. Pulmonary:     Effort: Pulmonary effort is normal. No respiratory distress.     Breath sounds: Normal breath sounds. No wheezing, rhonchi or rales.  Musculoskeletal:     Right lower leg: No edema.     Left lower leg: No edema.     Comments:  No pain midline spine Discomfort to palpation L>R lumbar paraspinous mm tenderness Neg seated SLR bilaterally. No pain at GTB or sciatic notch bilaterally. Discomfort to L>R SIJ   Skin:    General: Skin is warm and dry.     Findings: No rash.  Neurological:     Mental Status: She is alert.  Psychiatric:        Mood and Affect: Mood normal.        Behavior: Behavior normal.      Results for orders placed or  performed in visit on 06/21/21  POCT Urinalysis Dipstick  Result Value Ref Range   Color, UA dark yellow    Clarity, UA clear    Glucose, UA Negative Negative   Bilirubin, UA neg    Ketones, UA neg    Spec Grav, UA 1.015 1.010 - 1.025   Blood, UA neg    pH, UA 6.0 5.0 - 8.0   Protein, UA Negative Negative   Urobilinogen, UA 2.0 (A) 0.2 or 1.0 E.U./dL   Nitrite, UA positive    Leukocytes, UA Large (3+) (A) Negative   Appearance     Odor      Assessment & Plan:  This visit occurred during the SARS-CoV-2 public health emergency.  Safety protocols were in place, including screening questions prior to the visit, additional usage of staff PPE, and extensive cleaning of exam room while observing appropriate contact time as indicated for disinfecting solutions.   Problem List Items Addressed This Visit     UTI (urinary tract infection) - Primary    UA after Azo with large LE and + nitrites however microscopy without significant WBC. Given symptoms, suspect acute cystitis. UCx sent. In interim, start macrobid 5d course. Further supportive measures also reviewed. Pt agrees with plan.  Pt endorsed R flank pain however discomfort is at right lower back. We initially discussed 7d antibiotic course however she should do well with 5 days.        Relevant Medications   nitrofurantoin, macrocrystal-monohydrate, (MACROBID) 100 MG capsule   Other Relevant Orders   Urine Culture   Other Visit Diagnoses     Acute right-sided low back pain without sciatica       Relevant Orders   POCT Urinalysis Dipstick (Completed)        Meds ordered this encounter  Medications   nitrofurantoin, macrocrystal-monohydrate, (MACROBID) 100 MG capsule    Sig: Take 1 capsule (100 mg total) by mouth 2 (two) times daily.    Dispense:  10 capsule    Refill:  0    Orders Placed This Encounter  Procedures   Urine Culture   POCT Urinalysis Dipstick     Patient Instructions  Possible urine infection. Urine  culture sent. Start macrobid twice daily for 5 days.  Push fluids and rest. May use tylenol for back discomfort. Let us know if not improving with treatment, or if new symptoms like fevers or nausea develop.   Follow up plan: Return if symptoms worsen or fail to  improve.  Ria Bush, MD

## 2021-06-21 NOTE — Patient Instructions (Addendum)
Possible urine infection. Urine culture sent. Start macrobid twice daily for 5 days.  Push fluids and rest. May use tylenol for back discomfort. Let us know if not improving with treatment, or if new symptoms like fevers or nausea develop.

## 2021-06-22 LAB — URINE CULTURE
MICRO NUMBER:: 12136421
Result:: NO GROWTH
SPECIMEN QUALITY:: ADEQUATE

## 2021-06-29 ENCOUNTER — Other Ambulatory Visit: Payer: Self-pay

## 2021-06-29 ENCOUNTER — Ambulatory Visit (INDEPENDENT_AMBULATORY_CARE_PROVIDER_SITE_OTHER): Payer: Medicare PPO | Admitting: *Deleted

## 2021-06-29 DIAGNOSIS — E538 Deficiency of other specified B group vitamins: Secondary | ICD-10-CM | POA: Diagnosis not present

## 2021-06-29 MED ORDER — CYANOCOBALAMIN 1000 MCG/ML IJ SOLN
1000.0000 ug | Freq: Once | INTRAMUSCULAR | Status: AC
  Administered 2021-06-29: 1000 ug via INTRAMUSCULAR

## 2021-06-29 NOTE — Progress Notes (Signed)
Patient presented for B 12 injection to left deltoid, patient voiced no concerns nor showed any signs of distress during injection . See new order from 06/06/21.

## 2021-07-12 DIAGNOSIS — G4733 Obstructive sleep apnea (adult) (pediatric): Secondary | ICD-10-CM | POA: Diagnosis not present

## 2021-07-19 DIAGNOSIS — G4733 Obstructive sleep apnea (adult) (pediatric): Secondary | ICD-10-CM | POA: Diagnosis not present

## 2021-08-03 DIAGNOSIS — Z8249 Family history of ischemic heart disease and other diseases of the circulatory system: Secondary | ICD-10-CM | POA: Diagnosis not present

## 2021-08-03 DIAGNOSIS — G4733 Obstructive sleep apnea (adult) (pediatric): Secondary | ICD-10-CM | POA: Diagnosis not present

## 2021-08-03 DIAGNOSIS — E669 Obesity, unspecified: Secondary | ICD-10-CM | POA: Diagnosis not present

## 2021-08-03 DIAGNOSIS — I10 Essential (primary) hypertension: Secondary | ICD-10-CM | POA: Diagnosis not present

## 2021-08-03 DIAGNOSIS — J45909 Unspecified asthma, uncomplicated: Secondary | ICD-10-CM | POA: Diagnosis not present

## 2021-08-03 DIAGNOSIS — K219 Gastro-esophageal reflux disease without esophagitis: Secondary | ICD-10-CM | POA: Diagnosis not present

## 2021-08-03 DIAGNOSIS — Z6833 Body mass index (BMI) 33.0-33.9, adult: Secondary | ICD-10-CM | POA: Diagnosis not present

## 2021-08-03 DIAGNOSIS — E785 Hyperlipidemia, unspecified: Secondary | ICD-10-CM | POA: Diagnosis not present

## 2021-08-03 DIAGNOSIS — Z7722 Contact with and (suspected) exposure to environmental tobacco smoke (acute) (chronic): Secondary | ICD-10-CM | POA: Diagnosis not present

## 2021-08-09 ENCOUNTER — Other Ambulatory Visit: Payer: Self-pay

## 2021-08-09 ENCOUNTER — Ambulatory Visit (INDEPENDENT_AMBULATORY_CARE_PROVIDER_SITE_OTHER): Payer: Medicare PPO

## 2021-08-09 DIAGNOSIS — E538 Deficiency of other specified B group vitamins: Secondary | ICD-10-CM | POA: Diagnosis not present

## 2021-08-09 MED ORDER — CYANOCOBALAMIN 1000 MCG/ML IJ SOLN
1000.0000 ug | Freq: Once | INTRAMUSCULAR | Status: AC
  Administered 2021-08-09: 1000 ug via INTRAMUSCULAR

## 2021-08-09 NOTE — Progress Notes (Signed)
Patient presented for B 12 injection to left deltoid, patient voiced no concerns nor showed any signs of distress during injection. 

## 2021-08-12 DIAGNOSIS — G4733 Obstructive sleep apnea (adult) (pediatric): Secondary | ICD-10-CM | POA: Diagnosis not present

## 2021-08-19 DIAGNOSIS — G4733 Obstructive sleep apnea (adult) (pediatric): Secondary | ICD-10-CM | POA: Diagnosis not present

## 2021-09-09 ENCOUNTER — Ambulatory Visit: Payer: Medicare PPO

## 2021-09-11 DIAGNOSIS — G4733 Obstructive sleep apnea (adult) (pediatric): Secondary | ICD-10-CM | POA: Diagnosis not present

## 2021-09-12 ENCOUNTER — Other Ambulatory Visit: Payer: Self-pay

## 2021-09-12 ENCOUNTER — Ambulatory Visit (INDEPENDENT_AMBULATORY_CARE_PROVIDER_SITE_OTHER): Payer: Medicare PPO

## 2021-09-12 DIAGNOSIS — Z23 Encounter for immunization: Secondary | ICD-10-CM | POA: Diagnosis not present

## 2021-09-12 DIAGNOSIS — E538 Deficiency of other specified B group vitamins: Secondary | ICD-10-CM | POA: Diagnosis not present

## 2021-09-12 MED ORDER — CYANOCOBALAMIN 1000 MCG/ML IJ SOLN
1000.0000 ug | Freq: Once | INTRAMUSCULAR | Status: AC
  Administered 2021-09-12: 1000 ug via INTRAMUSCULAR

## 2021-09-12 NOTE — Progress Notes (Signed)
Patient presented for B 12 injection to left deltoid, patient voiced no concerns nor showed any signs of distress during injection. 

## 2021-09-18 DIAGNOSIS — G4733 Obstructive sleep apnea (adult) (pediatric): Secondary | ICD-10-CM | POA: Diagnosis not present

## 2021-09-26 DIAGNOSIS — L821 Other seborrheic keratosis: Secondary | ICD-10-CM | POA: Diagnosis not present

## 2021-09-26 DIAGNOSIS — D2262 Melanocytic nevi of left upper limb, including shoulder: Secondary | ICD-10-CM | POA: Diagnosis not present

## 2021-09-26 DIAGNOSIS — D2272 Melanocytic nevi of left lower limb, including hip: Secondary | ICD-10-CM | POA: Diagnosis not present

## 2021-09-26 DIAGNOSIS — D2261 Melanocytic nevi of right upper limb, including shoulder: Secondary | ICD-10-CM | POA: Diagnosis not present

## 2021-09-26 DIAGNOSIS — D2271 Melanocytic nevi of right lower limb, including hip: Secondary | ICD-10-CM | POA: Diagnosis not present

## 2021-09-26 DIAGNOSIS — D225 Melanocytic nevi of trunk: Secondary | ICD-10-CM | POA: Diagnosis not present

## 2021-09-27 ENCOUNTER — Other Ambulatory Visit: Payer: Self-pay

## 2021-09-27 ENCOUNTER — Other Ambulatory Visit (INDEPENDENT_AMBULATORY_CARE_PROVIDER_SITE_OTHER): Payer: Medicare PPO

## 2021-09-27 DIAGNOSIS — I10 Essential (primary) hypertension: Secondary | ICD-10-CM

## 2021-09-27 DIAGNOSIS — E7800 Pure hypercholesterolemia, unspecified: Secondary | ICD-10-CM | POA: Diagnosis not present

## 2021-09-27 DIAGNOSIS — E119 Type 2 diabetes mellitus without complications: Secondary | ICD-10-CM

## 2021-09-27 LAB — BASIC METABOLIC PANEL
BUN: 10 mg/dL (ref 6–23)
CO2: 31 mEq/L (ref 19–32)
Calcium: 9.9 mg/dL (ref 8.4–10.5)
Chloride: 99 mEq/L (ref 96–112)
Creatinine, Ser: 0.71 mg/dL (ref 0.40–1.20)
GFR: 82.06 mL/min (ref >60.00)
Glucose, Bld: 109 mg/dL — ABNORMAL HIGH (ref 70–99)
Potassium: 3.7 mEq/L (ref 3.5–5.1)
Sodium: 135 mEq/L (ref 135–145)

## 2021-09-27 LAB — CBC WITH DIFFERENTIAL/PLATELET
Basophils Absolute: 0 10*3/uL (ref 0.0–0.1)
Basophils Relative: 0.6 % (ref 0.0–3.0)
Eosinophils Absolute: 0.1 10*3/uL (ref 0.0–0.7)
Eosinophils Relative: 2 % (ref 0.0–5.0)
HCT: 38.8 % (ref 36.0–46.0)
Hemoglobin: 12.8 g/dL (ref 12.0–15.0)
Lymphocytes Relative: 32.6 % (ref 12.0–46.0)
Lymphs Abs: 1.9 10*3/uL (ref 0.7–4.0)
MCHC: 32.9 g/dL (ref 30.0–36.0)
MCV: 92.4 fl (ref 78.0–100.0)
Monocytes Absolute: 1 10*3/uL (ref 0.1–1.0)
Monocytes Relative: 16.6 % — ABNORMAL HIGH (ref 3.0–12.0)
Neutro Abs: 2.8 10*3/uL (ref 1.4–7.7)
Neutrophils Relative %: 48.2 % (ref 43.0–77.0)
Platelets: 281 10*3/uL (ref 150.0–400.0)
RBC: 4.2 Mil/uL (ref 3.87–5.11)
RDW: 14.4 % (ref 11.5–15.5)
WBC: 5.8 10*3/uL (ref 4.0–10.5)

## 2021-09-27 LAB — HEPATIC FUNCTION PANEL
ALT: 13 U/L (ref 0–35)
AST: 20 U/L (ref 0–37)
Albumin: 4 g/dL (ref 3.5–5.2)
Alkaline Phosphatase: 76 U/L (ref 39–117)
Bilirubin, Direct: 0.1 mg/dL (ref 0.0–0.3)
Total Bilirubin: 0.7 mg/dL (ref 0.2–1.2)
Total Protein: 7.1 g/dL (ref 6.0–8.3)

## 2021-09-27 LAB — LIPID PANEL
Cholesterol: 179 mg/dL (ref 0–200)
HDL: 52.3 mg/dL (ref >39.00)
LDL Cholesterol: 103 mg/dL — ABNORMAL HIGH (ref 0–99)
NonHDL: 126.76
Total CHOL/HDL Ratio: 3
Triglycerides: 119 mg/dL (ref 0.0–149.0)
VLDL: 23.8 mg/dL (ref 0.0–40.0)

## 2021-09-27 LAB — TSH: TSH: 1.92 u[IU]/mL (ref 0.35–5.50)

## 2021-09-27 LAB — HEMOGLOBIN A1C: Hgb A1c MFr Bld: 6.2 % (ref 4.6–6.5)

## 2021-09-29 ENCOUNTER — Ambulatory Visit: Payer: Medicare PPO | Admitting: Internal Medicine

## 2021-09-29 ENCOUNTER — Other Ambulatory Visit: Payer: Self-pay

## 2021-09-29 ENCOUNTER — Ambulatory Visit (INDEPENDENT_AMBULATORY_CARE_PROVIDER_SITE_OTHER): Payer: Medicare PPO

## 2021-09-29 VITALS — BP 130/76 | HR 68 | Resp 16 | Ht 63.0 in | Wt 186.0 lb

## 2021-09-29 DIAGNOSIS — Z8639 Personal history of other endocrine, nutritional and metabolic disease: Secondary | ICD-10-CM

## 2021-09-29 DIAGNOSIS — K219 Gastro-esophageal reflux disease without esophagitis: Secondary | ICD-10-CM

## 2021-09-29 DIAGNOSIS — I10 Essential (primary) hypertension: Secondary | ICD-10-CM | POA: Diagnosis not present

## 2021-09-29 DIAGNOSIS — E7800 Pure hypercholesterolemia, unspecified: Secondary | ICD-10-CM | POA: Diagnosis not present

## 2021-09-29 DIAGNOSIS — D649 Anemia, unspecified: Secondary | ICD-10-CM

## 2021-09-29 DIAGNOSIS — G4733 Obstructive sleep apnea (adult) (pediatric): Secondary | ICD-10-CM | POA: Diagnosis not present

## 2021-09-29 DIAGNOSIS — M545 Low back pain, unspecified: Secondary | ICD-10-CM

## 2021-09-29 DIAGNOSIS — E1165 Type 2 diabetes mellitus with hyperglycemia: Secondary | ICD-10-CM

## 2021-09-29 DIAGNOSIS — J4520 Mild intermittent asthma, uncomplicated: Secondary | ICD-10-CM | POA: Diagnosis not present

## 2021-09-29 LAB — HM DIABETES FOOT EXAM

## 2021-09-29 NOTE — Progress Notes (Signed)
Patient ID: Nala Kachel, female   DOB: July 07, 1946, 75 y.o.   MRN: [id number]   Subjective:    Patient ID: Rubie Maid, female    DOB: 1946/08/27, 75 y.o.   MRN: [id number]  This visit occurred during the SARS-CoV-2 public health emergency.  Safety protocols were in place, including screening questions prior to the visit, additional usage of staff PPE, and extensive cleaning of exam room while observing appropriate contact time as indicated for disinfecting solutions.   Patient here for scheduled follow up appt.   HPI Here today for f/u appt.  Here to f/u regarding diabetes, hypertension and hypercholesterolemia. She reports she has been doing relatively well.  Trying to stay active.  No chest pain or sob reported.  Can't clear her throat.  No chest congestion or cough.  No abdominal pain.  Bowels moving.  Low back pain - right side - is better.  Flares intermittently.  Discussed xray given persistent intermittent issue.  Discussed diet and exercise.    Past Medical History:  Diagnosis Date   Diabetes mellitus without complication (HCC)    GERD (gastroesophageal reflux disease)    Hypercholesteremia    Hypertension    Past Surgical History:  Procedure Laterality Date   BREAST BIOPSY     TOOTH EXTRACTION     tooth implantation   TUBAL LIGATION  1979   Family History  Problem Relation Age of Onset   Heart attack Mother    Heart attack Father    Hypercholesterolemia Brother    Diabetes Brother    Cancer Maternal Aunt        breast   Diabetes Maternal Grandmother    Colon cancer Neg Hx    Breast cancer Neg Hx    Social History   Socioeconomic History   Marital status: Widowed    Spouse name: Not on file   Number of children: 2   Years of education: masters   Highest education level: Not on file  Occupational History   Occupation: retired Pharmacist, hospital  Tobacco Use   Smoking status: Former    Packs/day: 1.00    Years: 7.00    Pack years: 7.00    Types: Cigarettes     Quit date: 12/04/1977    Years since quitting: 43.8   Smokeless tobacco: Never  Vaping Use   Vaping Use: Never used  Substance and Sexual Activity   Alcohol use: No    Alcohol/week: 0.0 standard drinks   Drug use: No   Sexual activity: Yes    Birth control/protection: None    Comment: Is sexually active wih same partner x 14 years. Recently found out partner has been cheating.  Other Topics Concern   Not on file  Social History Narrative   Not on file   Social Determinants of Health   Financial Resource Strain: Low Risk    Difficulty of Paying Living Expenses: Not hard at all  Food Insecurity: No Food Insecurity   Worried About Charity fundraiser in the Last Year: Never true   [place] in the Last Year: Never true  Transportation Needs: No Transportation Needs   Lack of Transportation (Medical): No   Lack of Transportation (Non-Medical): No  Physical Activity: Insufficiently Active   Days of Exercise per Week: 7 days   Minutes of Exercise per Session: 20 min  Stress: No Stress Concern Present   Feeling of Stress : Not at all  Social Connections: Unknown   Frequency of Communication with  Friends and Family: More than three times a week   Frequency of Social Gatherings with Friends and Family: More than three times a week   Attends Religious Services: Not on file   Active Member of Clubs or Organizations: Yes   Attends Archivist Meetings: Not on file   Marital Status: Widowed     Review of Systems  Constitutional:  Negative for appetite change and unexpected weight change.  HENT:  Negative for congestion and sinus pressure.   Respiratory:  Negative for cough, chest tightness and shortness of breath.   Cardiovascular:  Negative for chest pain, palpitations and leg swelling.  Gastrointestinal:  Negative for abdominal pain, diarrhea, nausea and vomiting.  Genitourinary:  Negative for difficulty urinating and dysuria.  Musculoskeletal:  Negative for joint  swelling and myalgias.       Low back pain as outlined.   Skin:  Negative for color change and rash.  Neurological:  Negative for dizziness, light-headedness and headaches.  Psychiatric/Behavioral:  Negative for agitation and dysphoric mood.       Objective:     BP 130/76   Pulse 68   Resp 16   Ht '5\' 3"'  (1.6 m)   Wt 186 lb (84.4 kg)   SpO2 98%   BMI 32.95 kg/m  Wt Readings from Last 3 Encounters:  09/29/21 186 lb (84.4 kg)  06/21/21 186 lb 1.6 oz (84.4 kg)  05/30/21 185 lb 12.8 oz (84.3 kg)    Physical Exam Vitals reviewed.  Constitutional:      General: She is not in acute distress.    Appearance: Normal appearance.  HENT:     Head: Normocephalic and atraumatic.     Right Ear: External ear normal.     Left Ear: External ear normal.  Eyes:     General: No scleral icterus.       Right eye: No discharge.        Left eye: No discharge.     Conjunctiva/sclera: Conjunctivae normal.  Neck:     Thyroid: No thyromegaly.  Cardiovascular:     Rate and Rhythm: Normal rate and regular rhythm.  Pulmonary:     Effort: No respiratory distress.     Breath sounds: Normal breath sounds. No wheezing.  Abdominal:     General: Bowel sounds are normal.     Palpations: Abdomen is soft.     Tenderness: There is no abdominal tenderness.  Musculoskeletal:        General: No swelling or tenderness.     Cervical back: Neck supple. No tenderness.  Lymphadenopathy:     Cervical: No cervical adenopathy.  Skin:    Findings: No erythema or rash.  Neurological:     Mental Status: She is alert.  Psychiatric:        Mood and Affect: Mood normal.        Behavior: Behavior normal.     Outpatient Encounter Medications as of 09/29/2021  Medication Sig   albuterol (VENTOLIN HFA) 108 (90 Base) MCG/ACT inhaler Inhale 2 puffs into the lungs every 6 (six) hours as needed.   azelastine (ASTELIN) 0.1 % nasal spray 1 spray in the morning and at bedtime.   docusate sodium (COLACE) 100 MG capsule  Take 1 capsule (100 mg total) by mouth every 12 (twelve) hours.   fluticasone (FLONASE) 50 MCG/ACT nasal spray Place 2 sprays into both nostrils daily.   guaiFENesin (MUCINEX) 600 MG 12 hr tablet Take 2 tablets (1,200 mg total) by mouth  2 (two) times daily as needed for cough or to loosen phlegm.   hydrochlorothiazide (HYDRODIURIL) 25 MG tablet TAKE 1 TABLET(25 MG) BY MOUTH DAILY   meclizine (ANTIVERT) 25 MG tablet Take 1 tablet (25 mg total) by mouth 3 (three) times daily as needed for dizziness.   nystatin cream (MYCOSTATIN) Apply 1 application topically 2 (two) times daily.   pantoprazole (PROTONIX) 40 MG tablet TAKE 1 TABLET(40 MG) BY MOUTH DAILY   pravastatin (PRAVACHOL) 40 MG tablet Take 1 tablet (40 mg total) by mouth daily.   [DISCONTINUED] conjugated estrogens (PREMARIN) vaginal cream Place 1 Applicatorful vaginally daily. Use pea sized amount M-W-Fr before bedtime (Patient not taking: Reported on 09/29/2021)   [DISCONTINUED] nitrofurantoin, macrocrystal-monohydrate, (MACROBID) 100 MG capsule Take 1 capsule (100 mg total) by mouth 2 (two) times daily.   No facility-administered encounter medications on file as of 09/29/2021.     Lab Results  Component Value Date   WBC 5.8 09/27/2021   HGB 12.8 09/27/2021   HCT 38.8 09/27/2021   PLT 281.0 09/27/2021   GLUCOSE 109 (H) 09/27/2021   CHOL 179 09/27/2021   TRIG 119.0 09/27/2021   HDL 52.30 09/27/2021   LDLDIRECT 142.7 12/11/2013   LDLCALC 103 (H) 09/27/2021   ALT 13 09/27/2021   AST 20 09/27/2021   NA 135 09/27/2021   K 3.7 09/27/2021   CL 99 09/27/2021   CREATININE 0.71 09/27/2021   BUN 10 09/27/2021   CO2 31 09/27/2021   TSH 1.92 09/27/2021   HGBA1C 6.2 09/27/2021   MICROALBUR 1.1 09/09/2020    MM 3D SCREEN BREAST BILATERAL  Result Date: 10/14/2020 CLINICAL DATA:  Screening. EXAM: DIGITAL SCREENING BILATERAL MAMMOGRAM WITH TOMO AND CAD COMPARISON:  Previous exam(s). ACR Breast Density Category c: The breast tissue is  heterogeneously dense, which may obscure small masses. FINDINGS: There are no findings suspicious for malignancy. Images were processed with CAD. IMPRESSION: No mammographic evidence of malignancy. A result letter of this screening mammogram will be mailed directly to the patient. RECOMMENDATION: Screening mammogram in one year. (Code:SM-B-01Y) BI-RADS CATEGORY  1: Negative. Electronically Signed   By: Dorise Bullion III M.D   On: 10/14/2020 11:11       Assessment & Plan:   Problem List Items Addressed This Visit     Anemia    Follow cbc.       Asthma    Breathing stable.  Has rescue inhaler if needed.        GERD (gastroesophageal reflux disease)    Continue protonix.  Follow.       Hypercholesterolemia    On pravastatin.  Low cholesterol diet and exercise.  Follow lipid panel and liver function tests.        Relevant Orders   Hepatic function panel   Lipid panel   Hypertension    Continue hctz.  Follow pressures.  Follow metabolic panel.       Low back pain    Persistent intermittent issue.  Check xray.  May need PT referral.       Relevant Orders   DG Lumbar Spine 2-3 Views (Completed)   OSA (obstructive sleep apnea)    Continue cpap.       Type 2 diabetes mellitus with hyperglycemia (HCC)    Low carb diet and exercise given elevated blood sugars. Follow met b and a1c.       Relevant Orders   Basic metabolic panel   Hemoglobin A1c   Other Visit Diagnoses  H/O estrogen deficiency    -  Primary   Relevant Orders   DG Bone Density        Einar Pheasant, MD

## 2021-10-04 ENCOUNTER — Other Ambulatory Visit: Payer: Self-pay | Admitting: Internal Medicine

## 2021-10-04 DIAGNOSIS — M545 Low back pain, unspecified: Secondary | ICD-10-CM

## 2021-10-04 NOTE — Progress Notes (Signed)
Order placed for PT referral.  

## 2021-10-06 ENCOUNTER — Other Ambulatory Visit: Payer: Self-pay | Admitting: Internal Medicine

## 2021-10-06 DIAGNOSIS — Z1231 Encounter for screening mammogram for malignant neoplasm of breast: Secondary | ICD-10-CM

## 2021-10-09 ENCOUNTER — Encounter: Payer: Self-pay | Admitting: Internal Medicine

## 2021-10-09 NOTE — Assessment & Plan Note (Signed)
Continue cpap.  

## 2021-10-09 NOTE — Assessment & Plan Note (Signed)
Breathing stable.  Has rescue inhaler if needed.   

## 2021-10-09 NOTE — Assessment & Plan Note (Addendum)
Low carb diet and exercise given elevated blood sugars. Follow met b and a1c.

## 2021-10-09 NOTE — Assessment & Plan Note (Signed)
On pravastatin.  Low cholesterol diet and exercise.  Follow lipid panel and liver function tests.   

## 2021-10-09 NOTE — Assessment & Plan Note (Signed)
Follow cbc.  

## 2021-10-09 NOTE — Assessment & Plan Note (Signed)
Continue hctz.  Follow pressures.  Follow metabolic panel.  

## 2021-10-09 NOTE — Assessment & Plan Note (Signed)
Persistent intermittent issue.  Check xray.  May need PT referral.

## 2021-10-09 NOTE — Assessment & Plan Note (Signed)
Continue protonix.  Follow.   

## 2021-10-12 ENCOUNTER — Ambulatory Visit: Payer: Medicare PPO | Attending: Internal Medicine | Admitting: Physical Therapy

## 2021-10-12 ENCOUNTER — Encounter: Payer: Self-pay | Admitting: Physical Therapy

## 2021-10-12 DIAGNOSIS — R29898 Other symptoms and signs involving the musculoskeletal system: Secondary | ICD-10-CM | POA: Diagnosis not present

## 2021-10-12 DIAGNOSIS — M5441 Lumbago with sciatica, right side: Secondary | ICD-10-CM

## 2021-10-12 DIAGNOSIS — M5440 Lumbago with sciatica, unspecified side: Secondary | ICD-10-CM | POA: Insufficient documentation

## 2021-10-12 DIAGNOSIS — M545 Low back pain, unspecified: Secondary | ICD-10-CM | POA: Insufficient documentation

## 2021-10-12 DIAGNOSIS — G4733 Obstructive sleep apnea (adult) (pediatric): Secondary | ICD-10-CM | POA: Diagnosis not present

## 2021-10-12 DIAGNOSIS — M6281 Muscle weakness (generalized): Secondary | ICD-10-CM | POA: Diagnosis not present

## 2021-10-12 DIAGNOSIS — G8929 Other chronic pain: Secondary | ICD-10-CM | POA: Diagnosis not present

## 2021-10-12 NOTE — Therapy (Signed)
[facility] [address], [place], [address] Phone: [phone]   Fax:  [phone]  Physical Therapy Evaluation  Patient Details  Name: Lisa Robles MRN: [id number] Date of Birth: 12-24-1945 Referring Provider (PT): Dale Goldston MD  Encounter Date: 10/12/2021   PT End of Session - 10/12/21 1434     Visit Number 1    Number of Visits 17    Date for PT Re-Evaluation 12/07/21    Authorization - Visit Number 1    Progress Note Due on Visit 10    PT Start Time 0940    PT Stop Time 1025    PT Time Calculation (min) 45 min    Activity Tolerance Patient tolerated treatment well    Behavior During Therapy [facility] for tasks assessed/performed             Past Medical History:  Diagnosis Date   Diabetes mellitus without complication (HCC)    GERD (gastroesophageal reflux disease)    Hypercholesteremia    Hypertension     Past Surgical History:  Procedure Laterality Date   BREAST BIOPSY     TOOTH EXTRACTION     tooth implantation   TUBAL LIGATION  1979    There were no vitals filed for this visit.    Subjective Assessment - 10/12/21 1622     Subjective Lisa Robles is a 75 y.o. female presenting to therapy with c/o chronic bilateral LBP R>L. She reports difficulty transfering into her low vehicle, pain with sitting, and walking >20-30 min. Pt also reports having difficulty with bending, lifting, laying on R side, and negotiating stairs.  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 Her goal for therapy is to decrease her LBP in order take her granddaughter to dance and enjoy shopping.  She rates her pain as a 6/10 now and is at its worst. She denies any pain radiating down wither LE. She reports elevating her legs helps to relieve her pain. Pt has a PMH of HTN, DMII, asthma, hypercholesterolemia. Pt denies any unexplained weight fluctuation, saddle paresthesia, loss of  bowel/bladder control or unrelenting night pain at this time. Pt does report having one fall in April 2022 in [place] d/t dehydration and alcohol comsumption.    Pertinent History Lisa Robles is a 75 y.o. female presenting to therapy with c/o chronic bilateral LBP R>L. She reports difficulty transfering into her low vehicle, pain with sitting, and walking >20-30 min. Pt also reports having difficulty with bending, lifting, laying on R side, and negotiating stairs.  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 Her goal for therapy is to decrease her LBP in order take her granddaughter to dance (long car ride) and enjoy shopping.  She rates her pain as a 6/10 now and is at it is currently worst. 0/10 pain at best She denies any pain radiating down her RLE. She reports elevating her legs helps to relieve her pain. Pt has a PMH of HTN, DMII, asthma, hypercholesterolemia. Pt denies any unexplained weight fluctuation, saddle paresthesia, loss of bowel/bladder control or unrelenting night pain at this time. Pt does report having one fall in April 2022 in [place] d/t dehydration and alcohol comsumption.    Limitations Sitting;Walking;Lifting;House hold activities    How long can you sit comfortably? 1-2 hours    How long can you stand comfortably? na    How long can you walk comfortably?  20-30    Diagnostic tests x-ray    Patient Stated Goals patient would like to decrease LBP to shopping    Pain Onset More than a month ago             Objective:   Lumbar AROM   WFL    *No pain with overpressure in all planes     HIP MMT L/R   Flex 4/4  Ext: 4/4  ADD 4/4 (grade given in sitting)  ABD : 4/4  IR : 4/4  ER : 4/4-      Hip AROM L/R   AROM = PROM WNL abd/ad/ER/Ext Except decrease in bilateral hip flexion/IR  Flexion: limited approx 25%/25% IR: limited approx 25%/50%   CPA: L1-L5 normal end feel; normal mobility      Special Tests:    FABER/FADIR (negative bilat)   Scour: (negative bilat)   Sacral Thrust Test (negative)   Straight Leg Raise (negative bilat)  Slump test (negative bilat)  FAIR (positive R LE)   Observation:   Gait: non-antalgic, Bilat trendelenburg gait; indicating decreased lumbopelvic stability. increased hip adduction/IR, knee valgus, and midfoot pronation bilaterally through stance phase    Posture: increased lumbar lordosis, slight thoracic kyphosis,    Palpation: Tender to deep palpation at glute max over R piriformis with flinching to pressure with concordant pain sign  Stair climbing: reciprocal gait with ascending; descending with step to gait d/t lack of eccentric control evidence by increased knee valgus    Sensation: Intact bilaterally   Test and Measures   : 1.40 m/s   5XSTS: 9 sec   Single leg stance: deferred         Objective measurements completed on examination: See above findings.        Ther-Ex PT reviewed the following HEP with patient with patient able to demonstrate a set of the following with min cuing for correction needed. PT educated patient on parameters of therex (how/when to inc/decrease intensity, frequency, rep/set range, stretch hold time, and purpose of therex) with verbalized understanding.     Standing Hip ABD  3 x 8-10 reps 7x/week Piriformis Stretch 3 x 30se 7x/week    [facility] PT Assessment - 10/12/21 0001       Assessment   Medical Diagnosis Chronic R sided LBP    Referring Provider (PT) Dale Elverson MD    Onset Date/Surgical Date 12/04/18    Hand Dominance Right    Next MD Visit none    Prior Therapy none                            PT Education - 10/12/21 1433     Education Details Patient was educated on diagnosis, anatomy and pathology involved, prognosis, role of PT, and was given an HEP, demonstrating exercise with proper form following verbal and tactile cues, and was given a paper hand out to continue  exercise at home. Pt was educated on and agreed to plan of care    Person(s) Educated Patient    Methods Explanation;Demonstration    Comprehension Verbalized understanding;Returned demonstration              PT Short Term Goals - 10/12/21 1449       PT SHORT TERM GOAL #1   Title Pt will be independent with HEP in order to self-manage LBP.    Baseline 10/12/21: HEP Given    Time 4    Period Weeks  Status New    Target Date 11/09/21               PT Long Term Goals - 10/12/21 1450       PT LONG TERM GOAL #1   Title Patient will increase FOTO score to 72 to demonstrate predicted increase in functional mobility to complete ADLs    Baseline 10/12/21: 49    Time 8    Period Weeks    Status New    Target Date 12/07/21      PT LONG TERM GOAL #2   Title Pt will decrease worst pain as reported on NPRS by at least 2 points with ADLS in order to demonstrate clinically significant reduction in pain.    Baseline 10/12/21: 6/10    Time 8    Period Weeks    Status New    Target Date 10/12/21      PT LONG TERM GOAL #3   Title Pt will negotiate 4, 6" stairs with a reciprocal gait pattern without railing to demonstrate increase in functional LE strength and good femoral control.    Baseline 10/12/21: reciprocal ascending/step to needed descending;    Time 8    Period Weeks    Status New    Target Date 12/07/21                    Plan - 10/12/21 1435     Clinical Impression Statement Patient is a 75 y.o. female referred to PT for bilateral LBP. Pt presents to therapy with s/s consistent with piriformis syndrome evidenced by concordant pain with palation and FAIR testing. Patients presents to therapy with decreased LE strength, decreased hip stability, decreased activity tolerance, and pain. Activity limitations include prolonged walking, sitting, bending, lifting, and transfering prohibiting full participation in household and community activites such ascleaning,  driving, and shopping. Patient will benefit from skilled PT to address these impairments to improve quality of life and increase participation with community and household activities.    Personal Factors and Comorbidities Age;Comorbidity 2    Comorbidities HTN, DMII, obesity    Examination-Activity Limitations Transfers;Sit;Lift;Bend;Locomotion Level;Stairs    Examination-Participation Restrictions Community Activity;Driving;Cleaning;Laundry    Stability/Clinical Decision Making Stable/Uncomplicated    Clinical Decision Making Moderate    Rehab Potential Good    PT Frequency 2x / week    PT Duration 8 weeks    PT Treatment/Interventions ADLs/Self Care Home Management;Cryotherapy;Moist Heat;Electrical Stimulation;Traction;Gait training;Stair training;Functional mobility training;Therapeutic activities;Neuromuscular re-education;Balance training;Therapeutic exercise;Patient/family education;Manual techniques;Passive range of motion;Joint Manipulations;Dry needling    PT Next Visit Plan static/dynamic balance, review HEP    PT Home Exercise Plan piriformis stretch, standing hip abduction    Consulted and Agree with Plan of Care Patient             Patient will benefit from skilled therapeutic intervention in order to improve the following deficits and impairments:  Abnormal gait, Decreased balance, Decreased endurance, Decreased activity tolerance, Decreased strength, Decreased range of motion, Hypomobility, Obesity, Pain, Postural dysfunction  Visit Diagnosis: Chronic bilateral low back pain with sciatica, sciatica laterality unspecified - Plan: PT plan of care cert/re-cert     Problem List Patient Active Problem List   Diagnosis Date Noted   Low back pain 09/29/2021   Syncope 06/06/2021   OSA (obstructive sleep apnea) 11/08/2020   Healthcare maintenance 11/08/2020   Fatigue 02/01/2020   Rash 07/06/2019   Chest pain 04/29/2019   Cough 04/29/2019   Asthma 04/29/2019   Frequent  urinary tract infections 01/05/2019   UTI (urinary tract infection) 08/22/2017   Health care maintenance 04/20/2015   Abnormal Pap smear of cervix 05/19/2014   Numbness and tingling in left hand 12/21/2013   GERD (gastroesophageal reflux disease) 11/14/2012   Hypertension 11/14/2012   Hypercholesterolemia 11/14/2012   Type 2 diabetes mellitus with hyperglycemia (HCC) 11/14/2012   Anemia 11/14/2012   B12 deficiency 11/14/2012     Hilda Lias DPT Romilda Joy, SPT  Hilda Lias, PT 10/12/2021, 4:45 PM  [facility] [address] [place], [address] Phone: [phone]   Fax:  [phone]  Name: Lumi Winslett MRN: [id number] Date of Birth: 23-Sep-1946

## 2021-10-13 ENCOUNTER — Ambulatory Visit: Payer: Medicare PPO | Admitting: Physical Therapy

## 2021-10-17 ENCOUNTER — Other Ambulatory Visit: Payer: Self-pay | Admitting: Internal Medicine

## 2021-10-18 ENCOUNTER — Ambulatory Visit: Payer: Medicare PPO | Admitting: Physical Therapy

## 2021-10-18 ENCOUNTER — Encounter: Payer: Self-pay | Admitting: Physical Therapy

## 2021-10-18 DIAGNOSIS — M545 Low back pain, unspecified: Secondary | ICD-10-CM | POA: Diagnosis not present

## 2021-10-18 DIAGNOSIS — M5440 Lumbago with sciatica, unspecified side: Secondary | ICD-10-CM | POA: Diagnosis not present

## 2021-10-18 DIAGNOSIS — R29898 Other symptoms and signs involving the musculoskeletal system: Secondary | ICD-10-CM | POA: Diagnosis not present

## 2021-10-18 DIAGNOSIS — G8929 Other chronic pain: Secondary | ICD-10-CM | POA: Diagnosis not present

## 2021-10-18 DIAGNOSIS — M6281 Muscle weakness (generalized): Secondary | ICD-10-CM | POA: Diagnosis not present

## 2021-10-18 NOTE — Therapy (Signed)
[facility] [address], [place], [address] Phone: [phone]   Fax:  [phone]  Physical Therapy Treatment  Patient Details  Name: Lisa Robles MRN: [id number] Date of Birth: 03/03/1946 Referring Provider (PT): Dale Pickerington MD   Encounter Date: 10/18/2021   PT End of Session - 10/18/21 1042     Visit Number 2    Number of Visits 17    Date for PT Re-Evaluation 12/07/21    Authorization - Visit Number 2    Progress Note Due on Visit 10    PT Start Time 1033    PT Stop Time 1113    PT Time Calculation (min) 40 min    Activity Tolerance Patient tolerated treatment well    Behavior During Therapy [facility] for tasks assessed/performed             Past Medical History:  Diagnosis Date   Diabetes mellitus without complication (HCC)    GERD (gastroesophageal reflux disease)    Hypercholesteremia    Hypertension     Past Surgical History:  Procedure Laterality Date   BREAST BIOPSY     TOOTH EXTRACTION     tooth implantation   TUBAL LIGATION  1979    There were no vitals filed for this visit.   Subjective Assessment - 10/18/21 1035     Subjective Pt reports having some shoulder soreness from cleaning out here attic. She reports active participation with HEP. She reports overall reduction in her pain since first visit from 4/10 to 3/10 overall.    Pertinent History Lisa Robles is a 75 y.o. female presenting to therapy with c/o chronic bilateral LBP R>L. She reports difficulty transfering into her low vehicle, pain with sitting, and walking >20-30 min. Pt also reports having difficulty with bending, lifting, laying on R side, and negotiating stairs.  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 Her goal for therapy is to decrease her LBP in order take her granddaughter to dance (long car ride) and enjoy shopping.  She rates her pain as a 6/10 now and is at it is  currently worst. 0/10 pain at best She denies any pain radiating down her RLE. She reports elevating her legs helps to relieve her pain. Pt has a PMH of HTN, DMII, asthma, hypercholesterolemia. Pt denies any unexplained weight fluctuation, saddle paresthesia, loss of bowel/bladder control or unrelenting night pain at this time. Pt does report having one fall in April 2022 in [place] d/t dehydration and alcohol comsumption.    Limitations Sitting;Walking;Lifting;House hold activities    How long can you sit comfortably? 1-2 hours    How long can you stand comfortably? na    How long can you walk comfortably? 20-30    Diagnostic tests x-ray    Patient Stated Goals patient would like to decrease LBP to shopping    Pain Onset More than a month ago            Therex   Nu Step L2 x 5 min Seat 8 for gentle LE strengthening   Standing Hip Abduction 3 x 10 reps with cues for correction of form   SLS: L: 28.8 sec  R: 12.36 sec   Tandem walking: increased sway, lateral deviations no greater than 5 cm   Seated 4 way Piriformis Stretch  R & L LE 3 x 30 sec with patient cueing and demonstration to correct perform with good carry over  following.   Lateral Eccentric 4" step downs 2 x 6 reps R LE with evidence of increased knee valgus and eccentric control                                PT Education - 10/18/21 1229     Education Details Pt educated on HEP form/technique and single leg balance.    Person(s) Educated Patient    Methods Explanation;Demonstration    Comprehension Verbalized understanding;Returned demonstration              PT Short Term Goals - 10/12/21 1449       PT SHORT TERM GOAL #1   Title Pt will be independent with HEP in order to self-manage LBP.    Baseline 10/12/21: HEP Given    Time 4    Period Weeks    Status New    Target Date 11/09/21               PT Long Term Goals - 10/18/21 1240       PT LONG TERM GOAL #1    Title Patient will increase FOTO score to 72 to demonstrate predicted increase in functional mobility to complete ADLs    Baseline 10/12/21: 49    Time 8    Period Weeks    Status New      PT LONG TERM GOAL #2   Title Pt will decrease worst pain as reported on NPRS by at least 2 points with ADLS in order to demonstrate clinically significant reduction in pain.    Baseline 10/12/21: 6/10    Time 8    Period Weeks    Status New      PT LONG TERM GOAL #3   Title Pt will negotiate 4, 6" stairs with a reciprocal gait pattern without railing to demonstrate increase in functional LE strength and good femoral control.    Baseline 10/12/21: reciprocal ascending/step to needed descending;    Time 8    Period Weeks    Status New      PT LONG TERM GOAL #4   Title Pt will improve single leg stance to 20 sec or greater in order to demonstrate increased LE strength, increased balance, and decrease likelihood of falling    Baseline 10/18/21: L: 28 sec R: 12.3 sec    Time 8    Period Weeks    Status New    Target Date 12/07/21                   Plan - 10/18/21 1230     Clinical Impression Statement PT initiated LE strengthening, mobility, and balance training this session. Pt tolerated session well with no reports of increased pain throughout session. Pt was able to correct form with multimodal cueing with good carry over within session. Pt demonstrated decreased static balance on R LE and decreased eccentric control with step down. Pt continued HEP education and demonstration to ensure proper form and technique. Pt will continue to benefit from skilled therapy to address impairments and achieve set goals.    Personal Factors and Comorbidities Age;Comorbidity 2;Finances    Comorbidities HTN, DMII, obesity    Examination-Activity Limitations Transfers;Sit;Lift;Bend;Locomotion Level;Stairs    Examination-Participation Restrictions Community Activity;Driving;Cleaning;Laundry     Stability/Clinical Decision Making Stable/Uncomplicated    Clinical Decision Making Moderate    Rehab Potential Good    PT Frequency 2x / week  PT Duration 8 weeks    PT Treatment/Interventions ADLs/Self Care Home Management;Cryotherapy;Moist Heat;Electrical Stimulation;Traction;Gait training;Stair training;Functional mobility training;Therapeutic activities;Neuromuscular re-education;Balance training;Therapeutic exercise;Patient/family education;Manual techniques;Passive range of motion;Joint Manipulations;Dry needling    PT Next Visit Plan Eccentric LE control/hip stability and mobility    PT Home Exercise Plan piriformis stretch, standing hip abduction    Consulted and Agree with Plan of Care Patient             Patient will benefit from skilled therapeutic intervention in order to improve the following deficits and impairments:  Abnormal gait, Decreased balance, Decreased endurance, Decreased activity tolerance, Decreased strength, Decreased range of motion, Hypomobility, Obesity, Pain, Postural dysfunction  Visit Diagnosis: Chronic bilateral low back pain with sciatica, sciatica laterality unspecified     Problem List Patient Active Problem List   Diagnosis Date Noted   Low back pain 09/29/2021   Syncope 06/06/2021   OSA (obstructive sleep apnea) 11/08/2020   Healthcare maintenance 11/08/2020   Fatigue 02/01/2020   Rash 07/06/2019   Chest pain 04/29/2019   Cough 04/29/2019   Asthma 04/29/2019   Frequent urinary tract infections 01/05/2019   UTI (urinary tract infection) 08/22/2017   Health care maintenance 04/20/2015   Abnormal Pap smear of cervix 05/19/2014   Numbness and tingling in left hand 12/21/2013   GERD (gastroesophageal reflux disease) 11/14/2012   Hypertension 11/14/2012   Hypercholesterolemia 11/14/2012   Type 2 diabetes mellitus with hyperglycemia (HCC) 11/14/2012   Anemia 11/14/2012   B12 deficiency 11/14/2012      Hilda Lias DPT Romilda Joy, SPT  Hilda Lias, PT 10/18/2021, 4:34 PM  [facility] [address], [place], [address] Phone: [phone]   Fax:  [phone]  Name: Lisa Robles MRN: [id number] Date of Birth: 1945/12/29

## 2021-10-20 ENCOUNTER — Ambulatory Visit: Payer: Medicare PPO | Admitting: Physical Therapy

## 2021-10-20 ENCOUNTER — Encounter: Payer: Self-pay | Admitting: Physical Therapy

## 2021-10-20 DIAGNOSIS — M5440 Lumbago with sciatica, unspecified side: Secondary | ICD-10-CM

## 2021-10-20 DIAGNOSIS — G8929 Other chronic pain: Secondary | ICD-10-CM | POA: Diagnosis not present

## 2021-10-20 DIAGNOSIS — M6281 Muscle weakness (generalized): Secondary | ICD-10-CM | POA: Diagnosis not present

## 2021-10-20 DIAGNOSIS — R29898 Other symptoms and signs involving the musculoskeletal system: Secondary | ICD-10-CM | POA: Diagnosis not present

## 2021-10-20 DIAGNOSIS — M545 Low back pain, unspecified: Secondary | ICD-10-CM | POA: Diagnosis not present

## 2021-10-20 NOTE — Therapy (Signed)
[facility] [address], [place], [address] Phone: [phone]   Fax:  [phone]  Physical Therapy Treatment  Patient Details  Name: Lisa Robles MRN: [id number] Date of Birth: Oct 05, 1946 Referring Provider (PT): Dale [place] MD   Encounter Date: 10/20/2021   PT End of Session - 10/20/21 0823     Visit Number 3    Number of Visits 17    Date for PT Re-Evaluation 12/07/21    Authorization - Visit Number 3    Progress Note Due on Visit 10    PT Start Time 0820    PT Stop Time 0858    PT Time Calculation (min) 38 min    Activity Tolerance Patient tolerated treatment well    Behavior During Therapy [facility] for tasks assessed/performed             Past Medical History:  Diagnosis Date   Diabetes mellitus without complication (HCC)    GERD (gastroesophageal reflux disease)    Hypercholesteremia    Hypertension     Past Surgical History:  Procedure Laterality Date   BREAST BIOPSY     TOOTH EXTRACTION     tooth implantation   TUBAL LIGATION  1979    There were no vitals filed for this visit.   Subjective Assessment - 10/20/21 0824     Subjective Pt reports being a little sore from last visit but her concordant pain is better and she is able to sit/drive longer distances before having pain.    Pertinent History Lisa Robles is a 75 y.o. female presenting to therapy with c/o chronic bilateral LBP R>L. She reports difficulty transfering into her low vehicle, pain with sitting, and walking >20-30 min. Pt also reports having difficulty with bending, lifting, laying on R side, and negotiating stairs.  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 Her goal for therapy is to decrease her LBP in order take her granddaughter to dance (long car ride) and enjoy shopping.  She rates her pain as a 6/10 now and is at it is currently worst. 0/10 pain at best She denies any pain  radiating down her RLE. She reports elevating her legs helps to relieve her pain. Pt has a PMH of HTN, DMII, asthma, hypercholesterolemia. Pt denies any unexplained weight fluctuation, saddle paresthesia, loss of bowel/bladder control or unrelenting night pain at this time. Pt does report having one fall in April 2022 in [place] d/t dehydration and alcohol comsumption.    Limitations Sitting;Walking;Lifting;House hold activities    How long can you sit comfortably? 1-2 hours    How long can you stand comfortably? na    Diagnostic tests x-ray    Patient Stated Goals patient would like to decrease LBP to shopping            Therex    Nu Step L2 x 5 min Seat 8 SPM >90  for gentle LE strengthening    FWD/Lateral Eccentric 4" step downs 2 x 8 reps R LE with evidence of increased knee valgus and eccentric control  Omega Leg Extension 3 x 8-10 # 20 with cues for contraction through full ROM and eccentric control   Monster walks GTB 3 x 10' with continued need of cueing to maintain proper form; pt instructed to maintain knee flexion            PT Education - 10/20/21 0826     Education Details therex  form/technique    Person(s) Educated Patient    Methods Explanation;Demonstration    Comprehension Verbalized understanding;Returned demonstration              PT Short Term Goals - 10/12/21 1449       PT SHORT TERM GOAL #1   Title Pt will be independent with HEP in order to self-manage LBP.    Baseline 10/12/21: HEP Given    Time 4    Period Weeks    Status New    Target Date 11/09/21               PT Long Term Goals - 10/18/21 1240       PT LONG TERM GOAL #1   Title Patient will increase FOTO score to 72 to demonstrate predicted increase in functional mobility to complete ADLs    Baseline 10/12/21: 49    Time 8    Period Weeks    Status New      PT LONG TERM GOAL #2   Title Pt will decrease worst pain as reported on NPRS by at least 2 points with ADLS in  order to demonstrate clinically significant reduction in pain.    Baseline 10/12/21: 6/10    Time 8    Period Weeks    Status New      PT LONG TERM GOAL #3   Title Pt will negotiate 4, 6" stairs with a reciprocal gait pattern without railing to demonstrate increase in functional LE strength and good femoral control.    Baseline 10/12/21: reciprocal ascending/step to needed descending;    Time 8    Period Weeks    Status New      PT LONG TERM GOAL #4   Title Pt will improve single leg stance to 20 sec or greater in order to demonstrate increased LE strength, increased balance, and decrease likelihood of falling    Baseline 10/18/21: L: 28 sec R: 12.3 sec    Time 8    Period Weeks    Status New    Target Date 12/07/21                   Plan - 10/20/21 1103     Clinical Impression Statement PT continued LE strengthening and motor control training this session. Pt tolerated session well with no reports of increased pain throughout session. Pt needed continued cueing to correct form of therex. Pt continues to demonstrate decreased static balance and strength on R LE evidenced by decreased eccentric control with FWD/LAT step downs.. Pt will continue to benefit from skilled therapy to address impairments and achieve set goals. Continue PT POC.    Personal Factors and Comorbidities Age;Comorbidity 2;Finances    Comorbidities HTN, DMII, obesity    Examination-Activity Limitations Transfers;Sit;Lift;Bend;Locomotion Level;Stairs    Examination-Participation Restrictions Community Activity;Driving;Cleaning;Laundry    Stability/Clinical Decision Making Stable/Uncomplicated    Clinical Decision Making Moderate    Rehab Potential Good    PT Frequency 2x / week    PT Duration 8 weeks    PT Treatment/Interventions ADLs/Self Care Home Management;Cryotherapy;Moist Heat;Electrical Stimulation;Traction;Gait training;Stair training;Functional mobility training;Therapeutic activities;Neuromuscular  re-education;Balance training;Therapeutic exercise;Patient/family education;Manual techniques;Passive range of motion;Joint Manipulations;Dry needling    PT Next Visit Plan Eccentric LE control/hip stability and mobility    PT Home Exercise Plan piriformis stretch, standing hip abduction    Consulted and Agree with Plan of Care Patient             Patient will benefit from  skilled therapeutic intervention in order to improve the following deficits and impairments:  Abnormal gait, Decreased balance, Decreased endurance, Decreased activity tolerance, Decreased strength, Decreased range of motion, Hypomobility, Obesity, Pain, Postural dysfunction  Visit Diagnosis: Chronic bilateral low back pain with sciatica, sciatica laterality unspecified     Problem List Patient Active Problem List   Diagnosis Date Noted   Low back pain 09/29/2021   Syncope 06/06/2021   OSA (obstructive sleep apnea) 11/08/2020   Healthcare maintenance 11/08/2020   Fatigue 02/01/2020   Rash 07/06/2019   Chest pain 04/29/2019   Cough 04/29/2019   Asthma 04/29/2019   Frequent urinary tract infections 01/05/2019   UTI (urinary tract infection) 08/22/2017   Health care maintenance 04/20/2015   Abnormal Pap smear of cervix 05/19/2014   Numbness and tingling in left hand 12/21/2013   GERD (gastroesophageal reflux disease) 11/14/2012   Hypertension 11/14/2012   Hypercholesterolemia 11/14/2012   Type 2 diabetes mellitus with hyperglycemia (HCC) 11/14/2012   Anemia 11/14/2012   B12 deficiency 11/14/2012     Hilda Lias DPT Romilda Joy, SPT  Hilda Lias, PT 10/21/2021, 9:27 AM   [facility] [address], [place], [address] Phone: [phone]   Fax:  [phone]  Name: Lisa Robles MRN: [id number] Date of Birth: 1946-11-23

## 2021-10-24 ENCOUNTER — Ambulatory Visit: Payer: Medicare PPO | Admitting: Physical Therapy

## 2021-10-24 ENCOUNTER — Encounter: Payer: Self-pay | Admitting: Physical Therapy

## 2021-10-24 DIAGNOSIS — R29898 Other symptoms and signs involving the musculoskeletal system: Secondary | ICD-10-CM | POA: Diagnosis not present

## 2021-10-24 DIAGNOSIS — M5440 Lumbago with sciatica, unspecified side: Secondary | ICD-10-CM | POA: Diagnosis not present

## 2021-10-24 DIAGNOSIS — G8929 Other chronic pain: Secondary | ICD-10-CM | POA: Diagnosis not present

## 2021-10-24 DIAGNOSIS — M6281 Muscle weakness (generalized): Secondary | ICD-10-CM | POA: Diagnosis not present

## 2021-10-24 DIAGNOSIS — M545 Low back pain, unspecified: Secondary | ICD-10-CM | POA: Diagnosis not present

## 2021-10-24 NOTE — Therapy (Signed)
[facility] [address] [place], [address] Phone: [phone]   Fax:  [phone]  Physical Therapy Treatment  Patient Details  Name: Lisa Robles MRN: [id number] Date of Birth: 1946/10/03 Referring Provider (PT): Dale [place] MD   Encounter Date: 10/24/2021   PT End of Session - 10/24/21 0833     Visit Number 4    Number of Visits 17    Date for PT Re-Evaluation 12/07/21    Authorization - Visit Number 4    Progress Note Due on Visit 10    PT Start Time 0817    PT Stop Time 0855    PT Time Calculation (min) 38 min    Activity Tolerance Patient tolerated treatment well    Behavior During Therapy [facility] for tasks assessed/performed             Past Medical History:  Diagnosis Date   Diabetes mellitus without complication (HCC)    GERD (gastroesophageal reflux disease)    Hypercholesteremia    Hypertension     Past Surgical History:  Procedure Laterality Date   BREAST BIOPSY     TOOTH EXTRACTION     tooth implantation   TUBAL LIGATION  1979    There were no vitals filed for this visit.   Subjective Assessment - 10/24/21 0821     Subjective Pt reports her LBP is improving, and has less pain with driving long distances, but it is still present. Reports 4/10 pain today. Compliant with HEP.    Pertinent History Lisa Robles is a 75 y.o. female presenting to therapy with c/o chronic bilateral LBP R>L. She reports difficulty transfering into her low vehicle, pain with sitting, and walking >20-30 min. Pt also reports having difficulty with bending, lifting, laying on R side, and negotiating stairs.  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 Her goal for therapy is to decrease her LBP in order take her granddaughter to dance (long car ride) and enjoy shopping.  She rates her pain as a 6/10 now and is at it is currently worst. 0/10 pain at best She denies any pain  radiating down her RLE. She reports elevating her legs helps to relieve her pain. Pt has a PMH of HTN, DMII, asthma, hypercholesterolemia. Pt denies any unexplained weight fluctuation, saddle paresthesia, loss of bowel/bladder control or unrelenting night pain at this time. Pt does report having one fall in April 2022 in [place] d/t dehydration and alcohol comsumption.    Limitations Sitting;Walking;Lifting;House hold activities    How long can you sit comfortably? 1-2 hours    How long can you stand comfortably? na    How long can you walk comfortably? 20-30    Diagnostic tests x-ray    Patient Stated Goals patient would like to decrease LBP to shopping    Pain Onset More than a month ago             Therex    Nu Step L2 x 5 min Seat 8 SPM >90  for gentle LE strengthening    FWD/Lateral Eccentric 3" step downs 2x 10 reps with max cuing to maintain standing LE heel contact, and for controlled lower of CLLE  Lateral step down from 3" step 2x 10 with demonstrative and VC for technique with LE alignment, min difficulty with balance supervision for safety  Squat to elevated mat table with GTB at knees x10; from standard chair x8 with  pt 1in from chair on most reps, good carry over of cuing for proper technique  SLS R: 2-3sec L 8sec  Updated HEP to reflect standing abd with band (patient able to demonstrate a set of this)  Access Code: [email or web address] Seated Figure 4 Piriformis Stretch - 2-3 x daily - 7 x weekly - 30sec hold Single Leg Stance with Support - 2 x daily - 7 x weekly - 30sec hold Squat with Chair Touch and Resistance Loop - 1 x daily - 2 x weekly - 3 sets - 10 reps Standing Hip Abduction with Resistance at Ankles and Counter Support - 1 x daily - 7 x weekly - 3 sets - 10 reps                             PT Education - 10/24/21 0831     Education Details therex form/technique    Person(s) Educated Patient    Methods Explanation;Demonstration;Verbal  cues    Comprehension Verbalized understanding;Returned demonstration;Verbal cues required              PT Short Term Goals - 10/12/21 1449       PT SHORT TERM GOAL #1   Title Pt will be independent with HEP in order to self-manage LBP.    Baseline 10/12/21: HEP Given    Time 4    Period Weeks    Status New    Target Date 11/09/21               PT Long Term Goals - 10/18/21 1240       PT LONG TERM GOAL #1   Title Patient will increase FOTO score to 72 to demonstrate predicted increase in functional mobility to complete ADLs    Baseline 10/12/21: 49    Time 8    Period Weeks    Status New      PT LONG TERM GOAL #2   Title Pt will decrease worst pain as reported on NPRS by at least 2 points with ADLS in order to demonstrate clinically significant reduction in pain.    Baseline 10/12/21: 6/10    Time 8    Period Weeks    Status New      PT LONG TERM GOAL #3   Title Pt will negotiate 4, 6" stairs with a reciprocal gait pattern without railing to demonstrate increase in functional LE strength and good femoral control.    Baseline 10/12/21: reciprocal ascending/step to needed descending;    Time 8    Period Weeks    Status New      PT LONG TERM GOAL #4   Title Pt will improve single leg stance to 20 sec or greater in order to demonstrate increased LE strength, increased balance, and decrease likelihood of falling    Baseline 10/18/21: L: 28 sec R: 12.3 sec    Time 8    Period Weeks    Status New    Target Date 12/07/21                   Plan - 10/24/21 0900     Clinical Impression Statement PT continued bilat hip and core strengthening, with patient demonstrating decreased strength and balance R>L. Pt is able to comply with multimodal cuing for proper technique of therex without increased pain, and with good motivation througout session. PT updated HEP to reflect current status with patient able to demonstrate and verbalize understadning.  PT will continue  progression as able.    Personal Factors and Comorbidities Age;Comorbidity 2;Finances    Comorbidities HTN, DMII, obesity    Examination-Activity Limitations Transfers;Sit;Lift;Bend;Locomotion Level;Stairs    Examination-Participation Restrictions Community Activity;Driving;Cleaning;Laundry    Stability/Clinical Decision Making Stable/Uncomplicated    Clinical Decision Making Moderate    Rehab Potential Good    PT Frequency 2x / week    PT Duration 8 weeks    PT Treatment/Interventions ADLs/Self Care Home Management;Cryotherapy;Moist Heat;Electrical Stimulation;Traction;Gait training;Stair training;Functional mobility training;Therapeutic activities;Neuromuscular re-education;Balance training;Therapeutic exercise;Patient/family education;Manual techniques;Passive range of motion;Joint Manipulations;Dry needling    PT Next Visit Plan Eccentric LE control/hip stability and mobility    PT Home Exercise Plan piriformis stretch, standing hip abduction    Consulted and Agree with Plan of Care Patient             Patient will benefit from skilled therapeutic intervention in order to improve the following deficits and impairments:  Abnormal gait, Decreased balance, Decreased endurance, Decreased activity tolerance, Decreased strength, Decreased range of motion, Hypomobility, Obesity, Pain, Postural dysfunction  Visit Diagnosis: Chronic bilateral low back pain with sciatica, sciatica laterality unspecified     Problem List Patient Active Problem List   Diagnosis Date Noted   Low back pain 09/29/2021   Syncope 06/06/2021   OSA (obstructive sleep apnea) 11/08/2020   Healthcare maintenance 11/08/2020   Fatigue 02/01/2020   Rash 07/06/2019   Chest pain 04/29/2019   Cough 04/29/2019   Asthma 04/29/2019   Frequent urinary tract infections 01/05/2019   UTI (urinary tract infection) 08/22/2017   Health care maintenance 04/20/2015   Abnormal Pap smear of cervix 05/19/2014   Numbness and  tingling in left hand 12/21/2013   GERD (gastroesophageal reflux disease) 11/14/2012   Hypertension 11/14/2012   Hypercholesterolemia 11/14/2012   Type 2 diabetes mellitus with hyperglycemia (HCC) 11/14/2012   Anemia 11/14/2012   B12 deficiency 11/14/2012   Hilda Lias DPT Hilda Lias, PT 10/24/2021, 9:41 AM  [facility] [address], [place], [address] Phone: [phone]   Fax:  [phone]  Name: Lisa Robles MRN: [id number] Date of Birth: 07-01-1946

## 2021-10-26 ENCOUNTER — Ambulatory Visit: Payer: Medicare PPO | Admitting: Physical Therapy

## 2021-10-26 DIAGNOSIS — M545 Low back pain, unspecified: Secondary | ICD-10-CM | POA: Diagnosis not present

## 2021-10-26 DIAGNOSIS — M5440 Lumbago with sciatica, unspecified side: Secondary | ICD-10-CM

## 2021-10-26 DIAGNOSIS — G8929 Other chronic pain: Secondary | ICD-10-CM | POA: Diagnosis not present

## 2021-10-26 DIAGNOSIS — R29898 Other symptoms and signs involving the musculoskeletal system: Secondary | ICD-10-CM | POA: Diagnosis not present

## 2021-10-26 DIAGNOSIS — M6281 Muscle weakness (generalized): Secondary | ICD-10-CM | POA: Diagnosis not present

## 2021-10-26 NOTE — Therapy (Signed)
[facility] [address] [place], [address] Phone: [phone]   Fax:  [phone]  Physical Therapy Treatment  Patient Details  Name: Lisa Robles MRN: [id number] Date of Birth: [birth year] Referring Provider (PT): Dale Clarendon MD   Encounter Date: 10/26/2021    Past Medical History:  Diagnosis Date   Diabetes mellitus without complication (HCC)    GERD (gastroesophageal reflux disease)    Hypercholesteremia    Hypertension     Past Surgical History:  Procedure Laterality Date   BREAST BIOPSY     TOOTH EXTRACTION     tooth implantation   TUBAL LIGATION  1979    There were no vitals filed for this visit.     Therex    Nu Step L2 x 5 min Seat 8 SPM >90  for gentle LE strengthening    Squat with GTB at knees with standard chair 3x 8 for TC of depth with min cuing for technique without excessive anterior tibial translation with good carry over  Sidestepping with GTB resistance x10 steps each direction; with 7# DB held out front (90d flexion) for increased core demand 2x 10 steps each direction; good carry over of cuing for eccentric control  Hip hinge x12 with demo and cuing for proper technique with good carry over With 7# DB bilat 2x 10 with good carry over of technique with resistance following max multimodal cuing at initiation of adding resistance                              PT Short Term Goals - 10/12/21 1449       PT SHORT TERM GOAL #1   Title Pt will be independent with HEP in order to self-manage LBP.    Baseline 10/12/21: HEP Given    Time 4    Period Weeks    Status New    Target Date 11/09/21               PT Long Term Goals - 10/18/21 1240       PT LONG TERM GOAL #1   Title Patient will increase FOTO score to 72 to demonstrate predicted increase in functional mobility to complete ADLs    Baseline 10/12/21: 49    Time 8    Period  Weeks    Status New      PT LONG TERM GOAL #2   Title Pt will decrease worst pain as reported on NPRS by at least 2 points with ADLS in order to demonstrate clinically significant reduction in pain.    Baseline 10/12/21: 6/10    Time 8    Period Weeks    Status New      PT LONG TERM GOAL #3   Title Pt will negotiate 4, 6" stairs with a reciprocal gait pattern without railing to demonstrate increase in functional LE strength and good femoral control.    Baseline 10/12/21: reciprocal ascending/step to needed descending;    Time 8    Period Weeks    Status New      PT LONG TERM GOAL #4   Title Pt will improve single leg stance to 20 sec or greater in order to demonstrate increased LE strength, increased balance, and decrease likelihood of falling    Baseline 10/18/21: L: 28 sec R: 12.3 sec    Time 8    Period Weeks    Status  New    Target Date 12/07/21                    Patient will benefit from skilled therapeutic intervention in order to improve the following deficits and impairments:     Visit Diagnosis: No diagnosis found.     Problem List Patient Active Problem List   Diagnosis Date Noted   Low back pain 09/29/2021   Syncope 06/06/2021   OSA (obstructive sleep apnea) 11/08/2020   Healthcare maintenance 11/08/2020   Fatigue 02/01/2020   Rash 07/06/2019   Chest pain 04/29/2019   Cough 04/29/2019   Asthma 04/29/2019   Frequent urinary tract infections 01/05/2019   UTI (urinary tract infection) 08/22/2017   Health care maintenance 04/20/2015   Abnormal Pap smear of cervix 05/19/2014   Numbness and tingling in left hand 12/21/2013   GERD (gastroesophageal reflux disease) 11/14/2012   Hypertension 11/14/2012   Hypercholesterolemia 11/14/2012   Type 2 diabetes mellitus with hyperglycemia (HCC) 11/14/2012   Anemia 11/14/2012   B12 deficiency 11/14/2012   Hilda Lias DPT Hilda Lias, PT 10/26/2021, 9:02 AM  [facility] [address], [place], [address] Phone: [phone]   Fax:  [phone]  Name: Lisa Robles MRN: [id number] Date of Birth: 1946/04/24

## 2021-11-02 ENCOUNTER — Ambulatory Visit: Payer: Medicare PPO | Admitting: Physical Therapy

## 2021-11-02 ENCOUNTER — Encounter: Payer: Self-pay | Admitting: Physical Therapy

## 2021-11-02 DIAGNOSIS — G8929 Other chronic pain: Secondary | ICD-10-CM

## 2021-11-02 DIAGNOSIS — M6281 Muscle weakness (generalized): Secondary | ICD-10-CM | POA: Diagnosis not present

## 2021-11-02 DIAGNOSIS — M5440 Lumbago with sciatica, unspecified side: Secondary | ICD-10-CM

## 2021-11-02 DIAGNOSIS — R29898 Other symptoms and signs involving the musculoskeletal system: Secondary | ICD-10-CM | POA: Diagnosis not present

## 2021-11-02 DIAGNOSIS — M545 Low back pain, unspecified: Secondary | ICD-10-CM | POA: Diagnosis not present

## 2021-11-02 NOTE — Therapy (Signed)
[facility] [address] [place], [address] Phone: [phone]   Fax:  [phone]  Physical Therapy Treatment  Patient Details  Name: Lisa Robles MRN: [id number] Date of Birth: 1946/06/28 Referring Provider (PT): Dale Jasper MD   Encounter Date: 11/02/2021    Past Medical History:  Diagnosis Date   Diabetes mellitus without complication (HCC)    GERD (gastroesophageal reflux disease)    Hypercholesteremia    Hypertension     Past Surgical History:  Procedure Laterality Date   BREAST BIOPSY     TOOTH EXTRACTION     tooth implantation   TUBAL LIGATION  1979    There were no vitals filed for this visit.    Therex    Nu Step L2 x 5 min Seat 8 SPM >90  for gentle LE strengthening   Hip hike x12 each LE with demo and max cuing needed for proper technique of therex ; From 3in step 2x 10 with increased difficulty with LLE, increased UE support needed for LLE  SLS best time RLE 12sec; LLE 25sec (3 trials) education on continued practice  Hip abd on slider in mini squat 2x 10 bilat with increased difficulty with standing on RLE with LLE movement  Squat with GTB at knees with standard chair 2x 10 for TC of depth with min cuing for technique, good carry over from last visit                                               PT Short Term Goals - 10/12/21 1449       PT SHORT TERM GOAL #1   Title Pt will be independent with HEP in order to self-manage LBP.    Baseline 10/12/21: HEP Given    Time 4    Period Weeks    Status New    Target Date 11/09/21               PT Long Term Goals - 10/18/21 1240       PT LONG TERM GOAL #1   Title Patient will increase FOTO score to 72 to demonstrate predicted increase in functional mobility to complete ADLs    Baseline 10/12/21: 49    Time 8    Period Weeks    Status New      PT LONG TERM GOAL #2   Title Pt  will decrease worst pain as reported on NPRS by at least 2 points with ADLS in order to demonstrate clinically significant reduction in pain.    Baseline 10/12/21: 6/10    Time 8    Period Weeks    Status New      PT LONG TERM GOAL #3   Title Pt will negotiate 4, 6" stairs with a reciprocal gait pattern without railing to demonstrate increase in functional LE strength and good femoral control.    Baseline 10/12/21: reciprocal ascending/step to needed descending;    Time 8    Period Weeks    Status New      PT LONG TERM GOAL #4   Title Pt will improve single leg stance to 20 sec or greater in order to demonstrate increased LE strength, increased balance, and decrease likelihood of falling    Baseline 10/18/21: L: 28 sec R: 12.3 sec    Time 8  Period Weeks    Status New    Target Date 12/07/21                    Patient will benefit from skilled therapeutic intervention in order to improve the following deficits and impairments:     Visit Diagnosis: No diagnosis found.     Problem List Patient Active Problem List   Diagnosis Date Noted   Low back pain 09/29/2021   Syncope 06/06/2021   OSA (obstructive sleep apnea) 11/08/2020   Healthcare maintenance 11/08/2020   Fatigue 02/01/2020   Rash 07/06/2019   Chest pain 04/29/2019   Cough 04/29/2019   Asthma 04/29/2019   Frequent urinary tract infections 01/05/2019   UTI (urinary tract infection) 08/22/2017   Health care maintenance 04/20/2015   Abnormal Pap smear of cervix 05/19/2014   Numbness and tingling in left hand 12/21/2013   GERD (gastroesophageal reflux disease) 11/14/2012   Hypertension 11/14/2012   Hypercholesterolemia 11/14/2012   Type 2 diabetes mellitus with hyperglycemia (HCC) 11/14/2012   Anemia 11/14/2012   B12 deficiency 11/14/2012   Hilda Lias DPT Hilda Lias, PT 11/02/2021, 7:36 AM  [facility] [address], [place], [address] Phone: [phone]   Fax:  [phone]  Name: Lisa Robles MRN: [id number] Date of Birth: 1946/01/14

## 2021-11-03 ENCOUNTER — Ambulatory Visit (INDEPENDENT_AMBULATORY_CARE_PROVIDER_SITE_OTHER): Payer: Medicare PPO

## 2021-11-03 ENCOUNTER — Other Ambulatory Visit: Payer: Self-pay

## 2021-11-03 DIAGNOSIS — E538 Deficiency of other specified B group vitamins: Secondary | ICD-10-CM | POA: Diagnosis not present

## 2021-11-03 MED ORDER — CYANOCOBALAMIN 1000 MCG/ML IJ SOLN
1000.0000 ug | Freq: Once | INTRAMUSCULAR | Status: AC
Start: 2021-11-03 — End: 2021-11-03
  Administered 2021-11-03: 1000 ug via INTRAMUSCULAR

## 2021-11-03 NOTE — Progress Notes (Signed)
Patient presented for B 12 injection to right deltoid, patient voiced no concerns nor showed any signs of distress during injection. 

## 2021-11-04 ENCOUNTER — Encounter: Payer: Self-pay | Admitting: Physical Therapy

## 2021-11-04 ENCOUNTER — Ambulatory Visit: Payer: Medicare PPO | Attending: Internal Medicine | Admitting: Physical Therapy

## 2021-11-04 DIAGNOSIS — M5440 Lumbago with sciatica, unspecified side: Secondary | ICD-10-CM | POA: Diagnosis not present

## 2021-11-04 DIAGNOSIS — M6281 Muscle weakness (generalized): Secondary | ICD-10-CM | POA: Diagnosis not present

## 2021-11-04 DIAGNOSIS — G8929 Other chronic pain: Secondary | ICD-10-CM | POA: Insufficient documentation

## 2021-11-04 NOTE — Therapy (Signed)
[facility] [address] [place], [address] Phone: [phone]   Fax:  [phone]  Physical Therapy Treatment  Patient Details  Name: Lisa Robles MRN: [id number] Date of Birth: Apr 14, 1946 Referring Provider (PT): Dale Hosmer MD   Encounter Date: 11/04/2021   PT End of Session - 11/04/21 1027     Visit Number 7    Number of Visits 17    Date for PT Re-Evaluation 12/07/21    Authorization - Visit Number 7    Progress Note Due on Visit 10    PT Start Time 1024    PT Stop Time 1105    PT Time Calculation (min) 41 min    Activity Tolerance Patient tolerated treatment well    Behavior During Therapy [facility] for tasks assessed/performed             Past Medical History:  Diagnosis Date   Diabetes mellitus without complication (HCC)    GERD (gastroesophageal reflux disease)    Hypercholesteremia    Hypertension     Past Surgical History:  Procedure Laterality Date   BREAST BIOPSY     TOOTH EXTRACTION     tooth implantation   TUBAL LIGATION  1979    There were no vitals filed for this visit.   Subjective Assessment - 11/04/21 1028     Subjective Reports she did not have an increase in pain with riding to [place] and and back which is an improvement. She is completing her HEP and reports no pain this am.    Pertinent History Lisa Robles is a 75 y.o. female presenting to therapy with c/o chronic bilateral LBP R>L. She reports difficulty transfering into her low vehicle, pain with sitting, and walking >20-30 min. Pt also reports having difficulty with bending, lifting, laying on R side, and negotiating stairs.  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 Her goal for therapy is to decrease her LBP in order take her granddaughter to dance (long car ride) and enjoy shopping.  She rates her pain as a 6/10 now and is at it is currently worst. 0/10 pain at best She  denies any pain radiating down her RLE. She reports elevating her legs helps to relieve her pain. Pt has a PMH of HTN, DMII, asthma, hypercholesterolemia. Pt denies any unexplained weight fluctuation, saddle paresthesia, loss of bowel/bladder control or unrelenting night pain at this time. Pt does report having one fall in April 2022 in [place] d/t dehydration and alcohol comsumption.    Limitations Sitting;Walking;Lifting;House hold activities    How long can you sit comfortably? 1-2 hours    How long can you stand comfortably? na    How long can you walk comfortably? 20-30    Diagnostic tests x-ray    Patient Stated Goals patient would like to decrease LBP to shopping    Pain Onset More than a month ago              Therex    Nu Step L2 x 5 min Seat 8 SPM >90  for gentle LE strengthening    Lateral step down from 3in step 2x 10 with increased difficulty with LLE, increased UE support needed for LLE   Step up to 6in step 2x 8 with evident difficult on LLE > R with complying with "no toe push off of CLLE" and with eccentric control to lower  Monster walk with RTB 3x 60ft with  demo and max cuing for technique with good carry over   Semi tandem on foam drawing  alphabet with hands clasped; supervision for safety                            PT Education - 11/04/21 1026     Education Details therex form/technique    Person(s) Educated Patient    Methods Explanation;Demonstration;Verbal cues    Comprehension Verbalized understanding;Returned demonstration;Verbal cues required              PT Short Term Goals - 10/12/21 1449       PT SHORT TERM GOAL #1   Title Pt will be independent with HEP in order to self-manage LBP.    Baseline 10/12/21: HEP Given    Time 4    Period Weeks    Status New    Target Date 11/09/21               PT Long Term Goals - 10/18/21 1240       PT LONG TERM GOAL #1   Title Patient will increase FOTO score to 72  to demonstrate predicted increase in functional mobility to complete ADLs    Baseline 10/12/21: 49    Time 8    Period Weeks    Status New      PT LONG TERM GOAL #2   Title Pt will decrease worst pain as reported on NPRS by at least 2 points with ADLS in order to demonstrate clinically significant reduction in pain.    Baseline 10/12/21: 6/10    Time 8    Period Weeks    Status New      PT LONG TERM GOAL #3   Title Pt will negotiate 4, 6" stairs with a reciprocal gait pattern without railing to demonstrate increase in functional LE strength and good femoral control.    Baseline 10/12/21: reciprocal ascending/step to needed descending;    Time 8    Period Weeks    Status New      PT LONG TERM GOAL #4   Title Pt will improve single leg stance to 20 sec or greater in order to demonstrate increased LE strength, increased balance, and decrease likelihood of falling    Baseline 10/18/21: L: 28 sec R: 12.3 sec    Time 8    Period Weeks    Status New    Target Date 12/07/21                   Plan - 11/04/21 1050     Clinical Impression Statement PT continued therex progression for increased hip and core strenghtening with success. Paitent requires multimodal cuing for technique of most therex with excellent compliance and motivaiton throughout session. Evident difficulty with LLE stability and strength > RLE with single limb exercises. Pt reports no increase in pain throughout session. PT will continue progression as able.    Personal Factors and Comorbidities Age;Comorbidity 2;Finances    Comorbidities HTN, DMII, obesity    Examination-Activity Limitations Transfers;Sit;Lift;Bend;Locomotion Level;Stairs    Examination-Participation Restrictions Community Activity;Driving;Cleaning;Laundry    Stability/Clinical Decision Making Evolving/Moderate complexity    Clinical Decision Making Moderate    Rehab Potential Good    PT Treatment/Interventions ADLs/Self Care Home  Management;Cryotherapy;Moist Heat;Electrical Stimulation;Traction;Gait training;Stair training;Functional mobility training;Therapeutic activities;Neuromuscular re-education;Balance training;Therapeutic exercise;Patient/family education;Manual techniques;Passive range of motion;Joint Manipulations;Dry needling    PT Next Visit Plan Eccentric LE control/hip stability and mobility  PT Home Exercise Plan piriformis stretch, standing hip abduction    Consulted and Agree with Plan of Care Patient             Patient will benefit from skilled therapeutic intervention in order to improve the following deficits and impairments:  Abnormal gait, Decreased balance, Decreased endurance, Decreased activity tolerance, Decreased strength, Decreased range of motion, Hypomobility, Obesity, Pain, Postural dysfunction  Visit Diagnosis: Chronic bilateral low back pain with sciatica, sciatica laterality unspecified     Problem List Patient Active Problem List   Diagnosis Date Noted   Low back pain 09/29/2021   Syncope 06/06/2021   OSA (obstructive sleep apnea) 11/08/2020   Healthcare maintenance 11/08/2020   Fatigue 02/01/2020   Rash 07/06/2019   Chest pain 04/29/2019   Cough 04/29/2019   Asthma 04/29/2019   Frequent urinary tract infections 01/05/2019   UTI (urinary tract infection) 08/22/2017   Health care maintenance 04/20/2015   Abnormal Pap smear of cervix 05/19/2014   Numbness and tingling in left hand 12/21/2013   GERD (gastroesophageal reflux disease) 11/14/2012   Hypertension 11/14/2012   Hypercholesterolemia 11/14/2012   Type 2 diabetes mellitus with hyperglycemia (HCC) 11/14/2012   Anemia 11/14/2012   B12 deficiency 11/14/2012   Hilda Lias DPT Hilda Lias, PT 11/04/2021, 11:03 AM  [facility] [address] [place], [address] Phone: [phone]   Fax:  [phone]  Name: Lisa Robles MRN:  [id number] Date of Birth: 01/27/1946

## 2021-11-07 ENCOUNTER — Encounter: Payer: Self-pay | Admitting: Physical Therapy

## 2021-11-07 ENCOUNTER — Ambulatory Visit: Payer: Medicare PPO | Admitting: Physical Therapy

## 2021-11-07 DIAGNOSIS — G8929 Other chronic pain: Secondary | ICD-10-CM

## 2021-11-07 DIAGNOSIS — M5440 Lumbago with sciatica, unspecified side: Secondary | ICD-10-CM | POA: Diagnosis not present

## 2021-11-07 DIAGNOSIS — M6281 Muscle weakness (generalized): Secondary | ICD-10-CM | POA: Diagnosis not present

## 2021-11-07 NOTE — Therapy (Signed)
[place] [facility] [address], [place], [address] Phone: [phone]   Fax:  [phone]  Physical Therapy Treatment/Discharge Summary  Reporting 10/12/21 - 11/07/21  Patient Details  Name: Lisa Robles MRN: [id number] Date of Birth: 1946/05/17 Referring Provider (PT): Einar Pheasant MD   Encounter Date: 11/07/2021   PT End of Session - 11/07/21 0914     Visit Number 8    Number of Visits 17    Date for PT Re-Evaluation 12/07/21    Authorization - Visit Number 8    Progress Note Due on Visit 10    PT Start Time 0905    PT Stop Time 0937    PT Time Calculation (min) 32 min    Activity Tolerance Patient tolerated treatment well    Behavior During Therapy [facility] for tasks assessed/performed             Past Medical History:  Diagnosis Date   Diabetes mellitus without complication ([place])    GERD (gastroesophageal reflux disease)    Hypercholesteremia    Hypertension     Past Surgical History:  Procedure Laterality Date   BREAST BIOPSY     TOOTH EXTRACTION     tooth implantation   TUBAL LIGATION  1979    There were no vitals filed for this visit.   Subjective Assessment - 11/07/21 0909     Subjective Patient reports she rode to [place] this weekend to see the parade and did not have back pain. No pain this am either. reports she is feeling good overall. Feels like she can graduate PT from her LBP.    Pertinent History Lisa Robles is a 75 y.o. female presenting to therapy with c/o chronic bilateral LBP R>L. She reports difficulty transfering into her low vehicle, pain with sitting, and walking >20-30 min. Pt also reports having difficulty with bending, lifting, laying on R side, and negotiating stairs.  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 Her goal for therapy is to decrease her LBP in order take her granddaughter to dance (long car ride) and enjoy shopping.   She rates her pain as a 6/10 now and is at it is currently worst. 0/10 pain at best She denies any pain radiating down her RLE. She reports elevating her legs helps to relieve her pain. Pt has a PMH of HTN, DMII, asthma, hypercholesterolemia. Pt denies any unexplained weight fluctuation, saddle paresthesia, loss of bowel/bladder control or unrelenting night pain at this time. Pt does report having one fall in April 2022 in [place] d/t dehydration and alcohol comsumption.    Limitations Sitting;Walking;Lifting;House hold activities    How long can you sit comfortably? 1-2 hours    How long can you stand comfortably? na    How long can you walk comfortably? 20-30    Diagnostic tests x-ray    Patient Stated Goals patient would like to decrease LBP to shopping    Pain Onset More than a month ago              Therex    Nu Step L3 x 5 min Seat 8 SPM >90  for gentle LE strengthening    SLS barefoot L and R 30sec Stair amb reciprocal pattern without handrail ascent and descent supervision for safety  PT reviewed the following HEP with patient with patient able to demonstrate a set of the following with min cuing for correction needed. PT educated patient  on parameters of therex (how/when to inc/decrease intensity, frequency, rep/set range, stretch hold time, and purpose of therex) with verbalized understanding.   Access Code: [email or web address] Standing Hip Abduction with Resistance at Ankles and Counter Support - 1 x daily - 2 x weekly - 3 sets - 6-12 reps Squat with Chair Touch - 1 x daily - 2 x weekly - 3 sets - 6-12 reps Standing Hip Hinge - 1 x daily - 2 x weekly - 3 sets - 6-12 reps Single Leg Stance - 2 x weekly - 3 reps - 30sec hold Seated Piriformis Stretch - 2-3 x daily - 7 x weekly - 30sec hold                           PT Education - 11/07/21 0911     Education Details therex form/technique    Person(s) Educated Patient    Methods  Explanation;Demonstration;Verbal cues    Comprehension Verbalized understanding;Returned demonstration;Verbal cues required              PT Short Term Goals - 11/07/21 0915       PT SHORT TERM GOAL #1   Title Pt will be independent with HEP in order to self-manage LBP.    Baseline 10/12/21: HEP Given 11/07/21    Time 4    Period Weeks    Status Achieved               PT Long Term Goals - 11/07/21 0916       PT LONG TERM GOAL #1   Title Patient will increase FOTO score to 72 to demonstrate predicted increase in functional mobility to complete ADLs    Baseline 10/12/21: 49; 11/07/21 80    Time 8    Period Weeks    Status Achieved      PT LONG TERM GOAL #2   Title Pt will decrease worst pain as reported on NPRS by at least 2 points with ADLS in order to demonstrate clinically significant reduction in pain.    Baseline 10/12/21: 6/10 11/07/21 2/10    Time 8    Period Weeks    Status Achieved      PT LONG TERM GOAL #3   Title Pt will negotiate 4, 6" stairs with a reciprocal gait pattern without railing to demonstrate increase in functional LE strength and good femoral control.    Baseline 10/12/21: reciprocal ascending/step to needed descending; 12.5.22 reciprocal ascent and descent without handrail    Time 8    Period Weeks    Status Achieved      PT LONG TERM GOAL #4   Title Pt will improve single leg stance to 20 sec or greater in order to demonstrate increased LE strength, increased balance, and decrease likelihood of falling    Baseline 10/18/21: L: 28 sec R: 12.3 sec; 11/07/21 L: 30sec R: 30sec    Time 8    Period Weeks    Status Achieved                   Plan - 11/07/21 4970     Clinical Impression Statement PT reassessed goals for discharge, where patient has met all goals to d/c to robust HEP. Patient is able to demonstrate undersatnding of proper technique of all therex, and verbalize understanding of parameters of therex and how/when to  increase/decrease intensity of therex. Patient with reduced pain and is subjectively able to complete all activities she  wishes. Pt to d/c PT at this time.    Personal Factors and Comorbidities Age;Comorbidity 2;Finances    Comorbidities HTN, DMII, obesity    Examination-Activity Limitations Transfers;Sit;Lift;Bend;Locomotion Level;Stairs    Examination-Participation Restrictions Community Activity;Driving;Cleaning;Laundry    Stability/Clinical Decision Making Evolving/Moderate complexity    Clinical Decision Making Moderate    Rehab Potential Good    PT Frequency 2x / week    PT Duration 8 weeks    PT Treatment/Interventions ADLs/Self Care Home Management;Cryotherapy;Moist Heat;Electrical Stimulation;Traction;Gait training;Stair training;Functional mobility training;Therapeutic activities;Neuromuscular re-education;Balance training;Therapeutic exercise;Patient/family education;Manual techniques;Passive range of motion;Joint Manipulations;Dry needling    PT Next Visit Plan Eccentric LE control/hip stability and mobility    PT Home Exercise Plan piriformis stretch, standing hip abduction    Consulted and Agree with Plan of Care Patient             Patient will benefit from skilled therapeutic intervention in order to improve the following deficits and impairments:  Abnormal gait, Decreased balance, Decreased endurance, Decreased activity tolerance, Decreased strength, Decreased range of motion, Hypomobility, Obesity, Pain, Postural dysfunction  Visit Diagnosis: Chronic bilateral low back pain with sciatica, sciatica laterality unspecified     Problem List Patient Active Problem List   Diagnosis Date Noted   Low back pain 09/29/2021   Syncope 06/06/2021   OSA (obstructive sleep apnea) 11/08/2020   Healthcare maintenance 11/08/2020   Fatigue 02/01/2020   Rash 07/06/2019   Chest pain 04/29/2019   Cough 04/29/2019   Asthma 04/29/2019   Frequent urinary tract infections 01/05/2019    UTI (urinary tract infection) 08/22/2017   Health care maintenance 04/20/2015   Abnormal Pap smear of cervix 05/19/2014   Numbness and tingling in left hand 12/21/2013   GERD (gastroesophageal reflux disease) 11/14/2012   Hypertension 11/14/2012   Hypercholesterolemia 11/14/2012   Type 2 diabetes mellitus with hyperglycemia ([place]) 11/14/2012   Anemia 11/14/2012   B12 deficiency 11/14/2012   Durwin Reges DPT Durwin Reges, PT 11/07/2021, 9:53 AM  [place] [facility] [address] [place], [address] Phone: [phone]   Fax:  [phone]  Name: Lisa Robles MRN: [id number] Date of Birth: November 17, 1946

## 2021-11-09 ENCOUNTER — Ambulatory Visit: Payer: Medicare PPO | Admitting: Physical Therapy

## 2021-11-14 ENCOUNTER — Ambulatory Visit
Admission: RE | Admit: 2021-11-14 | Discharge: 2021-11-14 | Disposition: A | Discharge status: Home / Self Care | Payer: Medicare PPO | Source: Ambulatory Visit | Attending: Internal Medicine | Admitting: Internal Medicine

## 2021-11-14 ENCOUNTER — Ambulatory Visit: Payer: Medicare PPO | Admitting: Physical Therapy

## 2021-11-14 DIAGNOSIS — Z1231 Encounter for screening mammogram for malignant neoplasm of breast: Secondary | ICD-10-CM | POA: Diagnosis not present

## 2021-11-16 ENCOUNTER — Encounter: Payer: Medicare PPO | Admitting: Physical Therapy

## 2021-11-21 ENCOUNTER — Encounter: Payer: Medicare PPO | Admitting: Physical Therapy

## 2021-11-23 ENCOUNTER — Encounter: Payer: Medicare PPO | Admitting: Physical Therapy

## 2021-11-25 ENCOUNTER — Emergency Department
Admission: EM | Admit: 2021-11-25 | Discharge: 2021-11-25 | Disposition: A | Discharge status: Home / Self Care | Payer: Medicare PPO | Attending: Emergency Medicine | Admitting: Emergency Medicine

## 2021-11-25 ENCOUNTER — Other Ambulatory Visit: Payer: Self-pay

## 2021-11-25 ENCOUNTER — Encounter: Payer: Self-pay | Admitting: Emergency Medicine

## 2021-11-25 ENCOUNTER — Telehealth: Payer: Self-pay | Admitting: Internal Medicine

## 2021-11-25 ENCOUNTER — Emergency Department: Payer: Medicare PPO

## 2021-11-25 DIAGNOSIS — J45909 Unspecified asthma, uncomplicated: Secondary | ICD-10-CM | POA: Insufficient documentation

## 2021-11-25 DIAGNOSIS — Z87891 Personal history of nicotine dependence: Secondary | ICD-10-CM | POA: Diagnosis not present

## 2021-11-25 DIAGNOSIS — I6782 Cerebral ischemia: Secondary | ICD-10-CM | POA: Diagnosis not present

## 2021-11-25 DIAGNOSIS — R29810 Facial weakness: Secondary | ICD-10-CM | POA: Diagnosis not present

## 2021-11-25 DIAGNOSIS — R0789 Other chest pain: Secondary | ICD-10-CM | POA: Diagnosis not present

## 2021-11-25 DIAGNOSIS — R29818 Other symptoms and signs involving the nervous system: Secondary | ICD-10-CM | POA: Diagnosis not present

## 2021-11-25 DIAGNOSIS — I10 Essential (primary) hypertension: Secondary | ICD-10-CM | POA: Insufficient documentation

## 2021-11-25 DIAGNOSIS — R202 Paresthesia of skin: Secondary | ICD-10-CM | POA: Diagnosis not present

## 2021-11-25 DIAGNOSIS — R079 Chest pain, unspecified: Secondary | ICD-10-CM | POA: Diagnosis not present

## 2021-11-25 DIAGNOSIS — E119 Type 2 diabetes mellitus without complications: Secondary | ICD-10-CM | POA: Diagnosis not present

## 2021-11-25 DIAGNOSIS — G510 Bell's palsy: Secondary | ICD-10-CM | POA: Diagnosis not present

## 2021-11-25 DIAGNOSIS — Z79899 Other long term (current) drug therapy: Secondary | ICD-10-CM | POA: Diagnosis not present

## 2021-11-25 DIAGNOSIS — R200 Anesthesia of skin: Secondary | ICD-10-CM | POA: Diagnosis not present

## 2021-11-25 LAB — COMPREHENSIVE METABOLIC PANEL
ALT: 15 U/L (ref 0–44)
AST: 20 U/L (ref 15–41)
Albumin: 4.3 g/dL (ref 3.5–5.0)
Alkaline Phosphatase: 83 U/L (ref 38–126)
Anion gap: 5 (ref 5–15)
BUN: 11 mg/dL (ref 8–23)
CO2: 30 mmol/L (ref 22–32)
Calcium: 10.2 mg/dL (ref 8.9–10.3)
Chloride: 101 mmol/L (ref 98–111)
Creatinine, Ser: 0.79 mg/dL (ref 0.44–1.00)
GFR, Estimated: >60 mL/min (ref >60)
Glucose, Bld: 144 mg/dL — ABNORMAL HIGH (ref 70–99)
Potassium: 3.8 mmol/L (ref 3.5–5.1)
Sodium: 136 mmol/L (ref 135–145)
Total Bilirubin: 0.6 mg/dL (ref 0.3–1.2)
Total Protein: 8.3 g/dL — ABNORMAL HIGH (ref 6.5–8.1)

## 2021-11-25 LAB — CBC
HCT: 43.6 % (ref 36.0–46.0)
Hemoglobin: 13.9 g/dL (ref 12.0–15.0)
MCH: 30.2 pg (ref 26.0–34.0)
MCHC: 31.9 g/dL (ref 30.0–36.0)
MCV: 94.6 fL (ref 80.0–100.0)
Platelets: 322 10*3/uL (ref 150–400)
RBC: 4.61 MIL/uL (ref 3.87–5.11)
RDW: 13.3 % (ref 11.5–15.5)
WBC: 7.1 10*3/uL (ref 4.0–10.5)
nRBC: 0 % (ref 0.0–0.2)

## 2021-11-25 LAB — DIFFERENTIAL
Abs Immature Granulocytes: 0.01 10*3/uL (ref 0.00–0.07)
Basophils Absolute: 0 10*3/uL (ref 0.0–0.1)
Basophils Relative: 1 %
Eosinophils Absolute: 0.1 10*3/uL (ref 0.0–0.5)
Eosinophils Relative: 1 %
Immature Granulocytes: 0 %
Lymphocytes Relative: 45 %
Lymphs Abs: 3.2 10*3/uL (ref 0.7–4.0)
Monocytes Absolute: 0.6 10*3/uL (ref 0.1–1.0)
Monocytes Relative: 8 %
Neutro Abs: 3.2 10*3/uL (ref 1.7–7.7)
Neutrophils Relative %: 45 %

## 2021-11-25 LAB — APTT: aPTT: 28 seconds (ref 24–36)

## 2021-11-25 LAB — PROTIME-INR
INR: 0.9 (ref 0.8–1.2)
Prothrombin Time: 12.1 seconds (ref 11.4–15.2)

## 2021-11-25 MED ORDER — VALACYCLOVIR HCL 500 MG PO TABS: 1000.0000 mg | ORAL_TABLET | Freq: Three times a day (TID) | ORAL | 0 refills | Status: DC

## 2021-11-25 MED ORDER — PREDNISONE 20 MG PO TABS: 60.0000 mg | ORAL_TABLET | Freq: Every day | ORAL | 0 refills | Status: DC

## 2021-11-25 MED ORDER — ERYTHROMYCIN 5 MG/GM OP OINT: 1.0000 "application " | TOPICAL_OINTMENT | Freq: Every day | OPHTHALMIC | 1 refills | Status: DC

## 2021-11-25 NOTE — Telephone Encounter (Signed)
Pts daughter Ander Slade called in regards to pt. Pt was seen today [facility] with the Urgent Care. Pt was being seen for possible bells palsy because pt has history of this. Provider of VV advised pt and pt daughter to go to ED and believes she had a mini stroke. Pts daughter was wanting to know if she should take her mom or exactly what she should do since pt feels fine. Pts daughter was advised to take mother to ED. Pts daughter is taking her there now.

## 2021-11-25 NOTE — ED Triage Notes (Signed)
Pt comes into the ED via POV c/o right side facial droop.  Pt woke up with the symptoms this morning.  Pt has a h/o bells palsy so patient completed a virtual appt this morning but they wanted her to come here and r/o stroke.  Pt denies any unilateral weakness, blurred vision, or change in speech.  Pt in NAD at this time.

## 2021-11-25 NOTE — ED Triage Notes (Signed)
Pt sent over via her virtual MD. Pt with visit this am for facial drooping. Pt reports assumed it was bells palsy due to hx of the same but was told by virtual MD that with her age it was probably a stroke

## 2021-11-25 NOTE — ED Notes (Signed)
See triage note. Pt has hx bells palsy with new onset R facial droop since this morning. EDP at bedside explaining findings and plan of care to pt. Pt does not have weakness or numbness on either side.

## 2021-11-25 NOTE — ED Provider Notes (Signed)
[facility] Emergency Department Provider Note   ____________________________________________   Event Date/Time   First MD Initiated Contact with Patient 11/25/21 1057     (approximate)  I have reviewed the triage vital signs and the nursing notes.   HISTORY  Chief Complaint Facial Droop    HPI Lisa Robles is a 75 y.o. female with past medical history of hypertension, hyperlipidemia, diabetes, asthma, and GERD who presents to the ED complaining of facial droop.  Patient reports that she woke up this morning noticing that the right side of her face was drooping compared to the left.  She has noticed some clear drainage from her eyes and some burning discomfort to her eyes, but denies any vision changes.  She also denies any speech changes, numbness, or weakness.  She states that she was feeling fine when she went to bed last night.  She had a telehealth visit with urgent care and was advised to come to the ED for further evaluation for possible stroke.  Patient reports a history of Bell's palsy in the past about 10 years ago.        Past Medical History:  Diagnosis Date   Diabetes mellitus without complication (HCC)    GERD (gastroesophageal reflux disease)    Hypercholesteremia    Hypertension     Patient Active Problem List   Diagnosis Date Noted   Low back pain 09/29/2021   Syncope 06/06/2021   OSA (obstructive sleep apnea) 11/08/2020   Healthcare maintenance 11/08/2020   Fatigue 02/01/2020   Rash 07/06/2019   Chest pain 04/29/2019   Cough 04/29/2019   Asthma 04/29/2019   Frequent urinary tract infections 01/05/2019   UTI (urinary tract infection) 08/22/2017   Health care maintenance 04/20/2015   Abnormal Pap smear of cervix 05/19/2014   Numbness and tingling in left hand 12/21/2013   GERD (gastroesophageal reflux disease) 11/14/2012   Hypertension 11/14/2012   Hypercholesterolemia 11/14/2012   Type 2 diabetes mellitus with  hyperglycemia (HCC) 11/14/2012   Anemia 11/14/2012   B12 deficiency 11/14/2012    Past Surgical History:  Procedure Laterality Date   BREAST BIOPSY     TOOTH EXTRACTION     tooth implantation   TUBAL LIGATION  1979    Prior to Admission medications   Medication Sig Start Date End Date Taking? Authorizing Provider  erythromycin ophthalmic ointment Place 1 application into the right eye at bedtime for 10 days. 11/25/21 12/05/21 Yes Concha Se, MD  predniSONE (DELTASONE) 20 MG tablet Take 3 tablets (60 mg total) by mouth daily with breakfast for 7 days. 11/25/21 12/02/21 Yes Concha Se, MD  valACYclovir (VALTREX) 500 MG tablet Take 2 tablets (1,000 mg total) by mouth 3 (three) times daily for 7 days. 11/25/21 12/02/21 Yes Concha Se, MD  albuterol (VENTOLIN HFA) 108 (90 Base) MCG/ACT inhaler Inhale 2 puffs into the lungs every 6 (six) hours as needed. 09/13/20   Sherlene Shams, MD  azelastine (ASTELIN) 0.1 % nasal spray 1 spray in the morning and at bedtime. 09/10/20   [provider]  docusate sodium (COLACE) 100 MG capsule Take 1 capsule (100 mg total) by mouth every 12 (twelve) hours. 03/01/20   Wallis Bamberg, PA-C  fluticasone (FLONASE) 50 MCG/ACT nasal spray Place 2 sprays into both nostrils daily. 04/29/19   Dale [place], MD  guaiFENesin (MUCINEX) 600 MG 12 hr tablet Take 2 tablets (1,200 mg total) by mouth 2 (two) times daily as needed for cough or  to loosen phlegm. 04/08/19   Tracey Harries, FNP  hydrochlorothiazide (HYDRODIURIL) 25 MG tablet TAKE 1 TABLET(25 MG) BY MOUTH DAILY 06/07/21   Dale Kendall, MD  meclizine (ANTIVERT) 25 MG tablet Take 1 tablet (25 mg total) by mouth 3 (three) times daily as needed for dizziness. 12/02/18   Tommie Sams, DO  nystatin cream (MYCOSTATIN) Apply 1 application topically 2 (two) times daily. 07/03/19   Dale Burnett, MD  pantoprazole (PROTONIX) 40 MG tablet TAKE 1 TABLET(40 MG) BY MOUTH DAILY 02/26/20   Dale [place], MD   pravastatin (PRAVACHOL) 40 MG tablet TAKE 1 TABLET EVERY DAY 10/17/21   Dale [place], MD    Allergies Cinnamon, Bactrim [sulfamethoxazole-trimethoprim], and Shellfish allergy  Family History  Problem Relation Age of Onset   Heart attack Mother    Heart attack Father    Hypercholesterolemia Brother    Diabetes Brother    Cancer Maternal Aunt        breast   Diabetes Maternal Grandmother    Colon cancer Neg Hx    Breast cancer Neg Hx     Social History Social History   Tobacco Use   Smoking status: Former    Packs/day: 1.00    Years: 7.00    Pack years: 7.00    Types: Cigarettes    Quit date: 12/04/1977    Years since quitting: 44.0   Smokeless tobacco: Never  Vaping Use   Vaping Use: Never used  Substance Use Topics   Alcohol use: No    Alcohol/week: 0.0 standard drinks   Drug use: No    Review of Systems  Constitutional: No fever/chills Eyes: No visual changes.  Positive for eye drainage. ENT: No sore throat. Cardiovascular: Denies chest pain. Respiratory: Denies shortness of breath. Gastrointestinal: No abdominal pain.  No nausea, no vomiting.  No diarrhea.  No constipation. Genitourinary: Negative for dysuria. Musculoskeletal: Negative for back pain. Skin: Negative for rash. Neurological: Negative for headaches, focal weakness or numbness.  Positive for facial droop.  ____________________________________________   PHYSICAL EXAM:  VITAL SIGNS: ED Triage Vitals [11/25/21 0929]  Enc Vitals Group     BP (!) 148/77     Pulse Rate 79     Resp 17     Temp 97.9 F (36.6 C)     Temp Source Oral     SpO2 97 %     Weight 186 lb 1.1 oz (84.4 kg)     Height 5\' 3"  (1.6 m)     Head Circumference      Peak Flow      Pain Score 0     Pain Loc      Pain Edu?      Excl. in GC?     Constitutional: Alert and oriented. Eyes: Conjunctivae are normal. Head: Atraumatic. Nose: No congestion/rhinnorhea. Mouth/Throat: Mucous membranes are moist. Neck: Normal  ROM Cardiovascular: Normal rate, regular rhythm. Grossly normal heart sounds.  2+ radial pulses bilaterally. Respiratory: Normal respiratory effort.  No retractions. Lungs CTAB. Gastrointestinal: Soft and nontender. No distention. Genitourinary: deferred Musculoskeletal: No lower extremity tenderness nor edema. Neurologic:  Normal speech and language.  Right-sided facial droop noted with forehead involvement.  5 out of 5 strength in bilateral upper and lower extremities with no pronator drift. Skin:  Skin is warm, dry and intact. No rash noted. Psychiatric: Mood and affect are normal. Speech and behavior are normal.  ____________________________________________   LABS (all labs ordered are listed, but only abnormal results are displayed)  Labs Reviewed  COMPREHENSIVE METABOLIC PANEL - Abnormal; Notable for the following components:      Result Value   Glucose, Bld 144 (*)    Total Protein 8.3 (*)    All other components within normal limits  PROTIME-INR  APTT  CBC  DIFFERENTIAL   ____________________________________________  EKG  ED ECG REPORT I, Chesley Noon, the attending physician, personally viewed and interpreted this ECG.   Date: 11/25/2021  EKG Time: 9:35  Rate: 76  Rhythm: normal sinus rhythm  Axis: LAD  Intervals:none  ST&T Change: None   PROCEDURES  Procedure(s) performed (including Critical Care):  Procedures   ____________________________________________   INITIAL IMPRESSION / ASSESSMENT AND PLAN / ED COURSE      75 year old female with past medical history of hypertension, hyperlipidemia, diabetes, asthma, and GERD who presents to the ED with facial droop noted upon waking up this morning along with clear drainage from both eyes.  Patient appears to have a right-sided facial droop that involves her forehead with no neurologic deficits in her extremities, most consistent with Bell's palsy.  CT head is negative for acute process and labs are  unremarkable.  Given her advanced age and risk factors, we will check MRI brain to rule out stroke, but if this is negative then patient would be appropriate for outpatient management of Bell's palsy.      ____________________________________________   FINAL CLINICAL IMPRESSION(S) / ED DIAGNOSES  Final diagnoses:  Bell's palsy     ED Discharge Orders          Ordered    predniSONE (DELTASONE) 20 MG tablet  Daily with breakfast        11/25/21 1433    valACYclovir (VALTREX) 500 MG tablet  3 times daily        11/25/21 1433    erythromycin ophthalmic ointment  Daily at bedtime        11/25/21 1433             Note:  This document was prepared using Dragon voice recognition software and may include unintentional dictation errors.    Chesley Noon, MD 11/25/21 [phone]

## 2021-11-25 NOTE — ED Provider Notes (Signed)
MRI is negative.  Reevaluated patient she is evidence of Bell's palsy on exam.  Will treat with steroids, valacyclovir.  Recommended artificial tears every hour while awake and ophthalmic ointment at night and keeping the eye shut at night and f/u ophtho any issues.    Concha Se, MD 11/25/21 [id number]

## 2021-11-25 NOTE — Telephone Encounter (Signed)
Patient has arrived at ED.

## 2021-11-25 NOTE — Discharge Instructions (Signed)
°  Artificial tears every hour while patient is awake (get at the local pharmacy)  Ophthalmic ointment at night (prescribed)  Eye should be taped shut at night Steroids- start today  Valacyclovir 1000mg  TID x1 week   Follow-up with eye doctor if any eye issues and return to the ER if develop worsening symptoms, fevers or any other concerns otherwise follow-up with your primary doctor

## 2021-11-26 ENCOUNTER — Telehealth: Payer: Self-pay | Admitting: Emergency Medicine

## 2021-11-26 MED ORDER — VALACYCLOVIR HCL 500 MG PO TABS: 1000.0000 mg | ORAL_TABLET | Freq: Three times a day (TID) | ORAL | 0 refills | Status: AC

## 2021-11-26 MED ORDER — PREDNISONE 20 MG PO TABS: 60.0000 mg | ORAL_TABLET | Freq: Every day | ORAL | 0 refills | Status: AC

## 2021-11-26 MED ORDER — ERYTHROMYCIN 5 MG/GM OP OINT: 1.0000 "application " | TOPICAL_OINTMENT | Freq: Every day | OPHTHALMIC | 1 refills | Status: AC

## 2021-11-26 NOTE — Telephone Encounter (Signed)
Power outage at [employer]

## 2021-12-06 ENCOUNTER — Ambulatory Visit (INDEPENDENT_AMBULATORY_CARE_PROVIDER_SITE_OTHER): Payer: Medicare PPO

## 2021-12-06 ENCOUNTER — Other Ambulatory Visit: Payer: Self-pay

## 2021-12-06 DIAGNOSIS — E538 Deficiency of other specified B group vitamins: Secondary | ICD-10-CM

## 2021-12-06 MED ORDER — CYANOCOBALAMIN 1000 MCG/ML IJ SOLN
1000.0000 ug | Freq: Once | INTRAMUSCULAR | Status: AC
  Administered 2021-12-06: 1000 ug via INTRAMUSCULAR

## 2021-12-06 NOTE — Progress Notes (Signed)
Patient in for B12 injection. Administered in right deltoid. Tolerated well. No complaints or concerns at this time.

## 2021-12-28 ENCOUNTER — Other Ambulatory Visit: Payer: Self-pay | Admitting: Internal Medicine

## 2022-01-09 ENCOUNTER — Other Ambulatory Visit: Payer: Self-pay

## 2022-01-09 ENCOUNTER — Ambulatory Visit (INDEPENDENT_AMBULATORY_CARE_PROVIDER_SITE_OTHER): Payer: Medicare PPO | Admitting: *Deleted

## 2022-01-09 DIAGNOSIS — E538 Deficiency of other specified B group vitamins: Secondary | ICD-10-CM

## 2022-01-09 IMAGING — MR MR HEAD W/O CM
12 series · 45 of 48 positions shown · non-contrast
Comparison: None.

CLINICAL DATA: Neuro deficit, acute, stroke suspected

EXAM:
MRI HEAD WITHOUT CONTRAST
TECHNIQUE: Multiplanar, multiecho pulse sequences of the brain and surrounding
structures were obtained without intravenous contrast.

[Series 5: ax dwi_tracew · axial · 3.0mm · 0.65mm/px · z∈[-131,+23]mm · 3 of 48 slices shown]
[im 1/48]
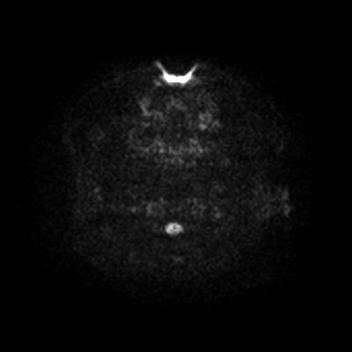
[im 24/48]
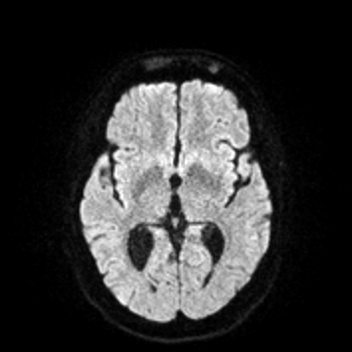
[im 48/48]
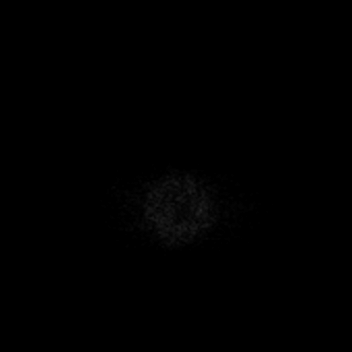

[Series 6: ax dwi_adc · axial · 3.0mm · 0.65mm/px · z∈[-131,+13]mm · 3 of 44 slices shown]
[im 1/44]
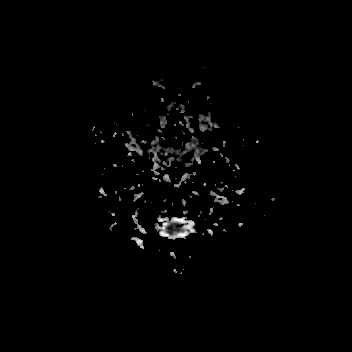
[im 22/44]
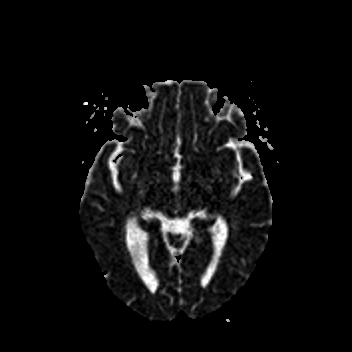
[im 44/44]
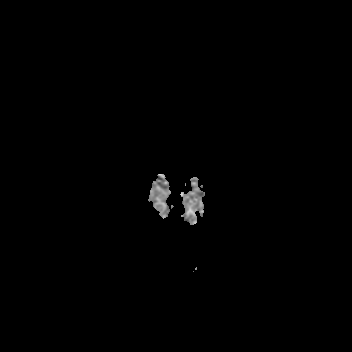

[Series 7: cor dwi_tracew · coronal · 5.0mm · 1.80mm/px · 3 of 38 slices shown]
[im 1/38]
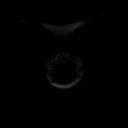
[im 19/38]
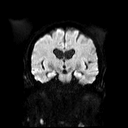
[im 38/38]
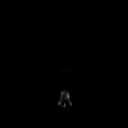

[Series 8: cor dwi_adc · coronal · 5.0mm · 1.80mm/px · 3 of 38 slices shown]
[im 1/38]
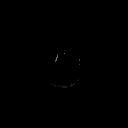
[im 19/38]
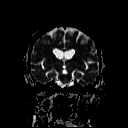
[im 38/38]
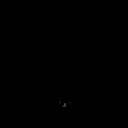

[Series 9: T1 · sagittal · 5.0mm · 0.62mm/px · 2 of 21 slices shown (1 of 2)]
[im 1/21]
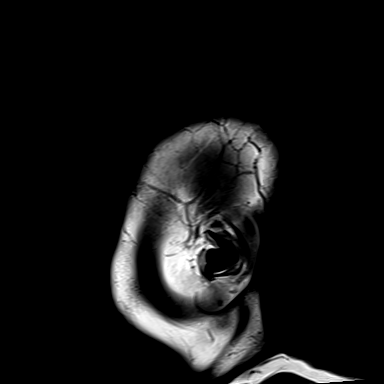
[im 21/21]
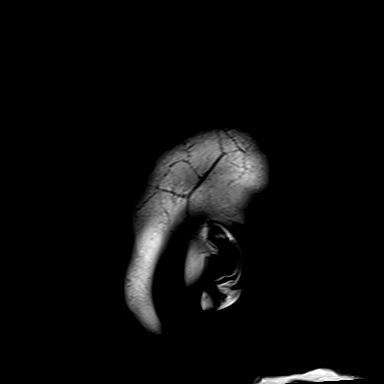

[Series 10: T2 · axial · 5.0mm · 0.86mm/px · z∈[-125,+18]mm · 2 of 25 slices shown (1 of 2)]
[im 1/25]
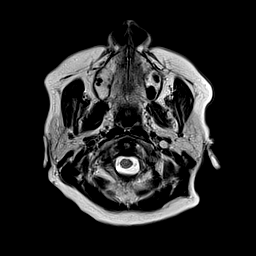
[im 25/25]
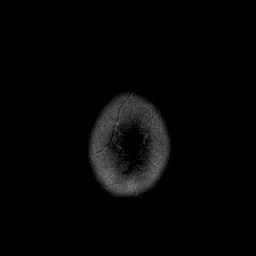

[Series 12: pha_images · axial · 3.0mm · 0.90mm/px · z∈[-129,+23]mm · 4 of 52 slices shown]
[im 1/52]
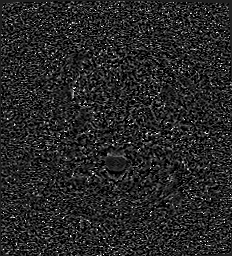
[im 18/52]
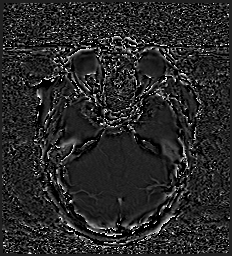
[im 35/52]
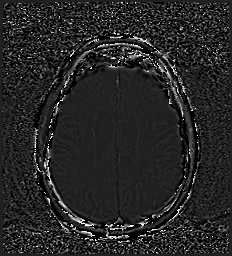
[im 52/52]
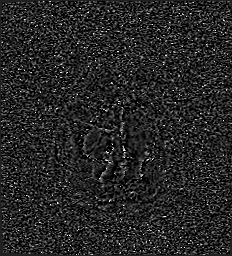

[Series 13: swi_images · axial · 3.0mm · 0.90mm/px · z∈[-129,+23]mm · 4 of 52 slices shown]
[im 1/52]
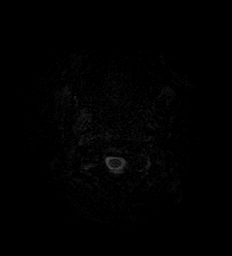
[im 18/52]
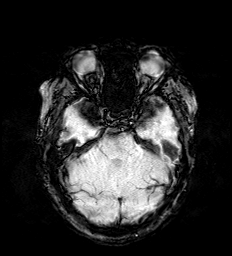
[im 35/52]
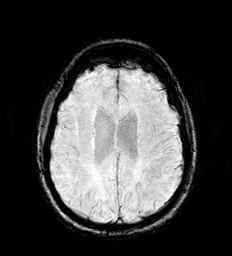
[im 52/52]
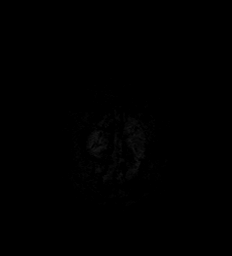

[Series 15: FLAIR · axial · 3.0mm · 0.69mm/px · z∈[-134,+27]mm · 4 of 55 slices shown]
[im 1/55]
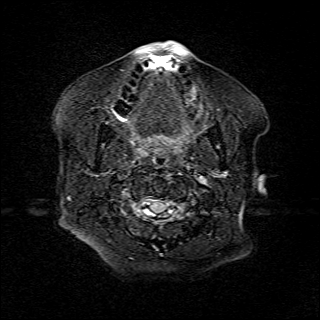
[im 19/55]
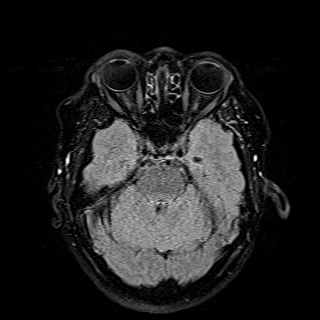
[im 37/55]
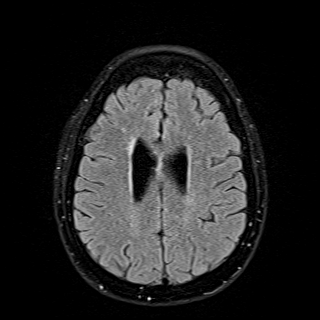
[im 55/55]
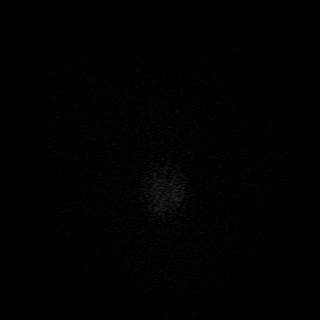

[Series 16: T1 · axial · 1.0mm · 0.98mm/px · z∈[-136,+38]mm · 10 of 176 slices shown (2 of 2)]
[im 1/176]
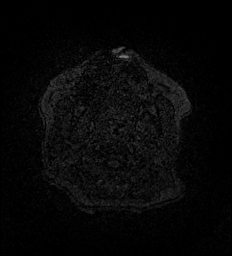
[im 15/176]
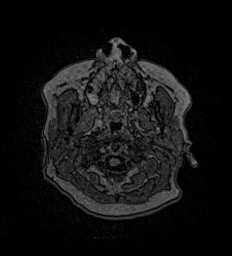
[im 30/176]
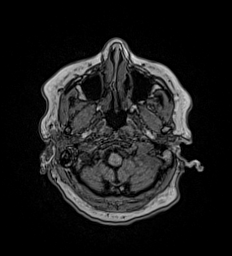
[im 44/176]
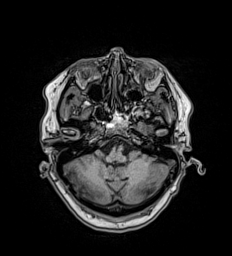
[im 59/176]
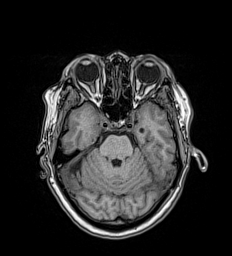
[im 73/176]
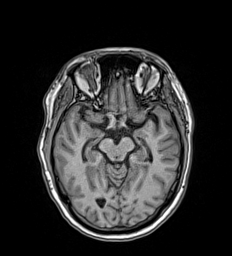
[im 103/176]
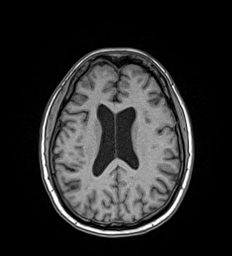
[im 117/176]
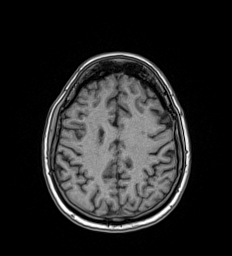
[im 146/176]
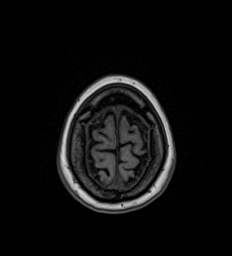
[im 176/176]
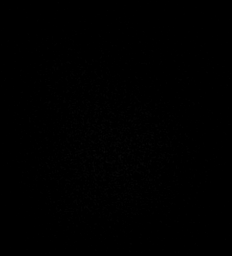

[Series 17: T2 · coronal · 5.0mm · 0.86mm/px · 2 of 29 slices shown (2 of 2)]
[im 1/29]
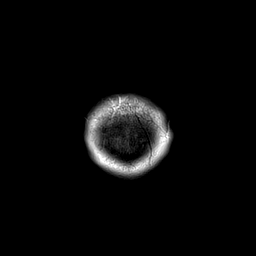
[im 29/29]
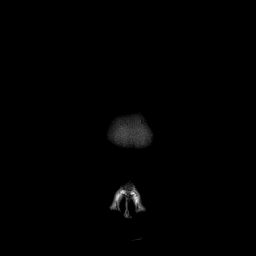

[Series 18: iac thins · axial · 0.6mm · 0.30mm/px · z∈[-130,-95]mm · 5 of 60 slices shown]
[im 1/60]
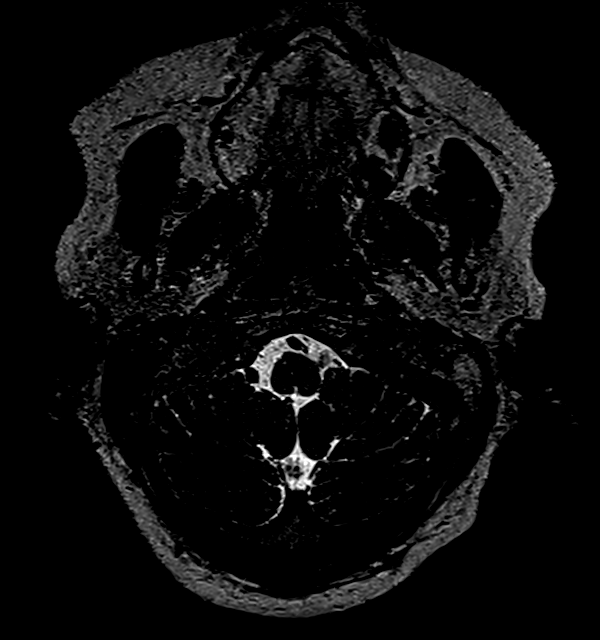
[im 15/60]
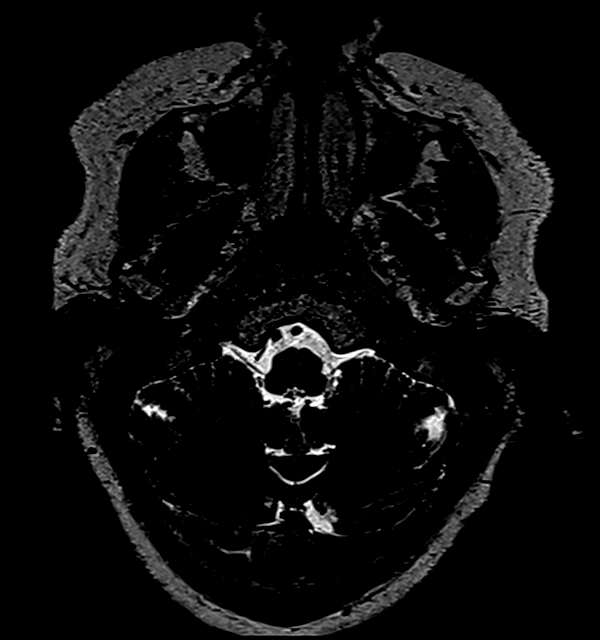
[im 30/60]
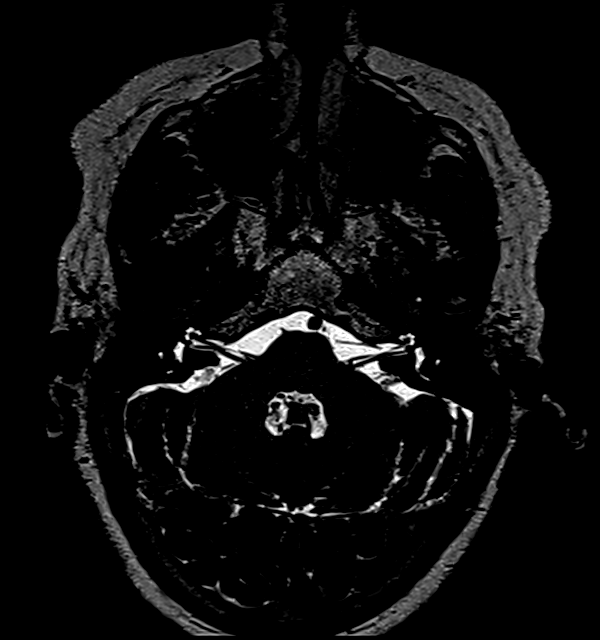
[im 45/60]
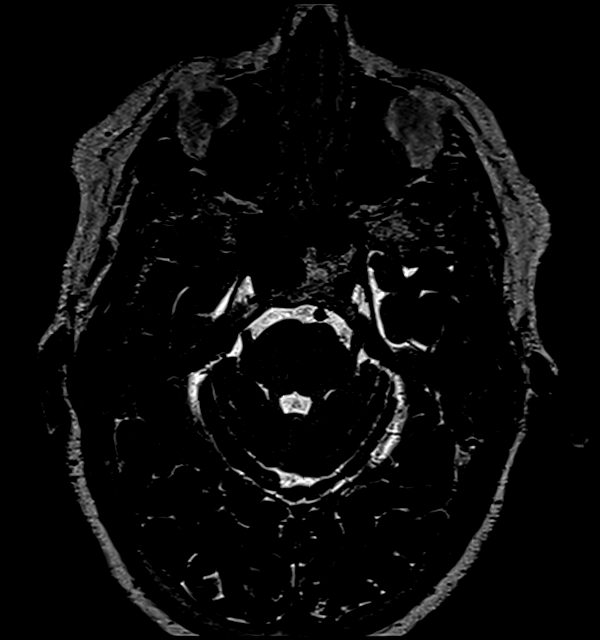
[im 60/60]
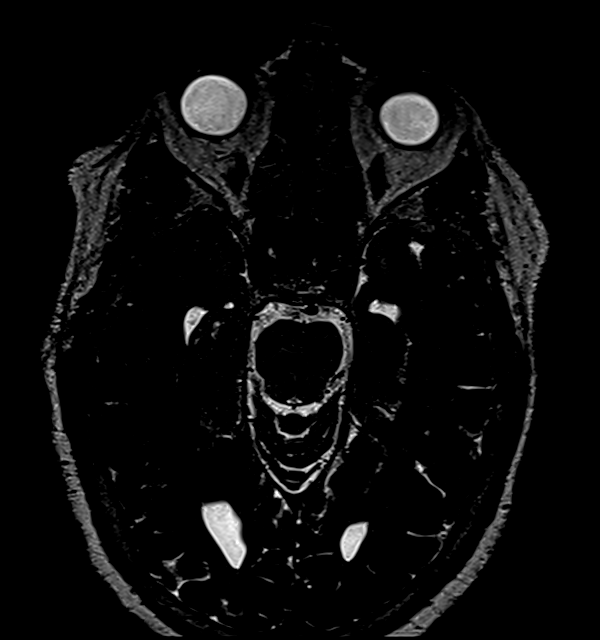

[45 of 48 positions shown; findings below may reference images not displayed]

FINDINGS: Brain: There is no acute infarction or intracranial hemorrhage.
There is no intracranial mass, mass effect, or edema. There is no
hydrocephalus or extra-axial fluid collection. Ventricles and sulci
are within normal limits in size and configuration. Minimal patchy
foci of T2 hyperintensity in the supratentorial white matter are
nonspecific but may reflect minor chronic microvascular ischemic
changes.

Vascular: Major vessel flow voids at the skull base are preserved.

Skull and upper cervical spine: Normal marrow signal is preserved.

Sinuses/Orbits: Paranasal sinuses are aerated. Orbits are
unremarkable.

Other: Sella is partially empty.  Mastoid air cells are clear.
IMPRESSION: No evidence of recent infarction, hemorrhage, or mass. Minor chronic
microvascular ischemic changes.

## 2022-01-09 MED ORDER — CYANOCOBALAMIN 1000 MCG/ML IJ SOLN
1000.0000 ug | Freq: Once | INTRAMUSCULAR | Status: AC
  Administered 2022-01-09: 1000 ug via INTRAMUSCULAR

## 2022-01-09 NOTE — Progress Notes (Signed)
Patient received a B12 injection in left deltoid & tolerated it well.

## 2022-01-31 ENCOUNTER — Other Ambulatory Visit: Payer: Self-pay

## 2022-01-31 ENCOUNTER — Other Ambulatory Visit (INDEPENDENT_AMBULATORY_CARE_PROVIDER_SITE_OTHER): Payer: Medicare PPO

## 2022-01-31 DIAGNOSIS — E7800 Pure hypercholesterolemia, unspecified: Secondary | ICD-10-CM

## 2022-01-31 DIAGNOSIS — E1165 Type 2 diabetes mellitus with hyperglycemia: Secondary | ICD-10-CM

## 2022-01-31 LAB — HEMOGLOBIN A1C: Hgb A1c MFr Bld: 6.2 % (ref 4.6–6.5)

## 2022-01-31 LAB — BASIC METABOLIC PANEL
BUN: 13 mg/dL (ref 6–23)
CO2: 29 mEq/L (ref 19–32)
Calcium: 10.1 mg/dL (ref 8.4–10.5)
Chloride: 101 mEq/L (ref 96–112)
Creatinine, Ser: 0.83 mg/dL (ref 0.40–1.20)
GFR: 67.87 mL/min (ref >60.00)
Glucose, Bld: 118 mg/dL — ABNORMAL HIGH (ref 70–99)
Potassium: 3.8 mEq/L (ref 3.5–5.1)
Sodium: 137 mEq/L (ref 135–145)

## 2022-01-31 LAB — LIPID PANEL
Cholesterol: 199 mg/dL (ref 0–200)
HDL: 53.7 mg/dL (ref >39.00)
LDL Cholesterol: 120 mg/dL — ABNORMAL HIGH (ref 0–99)
NonHDL: 144.99
Total CHOL/HDL Ratio: 4
Triglycerides: 125 mg/dL (ref 0.0–149.0)
VLDL: 25 mg/dL (ref 0.0–40.0)

## 2022-01-31 LAB — HEPATIC FUNCTION PANEL
ALT: 14 U/L (ref 0–35)
AST: 19 U/L (ref 0–37)
Albumin: 4.1 g/dL (ref 3.5–5.2)
Alkaline Phosphatase: 72 U/L (ref 39–117)
Bilirubin, Direct: 0.1 mg/dL (ref 0.0–0.3)
Total Bilirubin: 0.4 mg/dL (ref 0.2–1.2)
Total Protein: 7.2 g/dL (ref 6.0–8.3)

## 2022-02-02 ENCOUNTER — Ambulatory Visit: Payer: Medicare PPO | Admitting: Internal Medicine

## 2022-02-02 ENCOUNTER — Other Ambulatory Visit: Payer: Self-pay

## 2022-02-02 ENCOUNTER — Ambulatory Visit
Admission: RE | Admit: 2022-02-02 | Discharge: 2022-02-02 | Disposition: A | Discharge status: Home / Self Care | Payer: Medicare PPO | Source: Ambulatory Visit | Attending: Internal Medicine | Admitting: Internal Medicine

## 2022-02-02 ENCOUNTER — Other Ambulatory Visit: Payer: Self-pay | Admitting: Internal Medicine

## 2022-02-02 ENCOUNTER — Encounter: Payer: Self-pay | Admitting: Internal Medicine

## 2022-02-02 VITALS — BP 122/74 | HR 75 | Temp 98.2°F | Resp 14 | Ht 63.0 in | Wt 184.4 lb

## 2022-02-02 DIAGNOSIS — R609 Edema, unspecified: Secondary | ICD-10-CM

## 2022-02-02 DIAGNOSIS — I10 Essential (primary) hypertension: Secondary | ICD-10-CM

## 2022-02-02 DIAGNOSIS — J4520 Mild intermittent asthma, uncomplicated: Secondary | ICD-10-CM

## 2022-02-02 DIAGNOSIS — E7800 Pure hypercholesterolemia, unspecified: Secondary | ICD-10-CM | POA: Diagnosis not present

## 2022-02-02 DIAGNOSIS — K219 Gastro-esophageal reflux disease without esophagitis: Secondary | ICD-10-CM | POA: Diagnosis not present

## 2022-02-02 DIAGNOSIS — G4733 Obstructive sleep apnea (adult) (pediatric): Secondary | ICD-10-CM

## 2022-02-02 DIAGNOSIS — E538 Deficiency of other specified B group vitamins: Secondary | ICD-10-CM

## 2022-02-02 DIAGNOSIS — M7989 Other specified soft tissue disorders: Secondary | ICD-10-CM | POA: Diagnosis not present

## 2022-02-02 DIAGNOSIS — E1165 Type 2 diabetes mellitus with hyperglycemia: Secondary | ICD-10-CM | POA: Diagnosis not present

## 2022-02-02 DIAGNOSIS — M545 Low back pain, unspecified: Secondary | ICD-10-CM

## 2022-02-02 LAB — MICROALBUMIN / CREATININE URINE RATIO
Creatinine,U: 144 mg/dL
Microalb Creat Ratio: 0.6 mg/g (ref 0.0–30.0)
Microalb, Ur: 0.9 mg/dL (ref 0.0–1.9)

## 2022-02-02 MED ORDER — PRAVASTATIN SODIUM 80 MG PO TABS: 80.0000 mg | ORAL_TABLET | Freq: Every day | ORAL | 1 refills | Status: DC

## 2022-02-02 NOTE — Progress Notes (Signed)
Patient ID: Glyn Zendejas, female   DOB: 1946/02/06, 76 y.o.   MRN: [id number]   Subjective:    Patient ID: Rubie Maid, female    DOB: [birth year], 76 y.o.   MRN: [id number]  This visit occurred during the SARS-CoV-2 public health emergency.  Safety protocols were in place, including screening questions prior to the visit, additional usage of staff PPE, and extensive cleaning of exam room while observing appropriate contact time as indicated for disinfecting solutions.   Patient here for a scheduled follow up.   Chief Complaint  Patient presents with   Follow-up    F/u - pt complaining of constipation issues. Swelling in R hand, and both feet.   Marland Kitchen   HPI Was seen in ER 11/25/21 - right facial drooping.  CT head negative.  MRI brain - No evidence of recent infarction, hemorrhage, or mass. 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  Is doing better.  Swallowing ok.  No headache or dizziness.  No chest pain or sob reported.  No abdominal pain.  Developed some constipation.  Taking two teaspoon metamucil and align.  Better.  Has been to PT for low back pain.  Does report noticing some feet and ankle swelling.  Also left hand swelling.  Sitting more.  Lower extremities - worse in evening.    Past Medical History:  Diagnosis Date   Diabetes mellitus without complication (HCC)    GERD (gastroesophageal reflux disease)    Hypercholesteremia    Hypertension    Past Surgical History:  Procedure Laterality Date   BREAST BIOPSY     TOOTH EXTRACTION     tooth implantation   TUBAL LIGATION  1979   Family History  Problem Relation Age of Onset   Heart attack Mother    Heart attack Father    Hypercholesterolemia Brother    Diabetes Brother    Cancer Maternal Aunt        breast   Diabetes Maternal Grandmother    Colon cancer Neg Hx    Breast cancer Neg Hx    Social History   Socioeconomic History   Marital status: Widowed    Spouse name: Not on file   Number  of children: 2   Years of education: masters   Highest education level: Not on file  Occupational History   Occupation: retired Pharmacist, hospital  Tobacco Use   Smoking status: Former    Packs/day: 1.00    Years: 7.00    Pack years: 7.00    Types: Cigarettes    Quit date: 12/04/1977    Years since quitting: 44.2   Smokeless tobacco: Never  Vaping Use   Vaping Use: Never used  Substance and Sexual Activity   Alcohol use: No    Alcohol/week: 0.0 standard drinks   Drug use: No   Sexual activity: Yes    Birth control/protection: None    Comment: Is sexually active wih same partner x 14 years. Recently found out partner has been cheating.  Other Topics Concern   Not on file  Social History Narrative   Not on file   Social Determinants of Health   Financial Resource Strain: Low Risk    Difficulty of Paying Living Expenses: Not hard at all  Food Insecurity: No Food Insecurity   Worried About Charity fundraiser in the Last Year: Never true   [place] in the Last Year: Never true  Transportation Needs: No Transportation Needs   Lack of  Transportation (Medical): No   Lack of Transportation (Non-Medical): No  Physical Activity: Insufficiently Active   Days of Exercise per Week: 7 days   Minutes of Exercise per Session: 20 min  Stress: No Stress Concern Present   Feeling of Stress : Not at all  Social Connections: Unknown   Frequency of Communication with Friends and Family: More than three times a week   Frequency of Social Gatherings with Friends and Family: More than three times a week   Attends Religious Services: Not on file   Active Member of Clubs or Organizations: Yes   Attends Archivist Meetings: Not on file   Marital Status: Widowed    Review of Systems  Constitutional:  Negative for appetite change and unexpected weight change.  HENT:  Negative for congestion and sinus pressure.   Respiratory:  Negative for cough, chest tightness and shortness of breath.    Cardiovascular:  Negative for chest pain and palpitations.       Pedal and ankle swelling.    Gastrointestinal:  Negative for abdominal pain, diarrhea, nausea and vomiting.  Genitourinary:  Negative for difficulty urinating and dysuria.  Musculoskeletal:  Negative for joint swelling and myalgias.  Skin:  Negative for color change and rash.  Neurological:  Negative for dizziness, light-headedness and headaches.  Psychiatric/Behavioral:  Negative for agitation and dysphoric mood.       Objective:     BP 122/74 (BP Location: Left Arm, Patient Position: Sitting, Cuff Size: Small)    Pulse 75    Temp 98.2 F (36.8 C) (Oral)    Resp 14    Ht '5\' 3"'  (1.6 m)    Wt 184 lb 6.4 oz (83.6 kg)    SpO2 95%    BMI 32.66 kg/m  Wt Readings from Last 3 Encounters:  02/02/22 184 lb 6.4 oz (83.6 kg)  11/25/21 186 lb 1.1 oz (84.4 kg)  09/29/21 186 lb (84.4 kg)    Physical Exam Vitals reviewed.  Constitutional:      General: She is not in acute distress.    Appearance: Normal appearance.  HENT:     Head: Normocephalic and atraumatic.     Right Ear: External ear normal.     Left Ear: External ear normal.  Eyes:     General: No scleral icterus.       Right eye: No discharge.        Left eye: No discharge.     Conjunctiva/sclera: Conjunctivae normal.  Neck:     Thyroid: No thyromegaly.  Cardiovascular:     Rate and Rhythm: Normal rate and regular rhythm.  Pulmonary:     Effort: No respiratory distress.     Breath sounds: Normal breath sounds. No wheezing.  Abdominal:     General: Bowel sounds are normal.     Palpations: Abdomen is soft.     Tenderness: There is no abdominal tenderness.  Musculoskeletal:        General: No tenderness.     Cervical back: Neck supple. No tenderness.     Comments: Pedal and ankle swelling - minimal.  DP pulses palpable and equal bilateral.  Left hand and arm slightly more swollen than left.  No pain.   Lymphadenopathy:     Cervical: No cervical adenopathy.   Skin:    Findings: No erythema or rash.  Neurological:     Mental Status: She is alert.  Psychiatric:        Mood and Affect: Mood normal.  Behavior: Behavior normal.     Outpatient Encounter Medications as of 02/02/2022  Medication Sig   albuterol (VENTOLIN HFA) 108 (90 Base) MCG/ACT inhaler Inhale 2 puffs into the lungs every 6 (six) hours as needed.   azelastine (ASTELIN) 0.1 % nasal spray 1 spray in the morning and at bedtime.   docusate sodium (COLACE) 100 MG capsule Take 1 capsule (100 mg total) by mouth every 12 (twelve) hours.   fluticasone (FLONASE) 50 MCG/ACT nasal spray Place 2 sprays into both nostrils daily.   guaiFENesin (MUCINEX) 600 MG 12 hr tablet Take 2 tablets (1,200 mg total) by mouth 2 (two) times daily as needed for cough or to loosen phlegm.   hydrochlorothiazide (HYDRODIURIL) 25 MG tablet TAKE 1 TABLET(25 MG) BY MOUTH DAILY   meclizine (ANTIVERT) 25 MG tablet Take 1 tablet (25 mg total) by mouth 3 (three) times daily as needed for dizziness.   nystatin cream (MYCOSTATIN) Apply 1 application topically 2 (two) times daily.   pantoprazole (PROTONIX) 40 MG tablet TAKE 1 TABLET(40 MG) BY MOUTH DAILY   pravastatin (PRAVACHOL) 80 MG tablet Take 1 tablet (80 mg total) by mouth daily.   Probiotic Product (ALIGN PO) Take by mouth.   [DISCONTINUED] pravastatin (PRAVACHOL) 40 MG tablet TAKE 1 TABLET EVERY DAY   No facility-administered encounter medications on file as of 02/02/2022.     Lab Results  Component Value Date   WBC 7.1 11/25/2021   HGB 13.9 11/25/2021   HCT 43.6 11/25/2021   PLT 322 11/25/2021   GLUCOSE 118 (H) 01/31/2022   CHOL 199 01/31/2022   TRIG 125.0 01/31/2022   HDL 53.70 01/31/2022   LDLDIRECT 142.7 12/11/2013   LDLCALC 120 (H) 01/31/2022   ALT 14 01/31/2022   AST 19 01/31/2022   NA 137 01/31/2022   K 3.8 01/31/2022   CL 101 01/31/2022   CREATININE 0.83 01/31/2022   BUN 13 01/31/2022   CO2 29 01/31/2022   TSH 1.92 09/27/2021   INR  0.9 11/25/2021   HGBA1C 6.2 01/31/2022   MICROALBUR 0.9 02/02/2022    CT HEAD WO CONTRAST  Result Date: 11/25/2021 CLINICAL DATA:  Right-sided facial droop EXAM: CT HEAD WITHOUT CONTRAST TECHNIQUE: Contiguous axial images were obtained from the base of the skull through the vertex without intravenous contrast. COMPARISON:  None. FINDINGS: Brain: There is no evidence of acute intracranial hemorrhage, extra-axial fluid collection, or acute infarct. Parenchymal volume is normal. The ventricles are normal in size. There is no mass lesion. There is no midline shift. An empty sella is incidentally noted. Vascular: No hyperdense vessel or unexpected calcification. Skull: Normal. Negative for fracture or focal lesion. Sinuses/Orbits: The paranasal sinuses are clear. The globes and orbits are unremarkable. Other: None. IMPRESSION: No acute intracranial pathology. If MRI is obtained to rule out stroke, consider obtaining postcontrast imaging to evaluate for enhancement of the facial nerve. Electronically Signed   By: Valetta Mole M.D.   On: 11/25/2021 10:03   MR BRAIN WO CONTRAST  Result Date: 11/25/2021 CLINICAL DATA:  Neuro deficit, acute, stroke suspected EXAM: MRI HEAD WITHOUT CONTRAST TECHNIQUE: Multiplanar, multiecho pulse sequences of the brain and surrounding structures were obtained without intravenous contrast. COMPARISON:  None. FINDINGS: Brain: There is no acute infarction or intracranial hemorrhage. There is no intracranial mass, mass effect, or edema. There is no hydrocephalus or extra-axial fluid collection. Ventricles and sulci are within normal limits in size and configuration. Minimal patchy foci of T2 hyperintensity in the supratentorial white matter are nonspecific  but may reflect minor chronic microvascular ischemic changes. Vascular: Major vessel flow voids at the skull base are preserved. Skull and upper cervical spine: Normal marrow signal is preserved. Sinuses/Orbits: Paranasal sinuses  are aerated. Orbits are unremarkable. Other: Sella is partially empty.  Mastoid air cells are clear. IMPRESSION: No evidence of recent infarction, hemorrhage, or mass. Minor chronic microvascular ischemic changes. Electronically Signed   By: Macy Mis M.D.   On: 11/25/2021 13:49       Assessment & Plan:   Problem List Items Addressed This Visit     Asthma    Breathing stable.  Has rescue inhaler if needed.        B12 deficiency    Continue b12 injections.       GERD (gastroesophageal reflux disease)    Continue protonix.  Follow.       Relevant Medications   Probiotic Product (ALIGN PO)   Hypercholesterolemia    The 10-year ASCVD risk score (Arnett DK, et al., 2019) is: 33.4%   Values used to calculate the score:     Age: 9 years     Sex: Female     Is Non-Hispanic African American: Yes     Diabetic: Yes     Tobacco smoker: No     Systolic Blood Pressure: 562 mmHg     Is BP treated: Yes     HDL Cholesterol: 53.7 mg/dL     Total Cholesterol: 199 mg/dL  Discussed labs.  On pravastatin 61m q day.  Increase to 812mq day.  Follow lipid panel and liver function tests.       Relevant Medications   pravastatin (PRAVACHOL) 80 MG tablet   Other Relevant Orders   Hepatic function panel   Lipid panel   Hypertension    Continue hctz.  Blood pressure as outlined. Follow pressures.  Follow metabolic panel.       Relevant Medications   pravastatin (PRAVACHOL) 80 MG tablet   Left upper extremity swelling    Increased left hand and arm swelling.  Radial pulse normal.  No pain.  Check ultrasound to confirm no DVT.  Follow.  Elevate when sitting, etc.       Low back pain    Has been to therapy.  Continue home exercises.       OSA (obstructive sleep apnea)    CPAP - need to use regularly.       Type 2 diabetes mellitus with hyperglycemia (HCC) - Primary    Low carb diet and exercise given elevated blood sugars. Follow met b and a1c.  Lab Results  Component Value Date    HGBA1C 6.2 01/31/2022       Relevant Medications   pravastatin (PRAVACHOL) 80 MG tablet   Other Relevant Orders   Urine Microalbumin w/creat. ratio (Completed)   Hemoglobin A1B6L Basic metabolic panel     ChEinar PheasantMD

## 2022-02-02 NOTE — Assessment & Plan Note (Addendum)
The 10-year ASCVD risk score (Arnett DK, et al., 2019) is: 33.4% ?  Values used to calculate the score: ?    Age: 76 years ?    Sex: Female ?    Is Non-Hispanic African American: Yes ?    Diabetic: Yes ?    Tobacco smoker: No ?    Systolic Blood Pressure: 122 mmHg ?    Is BP treated: Yes ?    HDL Cholesterol: 53.7 mg/dL ?    Total Cholesterol: 199 mg/dL  ?Discussed labs.  On pravastatin 40mg  q day.  Increase to 80mg  q day.  Follow lipid panel and liver function tests.  ?

## 2022-02-05 ENCOUNTER — Encounter: Payer: Self-pay | Admitting: Internal Medicine

## 2022-02-05 NOTE — Assessment & Plan Note (Signed)
Low carb diet and exercise given elevated blood sugars. Follow met b and a1c.  ?Lab Results  ?Component Value Date  ? HGBA1C 6.2 01/31/2022  ? ?

## 2022-02-05 NOTE — Assessment & Plan Note (Signed)
Increased left hand and arm swelling.  Radial pulse normal.  No pain.  Check ultrasound to confirm no DVT.  Follow.  Elevate when sitting, etc.  ?

## 2022-02-05 NOTE — Assessment & Plan Note (Signed)
Continue hctz.  Blood pressure as outlined.  Follow pressures.  Follow metabolic panel.  

## 2022-02-05 NOTE — Assessment & Plan Note (Signed)
Continue protonix.  Follow.   

## 2022-02-05 NOTE — Assessment & Plan Note (Signed)
Breathing stable.  Has rescue inhaler if needed.   

## 2022-02-05 NOTE — Assessment & Plan Note (Signed)
CPAP - need to use regularly.  ?

## 2022-02-05 NOTE — Assessment & Plan Note (Signed)
Has been to therapy.  Continue home exercises.  ?

## 2022-02-05 NOTE — Assessment & Plan Note (Signed)
Continue b12 injections.  

## 2022-02-07 ENCOUNTER — Other Ambulatory Visit: Payer: Self-pay

## 2022-02-07 ENCOUNTER — Ambulatory Visit (INDEPENDENT_AMBULATORY_CARE_PROVIDER_SITE_OTHER): Payer: Medicare PPO | Admitting: *Deleted

## 2022-02-07 DIAGNOSIS — E538 Deficiency of other specified B group vitamins: Secondary | ICD-10-CM

## 2022-02-07 MED ORDER — CYANOCOBALAMIN 1000 MCG/ML IJ SOLN
1000.0000 ug | Freq: Once | INTRAMUSCULAR | Status: AC
  Administered 2022-02-07: 1000 ug via INTRAMUSCULAR

## 2022-02-07 NOTE — Progress Notes (Signed)
Pt arrived for B12 injection, given in R deltoid. Pt tolerated injection well, showed no signs of distress nor voiced any concerns.  

## 2022-02-21 DIAGNOSIS — I10 Essential (primary) hypertension: Secondary | ICD-10-CM | POA: Diagnosis not present

## 2022-02-21 DIAGNOSIS — J45909 Unspecified asthma, uncomplicated: Secondary | ICD-10-CM | POA: Diagnosis not present

## 2022-02-21 DIAGNOSIS — E119 Type 2 diabetes mellitus without complications: Secondary | ICD-10-CM | POA: Diagnosis not present

## 2022-02-21 DIAGNOSIS — E669 Obesity, unspecified: Secondary | ICD-10-CM | POA: Diagnosis not present

## 2022-02-21 DIAGNOSIS — K5900 Constipation, unspecified: Secondary | ICD-10-CM | POA: Diagnosis not present

## 2022-02-21 DIAGNOSIS — K219 Gastro-esophageal reflux disease without esophagitis: Secondary | ICD-10-CM | POA: Diagnosis not present

## 2022-02-21 DIAGNOSIS — R42 Dizziness and giddiness: Secondary | ICD-10-CM | POA: Diagnosis not present

## 2022-02-21 DIAGNOSIS — G4733 Obstructive sleep apnea (adult) (pediatric): Secondary | ICD-10-CM | POA: Diagnosis not present

## 2022-02-21 DIAGNOSIS — E785 Hyperlipidemia, unspecified: Secondary | ICD-10-CM | POA: Diagnosis not present

## 2022-03-10 ENCOUNTER — Ambulatory Visit: Payer: Medicare PPO

## 2022-03-15 ENCOUNTER — Ambulatory Visit (INDEPENDENT_AMBULATORY_CARE_PROVIDER_SITE_OTHER): Payer: Medicare PPO

## 2022-03-15 VITALS — BP 115/77 | Ht 63.0 in | Wt 180.0 lb

## 2022-03-15 DIAGNOSIS — Z0000 Encounter for general adult medical examination without abnormal findings: Secondary | ICD-10-CM

## 2022-03-15 NOTE — Patient Instructions (Addendum)
?  Lisa Robles , ?Thank you for taking time to come for your Medicare Wellness Visit. I appreciate your ongoing commitment to your health goals. Please review the following plan we discussed and let me know if I can assist you in the future.  ? ?These are the goals we discussed: ? Goals   ? ?  ? Patient Stated  ?   I would like to lose weight (pt-stated)   ?   Weight goal 160lb ?Stay active ?Stay hydrated ?Reduce sugar intake ?  ? ?  ?  ?This is a list of the screening recommended for you and due dates:  ?Health Maintenance  ?Topic Date Due  ? Eye exam for diabetics  02/15/2022  ? COVID-19 Vaccine (5 - Booster for Pfizer series) 03/31/2022*  ? Tetanus Vaccine  03/16/2023*  ? Flu Shot  07/04/2022  ? Hemoglobin A1C  07/31/2022  ? Complete foot exam   09/29/2022  ? Urine Protein Check  02/03/2023  ? Pneumonia Vaccine  Completed  ? DEXA scan (bone density measurement)  Completed  ? Hepatitis C Screening: USPSTF Recommendation to screen - Ages 10-79 yo.  Completed  ? Zoster (Shingles) Vaccine  Completed  ? HPV Vaccine  Aged Out  ? Colon Cancer Screening  Discontinued  ?*Topic was postponed. The date shown is not the original due date.  ?  ?

## 2022-03-15 NOTE — Progress Notes (Signed)
Subjective:   Lisa Robles is a 76 y.o. female who presents for Medicare Annual (Subsequent) preventive examination.  Review of Systems    No ROS.  Medicare Wellness Virtual Visit.  Visual/audio telehealth visit, UTA vital signs.   See social history for additional risk factors.   Cardiac Risk Factors include: advanced age (>64men, >28 women);hypertension;diabetes mellitus     Objective:    Today's Vitals   03/15/22 0913  BP: 115/77  Weight: 180 lb (81.6 kg)  Height: 5\' 3"  (1.6 m)   Body mass index is 31.89 kg/m.     03/15/2022    9:35 AM 11/25/2021    9:30 AM 10/12/2021    9:58 AM 03/09/2021   10:45 AM 03/08/2020    2:16 PM 03/02/2020    1:46 PM 03/05/2019    1:22 PM  Advanced Directives  Does Patient Have a Medical Advance Directive? No Yes Yes Yes Yes Yes No  Type of Special educational needs teacher of [place];Living will Healthcare Power of [place];Living will Healthcare Power of [place];Living will Healthcare Power of [place];Living will Healthcare Power of [place];Living will   Does patient want to make changes to medical advance directive?   Yes (Inpatient - patient defers changing a medical advance directive and declines information at this time) No - Patient declined  No - Patient declined   Copy of Healthcare Power of Attorney in Chart?    No - copy requested No - copy requested    Would patient like information on creating a medical advance directive? No - Patient declined      Yes (MAU/Ambulatory/Procedural Areas - Information given)    Current Medications (verified) Outpatient Encounter Medications as of 03/15/2022  Medication Sig   albuterol (VENTOLIN HFA) 108 (90 Base) MCG/ACT inhaler Inhale 2 puffs into the lungs every 6 (six) hours as needed.   azelastine (ASTELIN) 0.1 % nasal spray 1 spray in the morning and at bedtime.   docusate sodium (COLACE) 100 MG capsule Take 1 capsule (100 mg total) by mouth every 12 (twelve) hours.   fluticasone (FLONASE)  50 MCG/ACT nasal spray Place 2 sprays into both nostrils daily.   guaiFENesin (MUCINEX) 600 MG 12 hr tablet Take 2 tablets (1,200 mg total) by mouth 2 (two) times daily as needed for cough or to loosen phlegm.   hydrochlorothiazide (HYDRODIURIL) 25 MG tablet TAKE 1 TABLET(25 MG) BY MOUTH DAILY   meclizine (ANTIVERT) 25 MG tablet Take 1 tablet (25 mg total) by mouth 3 (three) times daily as needed for dizziness.   nystatin cream (MYCOSTATIN) Apply 1 application topically 2 (two) times daily.   pantoprazole (PROTONIX) 40 MG tablet TAKE 1 TABLET(40 MG) BY MOUTH DAILY   pravastatin (PRAVACHOL) 80 MG tablet Take 1 tablet (80 mg total) by mouth daily.   Probiotic Product (ALIGN PO) Take by mouth.   No facility-administered encounter medications on file as of 03/15/2022.   Allergies (verified) Cinnamon, Bactrim [sulfamethoxazole-trimethoprim], and Shellfish allergy   History: Past Medical History:  Diagnosis Date   Diabetes mellitus without complication (HCC)    GERD (gastroesophageal reflux disease)    Hypercholesteremia    Hypertension    Past Surgical History:  Procedure Laterality Date   BREAST BIOPSY     TOOTH EXTRACTION     tooth implantation   TUBAL LIGATION  1979   Family History  Problem Relation Age of Onset   Heart attack Mother    Heart attack Father    Hypercholesterolemia Brother  Diabetes Brother    Cancer Maternal Aunt        breast   Diabetes Maternal Grandmother    Schizophrenia Grandson    Colon cancer Neg Hx    Breast cancer Neg Hx    Social History   Socioeconomic History   Marital status: Widowed    Spouse name: Not on file   Number of children: 2   Years of education: masters   Highest education level: Not on file  Occupational History   Occupation: retired Runner, broadcasting/film/video  Tobacco Use   Smoking status: Former    Packs/day: 1.00    Years: 7.00    Pack years: 7.00    Types: Cigarettes    Quit date: 12/04/1977    Years since quitting: 44.3   Smokeless  tobacco: Never  Vaping Use   Vaping Use: Never used  Substance and Sexual Activity   Alcohol use: No    Alcohol/week: 0.0 standard drinks   Drug use: No   Sexual activity: Yes    Birth control/protection: None    Comment: Is sexually active wih same partner x 14 years. Recently found out partner has been cheating.  Other Topics Concern   Not on file  Social History Narrative   Not on file   Social Determinants of Health   Financial Resource Strain: Low Risk    Difficulty of Paying Living Expenses: Not hard at all  Food Insecurity: No Food Insecurity   Worried About Programme researcher, broadcasting/film/video in the Last Year: Never true   Ran Out of Food in the Last Year: Never true  Transportation Needs: No Transportation Needs   Lack of Transportation (Medical): No   Lack of Transportation (Non-Medical): No  Physical Activity: Insufficiently Active   Days of Exercise per Week: 7 days   Minutes of Exercise per Session: 20 min  Stress: No Stress Concern Present   Feeling of Stress : Not at all  Social Connections: Unknown   Frequency of Communication with Friends and Family: More than three times a week   Frequency of Social Gatherings with Friends and Family: More than three times a week   Attends Religious Services: Not on file   Active Member of Clubs or Organizations: Yes   Attends Banker Meetings: Not on file   Marital Status: Widowed   Tobacco Counseling Counseling given: Not Answered   Clinical Intake:  Pre-visit preparation completed: Yes        Diabetes:  (Followed by PCP)  How often do you need to have someone help you when you read instructions, pamphlets, or other written materials from your doctor or pharmacy?: 1 - Never  Interpreter Needed?: No    Activities of Daily Living    03/15/2022    9:37 AM  In your present state of health, do you have any difficulty performing the following activities:  Hearing? 0  Vision? 0  Difficulty concentrating or  making decisions? 0  Walking or climbing stairs? 0  Dressing or bathing? 0  Doing errands, shopping? 0  Preparing Food and eating ? N  Using the Toilet? N  In the past six months, have you accidently leaked urine? N  Do you have problems with loss of bowel control? N  Managing your Medications? N  Managing your Finances? N  Housekeeping or managing your Housekeeping? N   Patient Care Team: Dale Estancia, MD as PCP - General (Internal Medicine) Debbe Odea, MD as PCP - Cardiology (Cardiology)  Indicate any  recent Medical Services you may have received from other than [facility] providers in the past year (date may be approximate).     Assessment:   This is a routine wellness examination for [place].  Virtual Visit via Telephone Note  I connected with  Keitha Butte on 03/15/22 at  9:00 AM EDT by telephone and verified that I am speaking with the correct person using two identifiers.  Persons participating in the virtual visit: patient/Nurse Health Advisor   I discussed the limitations of performing an evaluation and management service by telehealth.  The patient expressed understanding and agreed to proceed. We continued and completed visit with audio only. Some vital signs may be absent or patient reported.   Hearing/Vision screen Hearing Screening - Comments:: Patient is able to hear conversational tones without difficulty. No issues reported. Vision Screening - Comments:: Wears corrective lenses. No retinopathy.   Dietary issues and exercise activities discussed: Current Exercise Habits: Home exercise routine, Type of exercise: yoga (Young at heart program), Frequency (Times/Week): 5, Intensity: Mild Intermittent fasting Fair water intake   Goals Addressed               This Visit's Progress     Patient Stated     I would like to lose weight (pt-stated)        Weight goal 160lb Stay active Stay hydrated Reduce sugar intake       Depression Screen     03/15/2022    9:37 AM 02/02/2022   10:47 AM 09/29/2021   10:47 AM 03/09/2021   10:44 AM 03/08/2020    1:51 PM 03/02/2020    1:46 PM 03/05/2019    1:25 PM  PHQ 2/9 Scores  PHQ - 2 Score 0 2 0 0 0 0 0  PHQ- 9 Score 0 4         Fall Risk    03/15/2022    9:36 AM 02/02/2022   10:47 AM 09/29/2021   10:48 AM 03/09/2021   10:46 AM 09/13/2020    1:19 PM  Fall Risk   Falls in the past year?   1 1 0  Number falls in past yr:   0 0   Injury with Fall?   1 1   Comment    Notes she passed out while on vacation. Sought medical care for cut on eye   Risk for fall due to :  History of fall(s) History of fall(s)    Follow up Falls evaluation completed Falls evaluation completed Falls evaluation completed Falls evaluation completed Falls evaluation completed   FALL RISK PREVENTION PERTAINING TO THE HOME: Home free of loose throw rugs in walkways, pet beds, electrical cords, etc? Yes  Adequate lighting in your home to reduce risk of falls? Yes   ASSISTIVE DEVICES UTILIZED TO PREVENT FALLS: Life alert? No  Use of a cane, walker or w/c? No   TIMED UP AND GO: Was the test performed? No .   Cognitive Function: Patient is alert and oriented x3.     01/18/2017    8:22 AM 01/19/2016    8:36 AM  MMSE - Mini Mental State Exam  Orientation to time 5 5  Orientation to Place 5 5  Registration 3 3  Attention/ Calculation 5 5  Recall 3 3  Language- name 2 objects 2 2  Language- repeat 1 1  Language- follow 3 step command 3 3  Language- read & follow direction 1 1  Write a sentence 1 1  Copy  design 1 1  Total score 30 30        03/08/2020    1:57 PM 03/05/2019    1:29 PM 01/21/2018    9:05 AM  6CIT Screen  What Year? 0 points 0 points 0 points  What month? 0 points 0 points 0 points  What time? 0 points 0 points 0 points  Count back from 20 0 points 0 points 0 points  Months in reverse 0 points 0 points 0 points  Repeat phrase 0 points 0 points 0 points  Total Score 0 points 0 points 0 points     Immunizations Immunization History  Administered Date(s) Administered   Fluad Quad(high Dose 65+) 08/28/2019, 09/22/2020, 09/12/2021   Influenza, High Dose Seasonal PF 08/17/2016, 08/22/2017, 08/28/2018   Influenza,inj,Quad PF,6+ Mos 07/29/2014   Influenza-Unspecified 09/01/2013, 09/09/2015   PFIZER(Purple Top)SARS-COV-2 Vaccination 12/10/2019, 12/31/2019, 09/03/2020, 04/22/2021   Pneumococcal Conjugate-13 12/01/2015   Pneumococcal Polysaccharide-23 07/18/2011, 06/28/2017   Tdap 05/19/2011   Zoster Recombinat (Shingrix) 04/09/2017, 06/28/2017   TDAP status: Due, Education has been provided regarding the importance of this vaccine. Advised may receive this vaccine at local pharmacy or Health Dept. Aware to provide a copy of the vaccination record if obtained from local pharmacy or Health Dept. Verbalized acceptance and understanding. Deferred.   Screening Tests Health Maintenance  Topic Date Due   OPHTHALMOLOGY EXAM  02/15/2022   COVID-19 Vaccine (5 - Booster for Pfizer series) 03/31/2022 (Originally 06/17/2021)   TETANUS/TDAP  03/16/2023 (Originally 05/18/2021)   INFLUENZA VACCINE  07/04/2022   HEMOGLOBIN A1C  07/31/2022   FOOT EXAM  09/29/2022   URINE MICROALBUMIN  02/03/2023   Pneumonia Vaccine 53+ Years old  Completed   DEXA SCAN  Completed   Hepatitis C Screening  Completed   Zoster Vaccines- Shingrix  Completed   HPV VACCINES  Aged Out   COLONOSCOPY (Pts 45-26yrs Insurance coverage will need to be confirmed)  Discontinued   Health Maintenance Health Maintenance Due  Topic Date Due   OPHTHALMOLOGY EXAM  02/15/2022   Mammogram- completed 11/04/2021.   Bone density- ordered 10/09/21. Plans to schedule.  Lung Cancer Screening: (Low Dose CT Chest recommended if Age 59-80 years, 30 pack-year currently smoking OR have quit w/in 15years.) does not qualify.   Vision Screening: Recommended annual ophthalmology exams for early detection of glaucoma and other disorders of the  eye.  Dental Screening: Recommended annual dental exams for proper oral hygiene  Community Resource Referral / Chronic Care Management: CRR required this visit?  No   CCM required this visit?  No      Plan:   Keep all routine maintenance appointments.   I have personally reviewed and noted the following in the patient's chart:   Medical and social history Use of alcohol, tobacco or illicit drugs  Current medications and supplements including opioid prescriptions.  Functional ability and status Nutritional status Physical activity Advanced directives List of other physicians Hospitalizations, surgeries, and ER visits in previous 12 months Vitals Screenings to include cognitive, depression, and falls Referrals and appointments  In addition, I have reviewed and discussed with patient certain preventive protocols, quality metrics, and best practice recommendations. A written personalized care plan for preventive services as well as general preventive health recommendations were provided to patient.     Ashok Pall, LPN   1/61/0960

## 2022-04-12 ENCOUNTER — Other Ambulatory Visit: Payer: Self-pay

## 2022-04-12 ENCOUNTER — Telehealth: Payer: Self-pay

## 2022-04-12 MED ORDER — HYDROCHLOROTHIAZIDE 25 MG PO TABS: 25.0000 mg | ORAL_TABLET | Freq: Every day | ORAL | 1 refills | Status: DC

## 2022-04-12 NOTE — Telephone Encounter (Signed)
Misty called from [place] to request that we call, fax, or e-scribe a prescription for patient for HCTZ through mail order with a 90-day supply. ? ?Misty's E-scribe fax number is [phone]. ?

## 2022-04-12 NOTE — Telephone Encounter (Signed)
sent 

## 2022-04-27 ENCOUNTER — Ambulatory Visit (INDEPENDENT_AMBULATORY_CARE_PROVIDER_SITE_OTHER): Payer: Medicare PPO

## 2022-04-27 DIAGNOSIS — E538 Deficiency of other specified B group vitamins: Secondary | ICD-10-CM

## 2022-04-27 MED ORDER — CYANOCOBALAMIN 1000 MCG/ML IJ SOLN
1000.0000 ug | Freq: Once | INTRAMUSCULAR | Status: AC
  Administered 2022-04-27: 1000 ug via INTRAMUSCULAR

## 2022-04-27 NOTE — Progress Notes (Signed)
Veda Setzler presents today for injection per MD orders. B12 injection administered IM in left Upper Arm. Administration without incident. Patient tolerated well.  Shandon Burlingame,cma   

## 2022-06-02 ENCOUNTER — Other Ambulatory Visit: Payer: Medicare PPO

## 2022-06-02 ENCOUNTER — Ambulatory Visit (INDEPENDENT_AMBULATORY_CARE_PROVIDER_SITE_OTHER): Payer: Medicare PPO

## 2022-06-02 DIAGNOSIS — E1165 Type 2 diabetes mellitus with hyperglycemia: Secondary | ICD-10-CM

## 2022-06-02 DIAGNOSIS — E538 Deficiency of other specified B group vitamins: Secondary | ICD-10-CM | POA: Diagnosis not present

## 2022-06-02 DIAGNOSIS — E7800 Pure hypercholesterolemia, unspecified: Secondary | ICD-10-CM | POA: Diagnosis not present

## 2022-06-02 LAB — BASIC METABOLIC PANEL
BUN: 13 mg/dL (ref 6–23)
CO2: 29 mEq/L (ref 19–32)
Calcium: 9.7 mg/dL (ref 8.4–10.5)
Chloride: 103 mEq/L (ref 96–112)
Creatinine, Ser: 0.85 mg/dL (ref 0.40–1.20)
GFR: 65.81 mL/min (ref >60.00)
Glucose, Bld: 113 mg/dL — ABNORMAL HIGH (ref 70–99)
Potassium: 3.8 mEq/L (ref 3.5–5.1)
Sodium: 137 mEq/L (ref 135–145)

## 2022-06-02 LAB — LIPID PANEL
Cholesterol: 167 mg/dL (ref 0–200)
HDL: 51 mg/dL (ref >39.00)
LDL Cholesterol: 95 mg/dL (ref 0–99)
NonHDL: 116.47
Total CHOL/HDL Ratio: 3
Triglycerides: 109 mg/dL (ref 0.0–149.0)
VLDL: 21.8 mg/dL (ref 0.0–40.0)

## 2022-06-02 LAB — HEPATIC FUNCTION PANEL
ALT: 12 U/L (ref 0–35)
AST: 16 U/L (ref 0–37)
Albumin: 3.8 g/dL (ref 3.5–5.2)
Alkaline Phosphatase: 73 U/L (ref 39–117)
Bilirubin, Direct: 0.1 mg/dL (ref 0.0–0.3)
Total Bilirubin: 0.5 mg/dL (ref 0.2–1.2)
Total Protein: 6.9 g/dL (ref 6.0–8.3)

## 2022-06-02 LAB — HEMOGLOBIN A1C: Hgb A1c MFr Bld: 6.2 % (ref 4.6–6.5)

## 2022-06-02 MED ORDER — CYANOCOBALAMIN 1000 MCG/ML IJ SOLN
1000.0000 ug | Freq: Once | INTRAMUSCULAR | Status: AC
  Administered 2022-06-02: 1000 ug via INTRAMUSCULAR

## 2022-06-02 NOTE — Progress Notes (Signed)
Patient presented for B 12 injection to left deltoid, patient voiced no concerns nor showed any signs of distress during injection. 

## 2022-06-09 ENCOUNTER — Encounter: Payer: Medicare PPO | Admitting: Internal Medicine

## 2022-06-26 ENCOUNTER — Other Ambulatory Visit: Payer: Self-pay | Admitting: Internal Medicine

## 2022-07-03 ENCOUNTER — Other Ambulatory Visit: Payer: Self-pay | Admitting: Internal Medicine

## 2022-07-25 ENCOUNTER — Encounter: Payer: Self-pay | Admitting: Internal Medicine

## 2022-07-25 ENCOUNTER — Other Ambulatory Visit: Payer: Self-pay

## 2022-07-25 ENCOUNTER — Ambulatory Visit (INDEPENDENT_AMBULATORY_CARE_PROVIDER_SITE_OTHER): Payer: Medicare PPO | Admitting: Internal Medicine

## 2022-07-25 DIAGNOSIS — E7800 Pure hypercholesterolemia, unspecified: Secondary | ICD-10-CM

## 2022-07-25 DIAGNOSIS — K219 Gastro-esophageal reflux disease without esophagitis: Secondary | ICD-10-CM | POA: Diagnosis not present

## 2022-07-25 DIAGNOSIS — I10 Essential (primary) hypertension: Secondary | ICD-10-CM | POA: Diagnosis not present

## 2022-07-25 DIAGNOSIS — J4520 Mild intermittent asthma, uncomplicated: Secondary | ICD-10-CM

## 2022-07-25 DIAGNOSIS — E1165 Type 2 diabetes mellitus with hyperglycemia: Secondary | ICD-10-CM | POA: Diagnosis not present

## 2022-07-25 DIAGNOSIS — D649 Anemia, unspecified: Secondary | ICD-10-CM

## 2022-07-25 DIAGNOSIS — G4733 Obstructive sleep apnea (adult) (pediatric): Secondary | ICD-10-CM

## 2022-07-25 DIAGNOSIS — Z0000 Encounter for general adult medical examination without abnormal findings: Secondary | ICD-10-CM | POA: Diagnosis not present

## 2022-07-25 MED ORDER — PRAVASTATIN SODIUM 40 MG PO TABS: 40.0000 mg | ORAL_TABLET | Freq: Every day | ORAL | 0 refills | Status: DC

## 2022-07-25 MED ORDER — AZELASTINE HCL 0.1 % NA SOLN: 1.0000 | Freq: Two times a day (BID) | NASAL | 1 refills | Status: AC

## 2022-07-25 NOTE — Progress Notes (Signed)
Patient ID: Lisa Robles, female   DOB: [birth year], 76 y.o.   MRN: [id number]   Subjective:    Patient ID: Lisa Robles, female    DOB: 1946/04/25, 76 y.o.   MRN: [id number]   Patient here for  Chief Complaint  Patient presents with   Annual Exam   .   HPI Here for physical exam. Recent [place].  Treaed with steroids and valtrex.  Doing well.  Doing yoga 2x/week.  Exercises at [facility].  No chest pain or sob reported.  No abdominal pain.  Bowels moving.  Some allergies and sinus issues.  Taking zyrtec q hs.  Discussed f/u with Dr Richardson Landry.     Past Medical History:  Diagnosis Date   Diabetes mellitus without complication (HCC)    GERD (gastroesophageal reflux disease)    Hypercholesteremia    Hypertension    Past Surgical History:  Procedure Laterality Date   BREAST BIOPSY     TOOTH EXTRACTION     tooth implantation   TUBAL LIGATION  1979   Family History  Problem Relation Age of Onset   Heart attack Mother    Heart attack Father    Hypercholesterolemia Brother    Diabetes Brother    Cancer Maternal Aunt        breast   Diabetes Maternal Grandmother    Schizophrenia Grandson    Colon cancer Neg Hx    Breast cancer Neg Hx    Social History   Socioeconomic History   Marital status: Widowed    Spouse name: Not on file   Number of children: 2   Years of education: masters   Highest education level: Not on file  Occupational History   Occupation: retired Pharmacist, hospital  Tobacco Use   Smoking status: Former    Packs/day: 1.00    Years: 7.00    Total pack years: 7.00    Types: Cigarettes    Quit date: 12/04/1977    Years since quitting: 44.7   Smokeless tobacco: Never  Vaping Use   Vaping Use: Never used  Substance and Sexual Activity   Alcohol use: No    Alcohol/week: 0.0 standard drinks of alcohol   Drug use: No   Sexual activity: Yes    Birth control/protection: None    Comment: Is sexually active wih same partner x 14 years. Recently found out partner has  been cheating.  Other Topics Concern   Not on file  Social History Narrative   Not on file   Social Determinants of Health   Financial Resource Strain: Low Risk  (03/15/2022)   Overall Financial Resource Strain (CARDIA)    Difficulty of Paying Living Expenses: Not hard at all  Food Insecurity: No Food Insecurity (03/15/2022)   Hunger Vital Sign    Worried About Running Out of Food in the Last Year: Never true    Ran Out of Food in the Last Year: Never true  Transportation Needs: No Transportation Needs (03/15/2022)   PRAPARE - Hydrologist (Medical): No    Lack of Transportation (Non-Medical): No  Physical Activity: Insufficiently Active (03/15/2022)   Exercise Vital Sign    Days of Exercise per Week: 7 days    Minutes of Exercise per Session: 20 min  Stress: No Stress Concern Present (03/15/2022)   [place]    Feeling of Stress : Not at all  Social Connections: Unknown (03/15/2022)   Social Connection and Isolation  Panel [NHANES]    Frequency of Communication with Friends and Family: More than three times a week    Frequency of Social Gatherings with Friends and Family: More than three times a week    Attends Religious Services: Not on file    Active Member of Clubs or Organizations: Yes    Attends Archivist Meetings: Not on file    Marital Status: Widowed     Review of Systems  Constitutional:  Negative for appetite change and unexpected weight change.  HENT:  Negative for sinus pressure and sore throat.        Some allergy symptoms as outlined.   Eyes:  Negative for pain and visual disturbance.  Respiratory:  Negative for cough, chest tightness and shortness of breath.   Cardiovascular:  Negative for chest pain, palpitations and leg swelling.  Gastrointestinal:  Negative for abdominal pain, diarrhea, nausea and vomiting.  Genitourinary:  Negative for difficulty  urinating and dysuria.  Musculoskeletal:  Negative for joint swelling and myalgias.  Skin:  Negative for color change and rash.  Neurological:  Negative for dizziness, light-headedness and headaches.  Hematological:  Negative for adenopathy. Does not bruise/bleed easily.  Psychiatric/Behavioral:  Negative for agitation and dysphoric mood.        Objective:     BP 128/72 (BP Location: Left Arm, Patient Position: Sitting, Cuff Size: Large)   Pulse 75   Temp 98.4 F (36.9 C) (Oral)   Ht '5\' 3"'  (1.6 m)   Wt 183 lb 3.2 oz (83.1 kg)   SpO2 97%   BMI 32.45 kg/m  Wt Readings from Last 3 Encounters:  07/25/22 183 lb 3.2 oz (83.1 kg)  03/15/22 180 lb (81.6 kg)  02/02/22 184 lb 6.4 oz (83.6 kg)    Physical Exam Vitals reviewed.  Constitutional:      General: She is not in acute distress.    Appearance: Normal appearance. She is well-developed.  HENT:     Head: Normocephalic and atraumatic.     Right Ear: External ear normal.     Left Ear: External ear normal.  Eyes:     General: No scleral icterus.       Right eye: No discharge.        Left eye: No discharge.     Conjunctiva/sclera: Conjunctivae normal.  Neck:     Thyroid: No thyromegaly.  Cardiovascular:     Rate and Rhythm: Normal rate and regular rhythm.  Pulmonary:     Effort: No tachypnea, accessory muscle usage or respiratory distress.     Breath sounds: Normal breath sounds. No decreased breath sounds or wheezing.  Chest:  Breasts:    Right: No inverted nipple, mass, nipple discharge or tenderness (no axillary adenopathy).     Left: No inverted nipple, mass, nipple discharge or tenderness (no axilarry adenopathy).  Abdominal:     General: Bowel sounds are normal.     Palpations: Abdomen is soft.     Tenderness: There is no abdominal tenderness.  Musculoskeletal:        General: No swelling or tenderness.     Cervical back: Neck supple.  Lymphadenopathy:     Cervical: No cervical adenopathy.  Skin:    Findings:  No erythema or rash.  Neurological:     Mental Status: She is alert and oriented to person, place, and time.  Psychiatric:        Mood and Affect: Mood normal.        Behavior: Behavior normal.  Outpatient Encounter Medications as of 07/25/2022  Medication Sig   albuterol (VENTOLIN HFA) 108 (90 Base) MCG/ACT inhaler Inhale 2 puffs into the lungs every 6 (six) hours as needed.   docusate sodium (COLACE) 100 MG capsule Take 1 capsule (100 mg total) by mouth every 12 (twelve) hours.   fluticasone (FLONASE) 50 MCG/ACT nasal spray Place 2 sprays into both nostrils daily.   guaiFENesin (MUCINEX) 600 MG 12 hr tablet Take 2 tablets (1,200 mg total) by mouth 2 (two) times daily as needed for cough or to loosen phlegm.   hydrochlorothiazide (HYDRODIURIL) 25 MG tablet TAKE 1 TABLET(25 MG) BY MOUTH DAILY   meclizine (ANTIVERT) 25 MG tablet Take 1 tablet (25 mg total) by mouth 3 (three) times daily as needed for dizziness.   nystatin cream (MYCOSTATIN) Apply 1 application topically 2 (two) times daily.   pantoprazole (PROTONIX) 40 MG tablet TAKE 1 TABLET(40 MG) BY MOUTH DAILY   pravastatin (PRAVACHOL) 40 MG tablet Take 1 tablet (40 mg total) by mouth daily.   Probiotic Product (ALIGN PO) Take by mouth.   [DISCONTINUED] azelastine (ASTELIN) 0.1 % nasal spray 1 spray in the morning and at bedtime.   [DISCONTINUED] pravastatin (PRAVACHOL) 80 MG tablet TAKE 1 TABLET EVERY DAY   No facility-administered encounter medications on file as of 07/25/2022.     Lab Results  Component Value Date   WBC 7.1 11/25/2021   HGB 13.9 11/25/2021   HCT 43.6 11/25/2021   PLT 322 11/25/2021   GLUCOSE 113 (H) 06/02/2022   CHOL 167 06/02/2022   TRIG 109.0 06/02/2022   HDL 51.00 06/02/2022   LDLDIRECT 142.7 12/11/2013   LDLCALC 95 06/02/2022   ALT 12 06/02/2022   AST 16 06/02/2022   NA 137 06/02/2022   K 3.8 06/02/2022   CL 103 06/02/2022   CREATININE 0.85 06/02/2022   BUN 13 06/02/2022   CO2 29 06/02/2022    TSH 1.92 09/27/2021   INR 0.9 11/25/2021   HGBA1C 6.2 06/02/2022   MICROALBUR 0.9 02/02/2022    US Venous Img Upper Uni Left (DVT)  Result Date: 02/02/2022 CLINICAL DATA:  76 year old female with left upper extremity swelling. EXAM: LEFT UPPER EXTREMITY VENOUS DOPPLER ULTRASOUND TECHNIQUE: Gray-scale sonography with graded compression, as well as color Doppler and duplex ultrasound were performed to evaluate the upper extremity deep venous system from the level of the subclavian vein and including the jugular, axillary, basilic, radial, ulnar and upper cephalic vein. Spectral Doppler was utilized to evaluate flow at rest and with distal augmentation maneuvers. COMPARISON:  None. FINDINGS: Contralateral Subclavian Vein: Respiratory phasicity is normal and symmetric with the symptomatic side. No evidence of thrombus. Normal compressibility. Internal Jugular Vein: No evidence of thrombus. Normal compressibility, respiratory phasicity and response to augmentation. Subclavian Vein: No evidence of thrombus. Normal compressibility, respiratory phasicity and response to augmentation. Axillary Vein: No evidence of thrombus. Normal compressibility, respiratory phasicity and response to augmentation. Cephalic Vein: No evidence of thrombus. Normal compressibility, respiratory phasicity and response to augmentation. Basilic Vein: No evidence of thrombus. Normal compressibility, respiratory phasicity and response to augmentation. Brachial Veins: No evidence of thrombus. Normal compressibility, respiratory phasicity and response to augmentation. Radial Veins: No evidence of thrombus. Normal compressibility, respiratory phasicity and response to augmentation. Ulnar Veins: No evidence of thrombus. Normal compressibility, respiratory phasicity and response to augmentation. Other Findings:  None visualized. IMPRESSION: No evidence left upper extremity deep vein thrombosis. Ruthann Cancer, MD [facility] Inter[facility]  [facility] Radiology Electronically Signed  By: Ruthann Cancer M.D.   On: 02/02/2022 16:21       Assessment & Plan:   Problem List Items Addressed This Visit     Anemia    Follow cbc.       Asthma    Breathing stable.  Has rescue inhaler if needed.        GERD (gastroesophageal reflux disease)    Continue protonix.  Follow.       Health care maintenance    Physical today 07/25/22.  Mammogram 11/14/21 - Birads I.  cologuard 03/2021 - need results.        Hypercholesterolemia    The 10-year ASCVD risk score (Arnett DK, et al., 2019) is: 31.2%   Values used to calculate the score:     Age: 24 years     Sex: Female     Is Non-Hispanic African American: Yes     Diabetic: Yes     Tobacco smoker: No     Systolic Blood Pressure: 166 mmHg     Is BP treated: Yes     HDL Cholesterol: 51 mg/dL     Total Cholesterol: 167 mg/dL  Discussed labs.  On pravastatin  Follow lipid panel and liver function tests.       Relevant Medications   pravastatin (PRAVACHOL) 40 MG tablet   Other Relevant Orders   CBC with Differential/Platelet   Hepatic function panel   Lipid panel   TSH   Hypertension    Continue hctz.  Blood pressure as outlined. Follow pressures.  Follow metabolic panel.       Relevant Medications   pravastatin (PRAVACHOL) 40 MG tablet   OSA (obstructive sleep apnea)    Needs to use cpap reguarly.  Follow.       Type 2 diabetes mellitus with hyperglycemia (HCC)    Low carb diet and exercise given elevated blood sugars. Follow met b and a1c.  Lab Results  Component Value Date   HGBA1C 6.2 06/02/2022       Relevant Medications   pravastatin (PRAVACHOL) 40 MG tablet   Other Relevant Orders   Basic metabolic panel   Hemoglobin A1c     Einar Pheasant, MD

## 2022-07-25 NOTE — Assessment & Plan Note (Addendum)
Physical today 07/25/22.  Mammogram 11/14/21 - Birads I.  cologuard 03/2021 - need results.

## 2022-08-06 ENCOUNTER — Encounter: Payer: Self-pay | Admitting: Internal Medicine

## 2022-08-06 NOTE — Assessment & Plan Note (Signed)
Low carb diet and exercise given elevated blood sugars. Follow met b and a1c.  Lab Results  Component Value Date   HGBA1C 6.2 06/02/2022

## 2022-08-06 NOTE — Assessment & Plan Note (Signed)
Continue protonix.  Follow.   

## 2022-08-06 NOTE — Assessment & Plan Note (Signed)
Follow cbc.  

## 2022-08-06 NOTE — Assessment & Plan Note (Signed)
Breathing stable.  Has rescue inhaler if needed.   

## 2022-08-06 NOTE — Assessment & Plan Note (Signed)
Continue hctz.  Blood pressure as outlined.  Follow pressures.  Follow metabolic panel.  

## 2022-08-06 NOTE — Assessment & Plan Note (Signed)
Needs to use cpap reguarly.  Follow.

## 2022-08-06 NOTE — Assessment & Plan Note (Signed)
Physical today 07/25/22.

## 2022-08-06 NOTE — Assessment & Plan Note (Signed)
The 10-year ASCVD risk score (Arnett DK, et al., 2019) is: 31.2%   Values used to calculate the score:     Age: 76 years     Sex: Female     Is Non-Hispanic African American: Yes     Diabetic: Yes     Tobacco smoker: No     Systolic Blood Pressure: 128 mmHg     Is BP treated: Yes     HDL Cholesterol: 51 mg/dL     Total Cholesterol: 167 mg/dL  Discussed labs.  On pravastatin  Follow lipid panel and liver function tests.

## 2022-08-17 ENCOUNTER — Telehealth: Payer: Self-pay | Admitting: Internal Medicine

## 2022-08-17 NOTE — Telephone Encounter (Signed)
Pt called wanting advice on the rsv and covid vaccine

## 2022-08-18 NOTE — Telephone Encounter (Signed)
Regarding covid vaccine, there is a new covid vaccine coming.  The latest I heard will be available - end of September to mid October.  This will be a monovalent vaccine - to be geared toward newer strain of covid.  The RSV guidelines:  if you are over 60 or have a history of tobacco abuse or lung disease,  the RSV vaccine is recommended. [facility], [facility] and [facility] currently have the vaccine in stock.

## 2022-08-21 NOTE — Telephone Encounter (Signed)
Pt has been informed.

## 2022-09-01 DIAGNOSIS — Z20822 Contact with and (suspected) exposure to covid-19: Secondary | ICD-10-CM | POA: Diagnosis not present

## 2022-09-02 DIAGNOSIS — Z20822 Contact with and (suspected) exposure to covid-19: Secondary | ICD-10-CM | POA: Diagnosis not present

## 2022-09-26 DIAGNOSIS — D2271 Melanocytic nevi of right lower limb, including hip: Secondary | ICD-10-CM | POA: Diagnosis not present

## 2022-09-26 DIAGNOSIS — D2262 Melanocytic nevi of left upper limb, including shoulder: Secondary | ICD-10-CM | POA: Diagnosis not present

## 2022-09-26 DIAGNOSIS — D225 Melanocytic nevi of trunk: Secondary | ICD-10-CM | POA: Diagnosis not present

## 2022-09-26 DIAGNOSIS — D2261 Melanocytic nevi of right upper limb, including shoulder: Secondary | ICD-10-CM | POA: Diagnosis not present

## 2022-09-26 DIAGNOSIS — D2272 Melanocytic nevi of left lower limb, including hip: Secondary | ICD-10-CM | POA: Diagnosis not present

## 2022-09-26 DIAGNOSIS — L821 Other seborrheic keratosis: Secondary | ICD-10-CM | POA: Diagnosis not present

## 2022-09-26 DIAGNOSIS — Z20822 Contact with and (suspected) exposure to covid-19: Secondary | ICD-10-CM | POA: Diagnosis not present

## 2022-09-27 DIAGNOSIS — Z20822 Contact with and (suspected) exposure to covid-19: Secondary | ICD-10-CM | POA: Diagnosis not present

## 2022-09-29 DIAGNOSIS — Z20822 Contact with and (suspected) exposure to covid-19: Secondary | ICD-10-CM | POA: Diagnosis not present

## 2022-09-30 DIAGNOSIS — Z20822 Contact with and (suspected) exposure to covid-19: Secondary | ICD-10-CM | POA: Diagnosis not present

## 2022-10-02 DIAGNOSIS — J301 Allergic rhinitis due to pollen: Secondary | ICD-10-CM | POA: Diagnosis not present

## 2022-10-02 DIAGNOSIS — J019 Acute sinusitis, unspecified: Secondary | ICD-10-CM | POA: Diagnosis not present

## 2022-10-03 DIAGNOSIS — Z20822 Contact with and (suspected) exposure to covid-19: Secondary | ICD-10-CM | POA: Diagnosis not present

## 2022-10-04 DIAGNOSIS — Z20822 Contact with and (suspected) exposure to covid-19: Secondary | ICD-10-CM | POA: Diagnosis not present

## 2022-10-05 ENCOUNTER — Ambulatory Visit (INDEPENDENT_AMBULATORY_CARE_PROVIDER_SITE_OTHER): Payer: Medicare PPO

## 2022-10-05 DIAGNOSIS — E538 Deficiency of other specified B group vitamins: Secondary | ICD-10-CM | POA: Diagnosis not present

## 2022-10-05 MED ORDER — CYANOCOBALAMIN 1000 MCG/ML IJ SOLN
1000.0000 ug | Freq: Once | INTRAMUSCULAR | Status: AC
  Administered 2022-10-05: 1000 ug via INTRAMUSCULAR

## 2022-10-05 NOTE — Progress Notes (Signed)
Lisa Robles presents today for injection per MD orders. B12 injection administered IM in left Upper Arm. Administration without incident. Patient tolerated well.  Airelle Everding,cma

## 2022-10-07 DIAGNOSIS — Z20822 Contact with and (suspected) exposure to covid-19: Secondary | ICD-10-CM | POA: Diagnosis not present

## 2022-10-08 DIAGNOSIS — Z20822 Contact with and (suspected) exposure to covid-19: Secondary | ICD-10-CM | POA: Diagnosis not present

## 2022-10-18 ENCOUNTER — Other Ambulatory Visit: Payer: Self-pay | Admitting: Internal Medicine

## 2022-10-18 DIAGNOSIS — Z1231 Encounter for screening mammogram for malignant neoplasm of breast: Secondary | ICD-10-CM

## 2022-10-20 ENCOUNTER — Other Ambulatory Visit (INDEPENDENT_AMBULATORY_CARE_PROVIDER_SITE_OTHER): Payer: Medicare PPO

## 2022-10-20 DIAGNOSIS — E7800 Pure hypercholesterolemia, unspecified: Secondary | ICD-10-CM | POA: Diagnosis not present

## 2022-10-20 DIAGNOSIS — E1165 Type 2 diabetes mellitus with hyperglycemia: Secondary | ICD-10-CM

## 2022-10-20 LAB — BASIC METABOLIC PANEL
BUN: 11 mg/dL (ref 6–23)
CO2: 29 mEq/L (ref 19–32)
Calcium: 9.6 mg/dL (ref 8.4–10.5)
Chloride: 105 mEq/L (ref 96–112)
Creatinine, Ser: 0.91 mg/dL (ref 0.40–1.20)
GFR: 60.47 mL/min (ref >60.00)
Glucose, Bld: 112 mg/dL — ABNORMAL HIGH (ref 70–99)
Potassium: 3.8 mEq/L (ref 3.5–5.1)
Sodium: 139 mEq/L (ref 135–145)

## 2022-10-20 LAB — CBC WITH DIFFERENTIAL/PLATELET
Basophils Absolute: 0.1 10*3/uL (ref 0.0–0.1)
Basophils Relative: 0.9 % (ref 0.0–3.0)
Eosinophils Absolute: 0.1 10*3/uL (ref 0.0–0.7)
Eosinophils Relative: 1.9 % (ref 0.0–5.0)
HCT: 38.3 % (ref 36.0–46.0)
Hemoglobin: 12.6 g/dL (ref 12.0–15.0)
Lymphocytes Relative: 39.6 % (ref 12.0–46.0)
Lymphs Abs: 2.7 10*3/uL (ref 0.7–4.0)
MCHC: 33 g/dL (ref 30.0–36.0)
MCV: 93.1 fl (ref 78.0–100.0)
Monocytes Absolute: 0.8 10*3/uL (ref 0.1–1.0)
Monocytes Relative: 11.3 % (ref 3.0–12.0)
Neutro Abs: 3.2 10*3/uL (ref 1.4–7.7)
Neutrophils Relative %: 46.3 % (ref 43.0–77.0)
Platelets: 328 10*3/uL (ref 150.0–400.0)
RBC: 4.11 Mil/uL (ref 3.87–5.11)
RDW: 14.2 % (ref 11.5–15.5)
WBC: 6.9 10*3/uL (ref 4.0–10.5)

## 2022-10-20 LAB — LIPID PANEL
Cholesterol: 187 mg/dL (ref 0–200)
HDL: 58.3 mg/dL (ref >39.00)
LDL Cholesterol: 107 mg/dL — ABNORMAL HIGH (ref 0–99)
NonHDL: 128.23
Total CHOL/HDL Ratio: 3
Triglycerides: 105 mg/dL (ref 0.0–149.0)
VLDL: 21 mg/dL (ref 0.0–40.0)

## 2022-10-20 LAB — HEMOGLOBIN A1C: Hgb A1c MFr Bld: 6.3 % (ref 4.6–6.5)

## 2022-10-20 LAB — HEPATIC FUNCTION PANEL
ALT: 12 U/L (ref 0–35)
AST: 17 U/L (ref 0–37)
Albumin: 3.9 g/dL (ref 3.5–5.2)
Alkaline Phosphatase: 73 U/L (ref 39–117)
Bilirubin, Direct: 0.1 mg/dL (ref 0.0–0.3)
Total Bilirubin: 0.6 mg/dL (ref 0.2–1.2)
Total Protein: 6.9 g/dL (ref 6.0–8.3)

## 2022-10-20 LAB — TSH: TSH: 1.49 u[IU]/mL (ref 0.35–5.50)

## 2022-10-23 ENCOUNTER — Other Ambulatory Visit: Payer: Medicare PPO

## 2022-10-25 ENCOUNTER — Encounter: Payer: Self-pay | Admitting: Internal Medicine

## 2022-10-25 ENCOUNTER — Ambulatory Visit: Payer: Medicare PPO | Admitting: Internal Medicine

## 2022-10-25 VITALS — BP 130/78 | HR 74 | Temp 98.3°F | Resp 15 | Ht 63.0 in | Wt 183.1 lb

## 2022-10-25 DIAGNOSIS — D649 Anemia, unspecified: Secondary | ICD-10-CM | POA: Diagnosis not present

## 2022-10-25 DIAGNOSIS — E7800 Pure hypercholesterolemia, unspecified: Secondary | ICD-10-CM | POA: Diagnosis not present

## 2022-10-25 DIAGNOSIS — I10 Essential (primary) hypertension: Secondary | ICD-10-CM

## 2022-10-25 DIAGNOSIS — J4520 Mild intermittent asthma, uncomplicated: Secondary | ICD-10-CM | POA: Diagnosis not present

## 2022-10-25 DIAGNOSIS — E1165 Type 2 diabetes mellitus with hyperglycemia: Secondary | ICD-10-CM | POA: Diagnosis not present

## 2022-10-25 DIAGNOSIS — K219 Gastro-esophageal reflux disease without esophagitis: Secondary | ICD-10-CM

## 2022-10-25 NOTE — Progress Notes (Signed)
Patient ID: Lisa Robles, female   DOB: 1946/06/14, 76 y.o.   MRN: [id number]   Subjective:    Patient ID: Lisa Robles, female    DOB: 12/17/1945, 76 y.o.   MRN: [id number]   Patient here for  Chief Complaint  Patient presents with   Follow-up   Diabetes   .   HPI Here to follow up regarding hypercholesterolemia, diabetes and hypertension.  She has been exercising.  Doing yoga and heart healthy exercise.  No chest pain or sob reported.  No cough or congestion.  No acid reflux.  No abdominal pain or bowel change reported.  Had skin assessment - Dr Kellie Moor.    Past Medical History:  Diagnosis Date   Diabetes mellitus without complication (HCC)    GERD (gastroesophageal reflux disease)    Hypercholesteremia    Hypertension    Past Surgical History:  Procedure Laterality Date   BREAST BIOPSY     TOOTH EXTRACTION     tooth implantation   TUBAL LIGATION  1979   Family History  Problem Relation Age of Onset   Heart attack Mother    Heart attack Father    Hypercholesterolemia Brother    Diabetes Brother    Cancer Maternal Aunt        breast   Diabetes Maternal Grandmother    Schizophrenia Grandson    Colon cancer Neg Hx    Breast cancer Neg Hx    Social History   Socioeconomic History   Marital status: Widowed    Spouse name: Not on file   Number of children: 2   Years of education: masters   Highest education level: Not on file  Occupational History   Occupation: retired Pharmacist, hospital  Tobacco Use   Smoking status: Former    Packs/day: 1.00    Years: 7.00    Total pack years: 7.00    Types: Cigarettes    Quit date: 12/04/1977    Years since quitting: 44.9   Smokeless tobacco: Never  Vaping Use   Vaping Use: Never used  Substance and Sexual Activity   Alcohol use: No    Alcohol/week: 0.0 standard drinks of alcohol   Drug use: No   Sexual activity: Yes    Birth control/protection: None    Comment: Is sexually active wih same partner x 14 years. Recently  found out partner has been cheating.  Other Topics Concern   Not on file  Social History Narrative   Not on file   Social Determinants of Health   Financial Resource Strain: Low Risk  (03/15/2022)   Overall Financial Resource Strain (CARDIA)    Difficulty of Paying Living Expenses: Not hard at all  Food Insecurity: No Food Insecurity (03/15/2022)   Hunger Vital Sign    Worried About Running Out of Food in the Last Year: Never true    Ran Out of Food in the Last Year: Never true  Transportation Needs: No Transportation Needs (03/15/2022)   PRAPARE - Hydrologist (Medical): No    Lack of Transportation (Non-Medical): No  Physical Activity: Insufficiently Active (03/15/2022)   Exercise Vital Sign    Days of Exercise per Week: 7 days    Minutes of Exercise per Session: 20 min  Stress: No Stress Concern Present (03/15/2022)   [place]    Feeling of Stress : Not at all  Social Connections: Unknown (03/15/2022)   Social Connection and Isolation Panel [NHANES]  Frequency of Communication with Friends and Family: More than three times a week    Frequency of Social Gatherings with Friends and Family: More than three times a week    Attends Religious Services: Not on file    Active Member of Clubs or Organizations: Yes    Attends Archivist Meetings: Not on file    Marital Status: Widowed     Review of Systems  Constitutional:  Negative for appetite change and unexpected weight change.  HENT:  Negative for congestion and sinus pressure.   Respiratory:  Negative for cough, chest tightness and shortness of breath.   Cardiovascular:  Negative for chest pain, palpitations and leg swelling.  Gastrointestinal:  Negative for abdominal pain, diarrhea, nausea and vomiting.  Genitourinary:  Negative for difficulty urinating and dysuria.  Musculoskeletal:  Negative for joint swelling and  myalgias.  Skin:  Negative for color change and rash.  Neurological:  Negative for dizziness and headaches.  Psychiatric/Behavioral:  Negative for agitation and dysphoric mood.        Objective:     BP 130/78 (BP Location: Left Arm, Patient Position: Sitting, Cuff Size: Small)   Pulse 74   Temp 98.3 F (36.8 C) (Temporal)   Resp 15   Ht _0  (1.6 m)   Wt 183 lb 1 oz (83 kg)   SpO2 96%   BMI 32.43 kg/m  Wt Readings from Last 3 Encounters:  10/25/22 183 lb 1 oz (83 kg)  07/25/22 183 lb 3.2 oz (83.1 kg)  03/15/22 180 lb (81.6 kg)    Physical Exam Vitals reviewed.  Constitutional:      General: She is not in acute distress.    Appearance: Normal appearance.  HENT:     Head: Normocephalic and atraumatic.     Right Ear: External ear normal.     Left Ear: External ear normal.  Eyes:     General: No scleral icterus.       Right eye: No discharge.        Left eye: No discharge.     Conjunctiva/sclera: Conjunctivae normal.  Neck:     Thyroid: No thyromegaly.  Cardiovascular:     Rate and Rhythm: Normal rate and regular rhythm.  Pulmonary:     Effort: No respiratory distress.     Breath sounds: Normal breath sounds. No wheezing.  Abdominal:     General: Bowel sounds are normal.     Palpations: Abdomen is soft.     Tenderness: There is no abdominal tenderness.  Musculoskeletal:        General: No swelling or tenderness.     Cervical back: Neck supple. No tenderness.  Lymphadenopathy:     Cervical: No cervical adenopathy.  Skin:    Findings: No erythema or rash.  Neurological:     Mental Status: She is alert.  Psychiatric:        Mood and Affect: Mood normal.        Behavior: Behavior normal.      Outpatient Encounter Medications as of 10/25/2022  Medication Sig   albuterol (VENTOLIN HFA) 108 (90 Base) MCG/ACT inhaler Inhale 2 puffs into the lungs every 6 (six) hours as needed.   azelastine (ASTELIN) 0.1 % nasal spray Place 1 spray into both nostrils in the  morning and at bedtime.   docusate sodium (COLACE) 100 MG capsule Take 1 capsule (100 mg total) by mouth every 12 (twelve) hours.   fluticasone (FLONASE) 50 MCG/ACT nasal spray Place 2  sprays into both nostrils daily.   guaiFENesin (MUCINEX) 600 MG 12 hr tablet Take 2 tablets (1,200 mg total) by mouth 2 (two) times daily as needed for cough or to loosen phlegm.   hydrochlorothiazide (HYDRODIURIL) 25 MG tablet TAKE 1 TABLET(25 MG) BY MOUTH DAILY   meclizine (ANTIVERT) 25 MG tablet Take 1 tablet (25 mg total) by mouth 3 (three) times daily as needed for dizziness.   nystatin cream (MYCOSTATIN) Apply 1 application topically 2 (two) times daily.   pantoprazole (PROTONIX) 40 MG tablet TAKE 1 TABLET(40 MG) BY MOUTH DAILY   pravastatin (PRAVACHOL) 40 MG tablet Take 1 tablet (40 mg total) by mouth daily.   Probiotic Product (ALIGN PO) Take by mouth.   No facility-administered encounter medications on file as of 10/25/2022.     Lab Results  Component Value Date   WBC 6.9 10/20/2022   HGB 12.6 10/20/2022   HCT 38.3 10/20/2022   PLT 328.0 10/20/2022   GLUCOSE 112 (H) 10/20/2022   CHOL 187 10/20/2022   TRIG 105.0 10/20/2022   HDL 58.30 10/20/2022   LDLDIRECT 142.7 12/11/2013   LDLCALC 107 (H) 10/20/2022   ALT 12 10/20/2022   AST 17 10/20/2022   NA 139 10/20/2022   K 3.8 10/20/2022   CL 105 10/20/2022   CREATININE 0.91 10/20/2022   BUN 11 10/20/2022   CO2 29 10/20/2022   TSH 1.49 10/20/2022   INR 0.9 11/25/2021   HGBA1C 6.3 10/20/2022   MICROALBUR 0.9 02/02/2022    US Venous Img Upper Uni Left (DVT)  Result Date: 02/02/2022 CLINICAL DATA:  76 year old female with left upper extremity swelling. EXAM: LEFT UPPER EXTREMITY VENOUS DOPPLER ULTRASOUND TECHNIQUE: Gray-scale sonography with graded compression, as well as color Doppler and duplex ultrasound were performed to evaluate the upper extremity deep venous system from the level of the subclavian vein and including the jugular, axillary,  basilic, radial, ulnar and upper cephalic vein. Spectral Doppler was utilized to evaluate flow at rest and with distal augmentation maneuvers. COMPARISON:  None. FINDINGS: Contralateral Subclavian Vein: Respiratory phasicity is normal and symmetric with the symptomatic side. No evidence of thrombus. Normal compressibility. Internal Jugular Vein: No evidence of thrombus. Normal compressibility, respiratory phasicity and response to augmentation. Subclavian Vein: No evidence of thrombus. Normal compressibility, respiratory phasicity and response to augmentation. Axillary Vein: No evidence of thrombus. Normal compressibility, respiratory phasicity and response to augmentation. Cephalic Vein: No evidence of thrombus. Normal compressibility, respiratory phasicity and response to augmentation. Basilic Vein: No evidence of thrombus. Normal compressibility, respiratory phasicity and response to augmentation. Brachial Veins: No evidence of thrombus. Normal compressibility, respiratory phasicity and response to augmentation. Radial Veins: No evidence of thrombus. Normal compressibility, respiratory phasicity and response to augmentation. Ulnar Veins: No evidence of thrombus. Normal compressibility, respiratory phasicity and response to augmentation. Other Findings:  None visualized. IMPRESSION: No evidence left upper extremity deep vein thrombosis. Ruthann Cancer, MD [facility] Inter[facility] Radiology Specialists [facility] Radiology Electronically Signed   By: Ruthann Cancer M.D.   On: 02/02/2022 16:21       Assessment & Plan:   Problem List Items Addressed This Visit     Anemia - Primary    Follow cbc.       Asthma    Breathing stable.  Has rescue inhaler if needed.        GERD (gastroesophageal reflux disease)    Continue protonix.  Follow.       Hypercholesterolemia    The 10-year ASCVD risk score (Arnett  DK, et al., 2019) is: 36.8%   Values used to calculate the score:     Age: 70 years     Sex:  Female     Is Non-Hispanic African American: Yes     Diabetic: Yes     Tobacco smoker: No     Systolic Blood Pressure: 242 mmHg     Is BP treated: Yes     HDL Cholesterol: 58.3 mg/dL     Total Cholesterol: 187 mg/dL  Discussed labs.  On pravastatin  Follow lipid panel and liver function tests.       Relevant Orders   Hepatic function panel   Lipid panel   Hypertension    Continue hctz.  Blood pressure as outlined. Follow pressures.  Follow metabolic panel.       Relevant Orders   Basic metabolic panel   Type 2 diabetes mellitus with hyperglycemia (HCC)    Low carb diet and exercise given elevated blood sugars. Follow met b and a1c.  Lab Results  Component Value Date   HGBA1C 6.3 10/20/2022       Relevant Orders   Hemoglobin A1c     Einar Pheasant, MD

## 2022-10-27 ENCOUNTER — Encounter: Payer: Self-pay | Admitting: Internal Medicine

## 2022-10-28 NOTE — Assessment & Plan Note (Signed)
The 10-year ASCVD risk score (Arnett DK, et al., 2019) is: 36.8%   Values used to calculate the score:     Age: 76 years     Sex: Female     Is Non-Hispanic African American: Yes     Diabetic: Yes     Tobacco smoker: No     Systolic Blood Pressure: 130 mmHg     Is BP treated: Yes     HDL Cholesterol: 58.3 mg/dL     Total Cholesterol: 187 mg/dL  Discussed labs.  On pravastatin  Follow lipid panel and liver function tests.

## 2022-10-28 NOTE — Assessment & Plan Note (Signed)
Low carb diet and exercise given elevated blood sugars. Follow met b and a1c.  Lab Results  Component Value Date   HGBA1C 6.3 10/20/2022

## 2022-10-28 NOTE — Assessment & Plan Note (Signed)
Continue hctz.  Blood pressure as outlined.  Follow pressures.  Follow metabolic panel.  

## 2022-10-28 NOTE — Assessment & Plan Note (Signed)
Follow cbc.  

## 2022-10-28 NOTE — Assessment & Plan Note (Signed)
Continue protonix.  Follow.   

## 2022-10-28 NOTE — Assessment & Plan Note (Signed)
Breathing stable.  Has rescue inhaler if needed.   

## 2022-11-01 ENCOUNTER — Other Ambulatory Visit: Payer: Self-pay | Admitting: Internal Medicine

## 2022-11-20 DIAGNOSIS — H25013 Cortical age-related cataract, bilateral: Secondary | ICD-10-CM | POA: Diagnosis not present

## 2022-12-01 DIAGNOSIS — R0981 Nasal congestion: Secondary | ICD-10-CM | POA: Diagnosis not present

## 2022-12-01 DIAGNOSIS — Z20822 Contact with and (suspected) exposure to covid-19: Secondary | ICD-10-CM | POA: Diagnosis not present

## 2022-12-02 DIAGNOSIS — R0981 Nasal congestion: Secondary | ICD-10-CM | POA: Diagnosis not present

## 2022-12-02 DIAGNOSIS — Z20822 Contact with and (suspected) exposure to covid-19: Secondary | ICD-10-CM | POA: Diagnosis not present

## 2022-12-16 DIAGNOSIS — G4733 Obstructive sleep apnea (adult) (pediatric): Secondary | ICD-10-CM | POA: Diagnosis not present

## 2022-12-19 ENCOUNTER — Ambulatory Visit: Payer: Medicare PPO

## 2022-12-20 ENCOUNTER — Ambulatory Visit
Admission: EM | Admit: 2022-12-20 | Discharge: 2022-12-20 | Disposition: A | Discharge status: Home / Self Care | Payer: Medicare PPO | Attending: Nurse Practitioner | Admitting: Nurse Practitioner

## 2022-12-20 DIAGNOSIS — J4520 Mild intermittent asthma, uncomplicated: Secondary | ICD-10-CM

## 2022-12-20 DIAGNOSIS — H8111 Benign paroxysmal vertigo, right ear: Secondary | ICD-10-CM | POA: Diagnosis not present

## 2022-12-20 DIAGNOSIS — R8290 Unspecified abnormal findings in urine: Secondary | ICD-10-CM | POA: Diagnosis not present

## 2022-12-20 LAB — URINALYSIS, ROUTINE W REFLEX MICROSCOPIC
Bilirubin Urine: NEGATIVE
Glucose, UA: NEGATIVE mg/dL
Hgb urine dipstick: NEGATIVE
Ketones, ur: NEGATIVE mg/dL
Leukocytes,Ua: NEGATIVE
Nitrite: NEGATIVE
Protein, ur: NEGATIVE mg/dL
Specific Gravity, Urine: 1.01 (ref 1.005–1.030)
pH: 6 (ref 5.0–8.0)

## 2022-12-20 MED ORDER — ALBUTEROL SULFATE HFA 108 (90 BASE) MCG/ACT IN AERS: 1.0000 | INHALATION_SPRAY | Freq: Four times a day (QID) | RESPIRATORY_TRACT | 0 refills | Status: DC | PRN

## 2022-12-20 NOTE — ED Provider Notes (Signed)
[facility]    CSN: [id number] Arrival date & time: 12/20/22  1312      History   Chief Complaint Chief Complaint  Patient presents with   Dizziness    HPI Lisa Robles is a 77 y.o. female for evaluation of multiple complaints.  Patient reports 2 days ago she went from a sitting to standing position and had a sharp pain in the center of her chest with onset of dizziness that she describes as the room was spinning.  This lasted for 2 minutes and resolved and has not returned.  During that time she denied any headache, visual changes, shortness of breath, syncope. She has a hx of vertigo and states this episode was typical for her vertigo presentation.  No additional episodes of chest pain as well.  She states she has a history of chronic sinus/ear congestion on the right side and has frequent postnasal drip.  She does occasionally have crackling/popping in her right ear.  No ear pain.  Last night before going to bed she had sudden onset of shortness of breath.  She does have a history of asthma but is currently out of all of her rescue inhaler.  She was able to sleep with her CPAP without issue. She denies any inciting event prior to onset of the shortness of breath.  This morning awoke with no additional episodes shortness of breath and she went to a work out class without issue.  In addition she reports that her urine has smelled "funny" for the past 2 weeks.  Denies any dysuria, urgency, frequency, hematuria, fevers, nausea/vomiting, flank pain.  Has a history of frequent UTIs but states this does not feel typical for her presentation.  She states yesterday she took meclizine even though she was not dizzy and has been using nasal sprays for her postnasal drip.  No other concerns at this time.   Dizziness Associated symptoms: shortness of breath     Past Medical History:  Diagnosis Date   Diabetes mellitus without complication ([place])    GERD (gastroesophageal reflux disease)     Hypercholesteremia    Hypertension     Patient Active Problem List   Diagnosis Date Noted   Left upper extremity swelling 02/02/2022   Low back pain 09/29/2021   Syncope 06/06/2021   OSA (obstructive sleep apnea) 11/08/2020   Healthcare maintenance 11/08/2020   Fatigue 02/01/2020   Rash 07/06/2019   Chest pain 04/29/2019   Cough 04/29/2019   Asthma 04/29/2019   Frequent urinary tract infections 01/05/2019   UTI (urinary tract infection) 08/22/2017   Health care maintenance 04/20/2015   Abnormal Pap smear of cervix 05/19/2014   Numbness and tingling in left hand 12/21/2013   GERD (gastroesophageal reflux disease) 11/14/2012   Hypertension 11/14/2012   Hypercholesterolemia 11/14/2012   Type 2 diabetes mellitus with hyperglycemia ([place]) 11/14/2012   Anemia 11/14/2012   B12 deficiency 11/14/2012    Past Surgical History:  Procedure Laterality Date   BREAST BIOPSY     TOOTH EXTRACTION     tooth implantation   TUBAL LIGATION  1979    OB History     Gravida  3   Para  3   Term      Preterm      AB      Living         SAB      IAB      Ectopic      Multiple  Live Births               Home Medications    Prior to Admission medications   Medication Sig Start Date End Date Taking? Authorizing Provider  albuterol (VENTOLIN HFA) 108 (90 Base) MCG/ACT inhaler Inhale 1-2 puffs into the lungs every 6 (six) hours as needed for wheezing or shortness of breath. 12/20/22  Yes Radford Pax, NP  azelastine (ASTELIN) 0.1 % nasal spray Place 1 spray into both nostrils in the morning and at bedtime. 07/25/22  Yes Dale Glenolden, MD  docusate sodium (COLACE) 100 MG capsule Take 1 capsule (100 mg total) by mouth every 12 (twelve) hours. 03/01/20  Yes Wallis Bamberg, PA-C  fluticasone (FLONASE) 50 MCG/ACT nasal spray Place 2 sprays into both nostrils daily. 04/29/19  Yes Dale [place], MD  guaiFENesin (MUCINEX) 600 MG 12 hr tablet Take 2 tablets (1,200 mg total)  by mouth 2 (two) times daily as needed for cough or to loosen phlegm. 04/08/19  Yes Guse, Janna Arch, FNP  hydrochlorothiazide (HYDRODIURIL) 25 MG tablet TAKE 1 TABLET EVERY DAY 11/01/22  Yes Dale [place], MD  meclizine (ANTIVERT) 25 MG tablet Take 1 tablet (25 mg total) by mouth 3 (three) times daily as needed for dizziness. 12/02/18  Yes Cook, Jayce G, DO  nystatin cream (MYCOSTATIN) Apply 1 application topically 2 (two) times daily. 07/03/19  Yes Dale [place], MD  pantoprazole (PROTONIX) 40 MG tablet TAKE 1 TABLET(40 MG) BY MOUTH DAILY 02/26/20  Yes Dale Nicholson, MD  pravastatin (PRAVACHOL) 40 MG tablet Take 1 tablet (40 mg total) by mouth daily. 07/25/22  Yes Dale Astor, MD  Probiotic Product (ALIGN PO) Take by mouth.   Yes [provider]    Family History Family History  Problem Relation Age of Onset   Heart attack Mother    Heart attack Father    Hypercholesterolemia Brother    Diabetes Brother    Cancer Maternal Aunt        breast   Diabetes Maternal Grandmother    Schizophrenia Grandson    Colon cancer Neg Hx    Breast cancer Neg Hx     Social History Social History   Tobacco Use   Smoking status: Former    Packs/day: 1.00    Years: 7.00    Total pack years: 7.00    Types: Cigarettes    Quit date: 12/04/1977    Years since quitting: 45.0   Smokeless tobacco: Never  Vaping Use   Vaping Use: Never used  Substance Use Topics   Alcohol use: No    Alcohol/week: 0.0 standard drinks of alcohol   Drug use: No     Allergies   Cinnamon, Bactrim [sulfamethoxazole-trimethoprim], and Shellfish allergy   Review of Systems Review of Systems  Respiratory:  Positive for shortness of breath.   Genitourinary:        Smelly urine  Neurological:  Positive for dizziness.     Physical Exam Triage Vital Signs ED Triage Vitals  Enc Vitals Group     BP 12/20/22 1334 (!) 143/81     Pulse Rate 12/20/22 1334 80     Resp 12/20/22 1334 16     Temp 12/20/22  1334 98.4 F (36.9 C)     Temp Source 12/20/22 1334 Oral     SpO2 12/20/22 1334 96 %     Weight 12/20/22 1332 180 lb (81.6 kg)     Height 12/20/22 1332 5\' 3"  (1.6 m)  Head Circumference --      Peak Flow --      Pain Score 12/20/22 1332 0     Pain Loc --      Pain Edu? --      Excl. in [place]? --    No data found.  Updated Vital Signs BP 134/85 (BP Location: Left Arm)   Pulse 80   Temp 98.4 F (36.9 C) (Oral)   Resp 16   Ht 5\' 3"  (1.6 m)   Wt 180 lb (81.6 kg)   SpO2 94%   BMI 31.89 kg/m   Visual Acuity Right Eye Distance:   Left Eye Distance:   Bilateral Distance:    Right Eye Near:   Left Eye Near:    Bilateral Near:     Physical Exam Vitals and nursing note reviewed.  Constitutional:      General: She is not in acute distress.    Appearance: Normal appearance. She is not ill-appearing.  HENT:     Head: Normocephalic and atraumatic.     Right Ear: Ear canal normal. A middle ear effusion is present. Tympanic membrane is not erythematous or bulging.     Left Ear: Tympanic membrane and ear canal normal.     Nose: Nose normal.     Mouth/Throat:     Mouth: Mucous membranes are moist.     Pharynx: No oropharyngeal exudate or posterior oropharyngeal erythema.  Eyes:     Pupils: Pupils are equal, round, and reactive to light.  Cardiovascular:     Rate and Rhythm: Normal rate and regular rhythm.     Heart sounds: Normal heart sounds.  Pulmonary:     Effort: Pulmonary effort is normal.     Breath sounds: No wheezing, rhonchi or rales.  Skin:    General: Skin is warm and dry.  Neurological:     General: No focal deficit present.     Mental Status: She is alert and oriented to person, place, and time.  Psychiatric:        Mood and Affect: Mood normal.        Behavior: Behavior normal.      UC Treatments / Results  Labs (all labs ordered are listed, but only abnormal results are displayed) Labs Reviewed  URINE CULTURE  URINALYSIS, ROUTINE W REFLEX  MICROSCOPIC    EKG   Radiology No results found.  Procedures Procedures (including critical care time)  Medications Ordered in UC Medications - No data to display  Initial Impression / Assessment and Plan / UC Course  I have reviewed the triage vital signs and the nursing notes.  Pertinent labs & imaging results that were available during my care of the patient were reviewed by me and considered in my medical decision making (see chart for details).    Reviewed exam and symptoms with patient.  No red flags on exam. Chart review indicates patient is only on albuterol inhaler, refilled for her. Lungs CTA on exam  UA negative for infection, will culture out of caution  Discussed vertigo. Pt with no additional episodes for 2 days and has been exercising without CP, SOB, or dizziness. Do not feel additional work up is needed at this time. Advised to continue her flonase and take meclizine for vertigo symptoms as needed  Encouraged rest and hydration  Patient to follow-up with PCP 2 days for recheck ER precautions reviewed and patient verbalized understanding Final Clinical Impressions(s) / UC Diagnoses   Final diagnoses:  Mild intermittent  asthma without complication  Benign paroxysmal positional vertigo of right ear  Foul smelling urine     Discharge Instructions      Meclizine as needed for additional vertigo symptoms Albuterol inhaler as needed for shortness of breath Please follow-up with your PCP in 2 days for recheck Please go to emergency room for any worsening symptoms     ED Prescriptions     Medication Sig Dispense Auth. Provider   albuterol (VENTOLIN HFA) 108 (90 Base) MCG/ACT inhaler Inhale 1-2 puffs into the lungs every 6 (six) hours as needed for wheezing or shortness of breath. 1 each Radford Pax, NP      PDMP not reviewed this encounter.   Radford Pax, NP 12/20/22 1410

## 2022-12-20 NOTE — Discharge Instructions (Signed)
Meclizine as needed for additional vertigo symptoms Albuterol inhaler as needed for shortness of breath Please follow-up with your PCP in 2 days for recheck Please go to emergency room for any worsening symptoms

## 2022-12-20 NOTE — ED Triage Notes (Signed)
Pt c/o dizziness x2 days, states she got out of bed & room felt like it was spinning. Also states she had a sharp pain in her chest Monday morning no recent episodes since.

## 2022-12-22 ENCOUNTER — Telehealth (HOSPITAL_COMMUNITY): Payer: Self-pay | Admitting: Emergency Medicine

## 2022-12-22 ENCOUNTER — Ambulatory Visit
Admission: RE | Admit: 2022-12-22 | Discharge: 2022-12-22 | Disposition: A | Discharge status: Home / Self Care | Payer: Medicare PPO | Source: Ambulatory Visit | Attending: Internal Medicine | Admitting: Internal Medicine

## 2022-12-22 DIAGNOSIS — Z1231 Encounter for screening mammogram for malignant neoplasm of breast: Secondary | ICD-10-CM | POA: Diagnosis not present

## 2022-12-22 LAB — URINE CULTURE: Culture: >=100000 — AB

## 2022-12-22 MED ORDER — NITROFURANTOIN MONOHYD MACRO 100 MG PO CAPS: 100.0000 mg | ORAL_CAPSULE | Freq: Two times a day (BID) | ORAL | 0 refills | Status: DC

## 2022-12-26 ENCOUNTER — Encounter: Payer: Self-pay | Admitting: Internal Medicine

## 2023-01-02 DIAGNOSIS — R0981 Nasal congestion: Secondary | ICD-10-CM | POA: Diagnosis not present

## 2023-01-02 DIAGNOSIS — Z20822 Contact with and (suspected) exposure to covid-19: Secondary | ICD-10-CM | POA: Diagnosis not present

## 2023-01-03 DIAGNOSIS — Z20822 Contact with and (suspected) exposure to covid-19: Secondary | ICD-10-CM | POA: Diagnosis not present

## 2023-01-03 DIAGNOSIS — R0981 Nasal congestion: Secondary | ICD-10-CM | POA: Diagnosis not present

## 2023-01-04 DIAGNOSIS — Z20822 Contact with and (suspected) exposure to covid-19: Secondary | ICD-10-CM | POA: Diagnosis not present

## 2023-01-04 DIAGNOSIS — R0981 Nasal congestion: Secondary | ICD-10-CM | POA: Diagnosis not present

## 2023-01-07 DIAGNOSIS — R0981 Nasal congestion: Secondary | ICD-10-CM | POA: Diagnosis not present

## 2023-01-07 DIAGNOSIS — Z20822 Contact with and (suspected) exposure to covid-19: Secondary | ICD-10-CM | POA: Diagnosis not present

## 2023-01-10 DIAGNOSIS — Z20822 Contact with and (suspected) exposure to covid-19: Secondary | ICD-10-CM | POA: Diagnosis not present

## 2023-01-10 DIAGNOSIS — R0981 Nasal congestion: Secondary | ICD-10-CM | POA: Diagnosis not present

## 2023-01-12 DIAGNOSIS — Z20822 Contact with and (suspected) exposure to covid-19: Secondary | ICD-10-CM | POA: Diagnosis not present

## 2023-01-12 DIAGNOSIS — R0981 Nasal congestion: Secondary | ICD-10-CM | POA: Diagnosis not present

## 2023-01-14 DIAGNOSIS — G4733 Obstructive sleep apnea (adult) (pediatric): Secondary | ICD-10-CM | POA: Diagnosis not present

## 2023-01-14 DIAGNOSIS — R0981 Nasal congestion: Secondary | ICD-10-CM | POA: Diagnosis not present

## 2023-01-14 DIAGNOSIS — Z20822 Contact with and (suspected) exposure to covid-19: Secondary | ICD-10-CM | POA: Diagnosis not present

## 2023-01-26 ENCOUNTER — Ambulatory Visit: Payer: Medicare PPO | Admitting: Internal Medicine

## 2023-02-27 ENCOUNTER — Ambulatory Visit: Payer: Medicare PPO | Admitting: Internal Medicine

## 2023-02-27 ENCOUNTER — Encounter: Payer: Self-pay | Admitting: Internal Medicine

## 2023-02-27 VITALS — BP 128/70 | HR 70 | Temp 98.0°F | Resp 16 | Ht 63.0 in | Wt 185.6 lb

## 2023-02-27 DIAGNOSIS — R059 Cough, unspecified: Secondary | ICD-10-CM

## 2023-02-27 DIAGNOSIS — K219 Gastro-esophageal reflux disease without esophagitis: Secondary | ICD-10-CM | POA: Diagnosis not present

## 2023-02-27 DIAGNOSIS — I10 Essential (primary) hypertension: Secondary | ICD-10-CM

## 2023-02-27 DIAGNOSIS — E1165 Type 2 diabetes mellitus with hyperglycemia: Secondary | ICD-10-CM | POA: Diagnosis not present

## 2023-02-27 DIAGNOSIS — E7800 Pure hypercholesterolemia, unspecified: Secondary | ICD-10-CM | POA: Diagnosis not present

## 2023-02-27 DIAGNOSIS — G4733 Obstructive sleep apnea (adult) (pediatric): Secondary | ICD-10-CM | POA: Diagnosis not present

## 2023-02-27 DIAGNOSIS — J4520 Mild intermittent asthma, uncomplicated: Secondary | ICD-10-CM | POA: Diagnosis not present

## 2023-02-27 DIAGNOSIS — D649 Anemia, unspecified: Secondary | ICD-10-CM

## 2023-02-27 MED ORDER — CEFDINIR 300 MG PO CAPS: 300.0000 mg | ORAL_CAPSULE | Freq: Two times a day (BID) | ORAL | 0 refills | Status: DC

## 2023-02-27 NOTE — Progress Notes (Signed)
Subjective:    Patient ID: Lisa Robles, female    DOB: Mar 04, 1946, 77 y.o.   MRN: [id number]  Patient here for  Chief Complaint  Patient presents with   Medical Management of Chronic Issues    HPI Here to follow up regarding hypercholesterolemia, diabetes and hypertension. She is continuing to exercise and yoga.  Is also substitute teaching.  Trying to stay active.  No chest pain or sob reported.  No abdominal pain reported.  Some constipation - when travels to her daughters.  Discussed benefiber.  No constipation now.  Does report right side facial pressure and some discomfort - angle of jaw.  Sore throat.  Some increased chest congestion and cough.  Thick mucus.  Taking mucnex.    Past Medical History:  Diagnosis Date   Diabetes mellitus without complication (HCC)    GERD (gastroesophageal reflux disease)    Hypercholesteremia    Hypertension    Past Surgical History:  Procedure Laterality Date   BREAST BIOPSY     TOOTH EXTRACTION     tooth implantation   TUBAL LIGATION  1979   Family History  Problem Relation Age of Onset   Heart attack Mother    Heart attack Father    Hypercholesterolemia Brother    Diabetes Brother    Cancer Maternal Aunt        breast   Diabetes Maternal Grandmother    Schizophrenia Grandson    Colon cancer Neg Hx    Breast cancer Neg Hx    Social History   Socioeconomic History   Marital status: Widowed    Spouse name: Not on file   Number of children: 2   Years of education: masters   Highest education level: Not on file  Occupational History   Occupation: retired Pharmacist, hospital  Tobacco Use   Smoking status: Former    Packs/day: 1.00    Years: 7.00    Additional pack years: 0.00    Total pack years: 7.00    Types: Cigarettes    Quit date: 12/04/1977    Years since quitting: 45.2   Smokeless tobacco: Never  Vaping Use   Vaping Use: Never used  Substance and Sexual Activity   Alcohol use: No    Alcohol/week: 0.0 standard drinks of  alcohol   Drug use: No   Sexual activity: Yes    Birth control/protection: None    Comment: Is sexually active wih same partner x 14 years. Recently found out partner has been cheating.  Other Topics Concern   Not on file  Social History Narrative   Not on file   Social Determinants of Health   Financial Resource Strain: Low Risk  (03/15/2022)   Overall Financial Resource Strain (CARDIA)    Difficulty of Paying Living Expenses: Not hard at all  Food Insecurity: No Food Insecurity (03/15/2022)   Hunger Vital Sign    Worried About Running Out of Food in the Last Year: Never true    Ran Out of Food in the Last Year: Never true  Transportation Needs: No Transportation Needs (03/15/2022)   PRAPARE - Hydrologist (Medical): No    Lack of Transportation (Non-Medical): No  Physical Activity: Insufficiently Active (03/15/2022)   Exercise Vital Sign    Days of Exercise per Week: 7 days    Minutes of Exercise per Session: 20 min  Stress: No Stress Concern Present (03/15/2022)   [place]  Feeling of Stress : Not at all  Social Connections: Unknown (03/15/2022)   Social Connection and Isolation Panel [NHANES]    Frequency of Communication with Friends and Family: More than three times a week    Frequency of Social Gatherings with Friends and Family: More than three times a week    Attends Religious Services: Not on file    Active Member of Clubs or Organizations: Yes    Attends Archivist Meetings: Not on file    Marital Status: Widowed     Review of Systems  Constitutional:  Negative for appetite change and fever.  HENT:  Positive for congestion, postnasal drip and sinus pressure.   Respiratory:  Positive for cough. Negative for chest tightness and shortness of breath.   Cardiovascular:  Negative for chest pain and palpitations.  Gastrointestinal:  Negative for abdominal pain,  diarrhea, nausea and vomiting.  Genitourinary:  Negative for difficulty urinating and dysuria.  Musculoskeletal:  Negative for joint swelling and myalgias.  Skin:  Negative for color change and rash.  Neurological:  Negative for dizziness and headaches.  Psychiatric/Behavioral:  Negative for agitation and dysphoric mood.        Objective:     BP 128/70   Pulse 70   Temp 98 F (36.7 C)   Resp 16   Ht 5\' 3"  (1.6 m)   Wt 185 lb 9.6 oz (84.2 kg)   SpO2 98%   BMI 32.88 kg/m  Wt Readings from Last 3 Encounters:  02/27/23 185 lb 9.6 oz (84.2 kg)  12/20/22 180 lb (81.6 kg)  10/25/22 183 lb 1 oz (83 kg)    Physical Exam Vitals reviewed.  Constitutional:      General: She is not in acute distress.    Appearance: Normal appearance.  HENT:     Head: Normocephalic and atraumatic.     Right Ear: External ear normal.     Left Ear: External ear normal.  Eyes:     General: No scleral icterus.       Right eye: No discharge.        Left eye: No discharge.     Conjunctiva/sclera: Conjunctivae normal.  Neck:     Thyroid: No thyromegaly.  Cardiovascular:     Rate and Rhythm: Normal rate and regular rhythm.  Pulmonary:     Effort: No respiratory distress.     Breath sounds: Normal breath sounds. No wheezing.  Abdominal:     General: Bowel sounds are normal.     Palpations: Abdomen is soft.     Tenderness: There is no abdominal tenderness.  Musculoskeletal:        General: No swelling or tenderness.     Cervical back: Neck supple. No tenderness.  Lymphadenopathy:     Cervical: No cervical adenopathy.  Skin:    Findings: No erythema or rash.  Neurological:     Mental Status: She is alert.  Psychiatric:        Mood and Affect: Mood normal.        Behavior: Behavior normal.      Outpatient Encounter Medications as of 02/27/2023  Medication Sig   cefdinir (OMNICEF) 300 MG capsule Take 1 capsule (300 mg total) by mouth 2 (two) times daily.   albuterol (VENTOLIN HFA) 108 (90  Base) MCG/ACT inhaler Inhale 1-2 puffs into the lungs every 6 (six) hours as needed for wheezing or shortness of breath.   azelastine (ASTELIN) 0.1 % nasal spray Place 1 spray into both  nostrils in the morning and at bedtime.   docusate sodium (COLACE) 100 MG capsule Take 1 capsule (100 mg total) by mouth every 12 (twelve) hours.   fluticasone (FLONASE) 50 MCG/ACT nasal spray Place 2 sprays into both nostrils daily.   guaiFENesin (MUCINEX) 600 MG 12 hr tablet Take 2 tablets (1,200 mg total) by mouth 2 (two) times daily as needed for cough or to loosen phlegm.   hydrochlorothiazide (HYDRODIURIL) 25 MG tablet TAKE 1 TABLET EVERY DAY   meclizine (ANTIVERT) 25 MG tablet Take 1 tablet (25 mg total) by mouth 3 (three) times daily as needed for dizziness.   nystatin cream (MYCOSTATIN) Apply 1 application topically 2 (two) times daily.   pantoprazole (PROTONIX) 40 MG tablet TAKE 1 TABLET(40 MG) BY MOUTH DAILY   pravastatin (PRAVACHOL) 40 MG tablet Take 1 tablet (40 mg total) by mouth daily.   Probiotic Product (ALIGN PO) Take by mouth.   [DISCONTINUED] nitrofurantoin, macrocrystal-monohydrate, (MACROBID) 100 MG capsule Take 1 capsule (100 mg total) by mouth 2 (two) times daily.   No facility-administered encounter medications on file as of 02/27/2023.     Lab Results  Component Value Date   WBC 6.9 10/20/2022   HGB 12.6 10/20/2022   HCT 38.3 10/20/2022   PLT 328.0 10/20/2022   GLUCOSE 112 (H) 10/20/2022   CHOL 187 10/20/2022   TRIG 105.0 10/20/2022   HDL 58.30 10/20/2022   LDLDIRECT 142.7 12/11/2013   LDLCALC 107 (H) 10/20/2022   ALT 12 10/20/2022   AST 17 10/20/2022   NA 139 10/20/2022   K 3.8 10/20/2022   CL 105 10/20/2022   CREATININE 0.91 10/20/2022   BUN 11 10/20/2022   CO2 29 10/20/2022   TSH 1.49 10/20/2022   INR 0.9 11/25/2021   HGBA1C 6.3 10/20/2022   MICROALBUR 0.9 02/02/2022    MM 3D SCREEN BREAST BILATERAL  Result Date: 12/25/2022 CLINICAL DATA:  Screening. EXAM:  DIGITAL SCREENING BILATERAL MAMMOGRAM WITH TOMOSYNTHESIS AND CAD TECHNIQUE: Bilateral screening digital craniocaudal and mediolateral oblique mammograms were obtained. Bilateral screening digital breast tomosynthesis was performed. The images were evaluated with computer-aided detection. COMPARISON:  Previous exam(s). ACR Breast Density Category b: There are scattered areas of fibroglandular density. FINDINGS: There are no findings suspicious for malignancy. IMPRESSION: No mammographic evidence of malignancy. A result letter of this screening mammogram will be mailed directly to the patient. RECOMMENDATION: Screening mammogram in one year. (Code:SM-B-01Y) BI-RADS CATEGORY  1: Negative. Electronically Signed   By: Nolon Nations M.D.   On: 12/25/2022 10:31       Assessment & Plan:  Type 2 diabetes mellitus with hyperglycemia, without long-term current use of insulin (HCC) Assessment & Plan: Low carb diet and exercise given elevated blood sugars. Follow met b and a1c.  Lab Results  Component Value Date   HGBA1C 6.3 10/20/2022    Orders: -     Microalbumin / creatinine urine ratio; Future  Anemia, unspecified type Assessment & Plan: Follow cbc.    Mild intermittent asthma without complication Assessment & Plan: Treat current infection.  Has rescue inhaler if needed.  Breathing stable.    Cough, unspecified type Assessment & Plan: Increased cough and congestion as outlined.  Continue mucinex and flonase as directed.  Saline nasal spray.  Omnicef.  Follow.  Call with update.     Gastroesophageal reflux disease, unspecified whether esophagitis present Assessment & Plan: Continue protonix.  Follow.    Hypercholesterolemia Assessment & Plan: The 10-year ASCVD risk score (Arnett DK, et al.,  2019) is: 36.2%   Values used to calculate the score:     Age: 5 years     Sex: Female     Is Non-Hispanic African American: Yes     Diabetic: Yes     Tobacco smoker: No     Systolic Blood  Pressure: 128 mmHg     Is BP treated: Yes     HDL Cholesterol: 58.3 mg/dL     Total Cholesterol: 187 mg/dL  On pravastatin  Follow lipid panel and liver function tests.    Primary hypertension Assessment & Plan: Continue hctz.  Blood pressure as outlined. Follow pressures.  Follow metabolic panel.    OSA (obstructive sleep apnea) Assessment & Plan: CPAP.    Other orders -     Cefdinir; Take 1 capsule (300 mg total) by mouth 2 (two) times daily.  Dispense: 14 capsule; Refill: 0     Einar Pheasant, MD

## 2023-03-04 ENCOUNTER — Encounter: Payer: Self-pay | Admitting: Internal Medicine

## 2023-03-04 NOTE — Assessment & Plan Note (Signed)
CPAP.  

## 2023-03-04 NOTE — Assessment & Plan Note (Signed)
Treat current infection.  Has rescue inhaler if needed.  Breathing stable.

## 2023-03-04 NOTE — Assessment & Plan Note (Addendum)
The 10-year ASCVD risk score (Arnett DK, et al., 2019) is: 36.2%   Values used to calculate the score:     Age: 77 years     Sex: Female     Is Non-Hispanic African American: Yes     Diabetic: Yes     Tobacco smoker: No     Systolic Blood Pressure: 0000000 mmHg     Is BP treated: Yes     HDL Cholesterol: 58.3 mg/dL     Total Cholesterol: 187 mg/dL  On pravastatin  Follow lipid panel and liver function tests.

## 2023-03-04 NOTE — Assessment & Plan Note (Signed)
Continue protonix.  Follow.  

## 2023-03-04 NOTE — Assessment & Plan Note (Signed)
Continue hctz.  Blood pressure as outlined.  Follow pressures.  Follow metabolic panel.  

## 2023-03-04 NOTE — Assessment & Plan Note (Signed)
Low carb diet and exercise given elevated blood sugars. Follow met b and a1c.  Lab Results  Component Value Date   HGBA1C 6.3 10/20/2022   

## 2023-03-04 NOTE — Assessment & Plan Note (Signed)
Increased cough and congestion as outlined.  Continue mucinex and flonase as directed.  Saline nasal spray.  Omnicef.  Follow.  Call with update.

## 2023-03-04 NOTE — Assessment & Plan Note (Signed)
Follow cbc.  

## 2023-03-15 ENCOUNTER — Other Ambulatory Visit: Payer: Medicare PPO

## 2023-03-15 ENCOUNTER — Ambulatory Visit (INDEPENDENT_AMBULATORY_CARE_PROVIDER_SITE_OTHER): Payer: Medicare PPO

## 2023-03-15 DIAGNOSIS — E1165 Type 2 diabetes mellitus with hyperglycemia: Secondary | ICD-10-CM

## 2023-03-15 DIAGNOSIS — E7800 Pure hypercholesterolemia, unspecified: Secondary | ICD-10-CM

## 2023-03-15 DIAGNOSIS — E538 Deficiency of other specified B group vitamins: Secondary | ICD-10-CM | POA: Diagnosis not present

## 2023-03-15 DIAGNOSIS — I10 Essential (primary) hypertension: Secondary | ICD-10-CM

## 2023-03-15 LAB — LIPID PANEL
Cholesterol: 187 mg/dL (ref 0–200)
HDL: 57.3 mg/dL (ref >39.00)
LDL Cholesterol: 108 mg/dL — ABNORMAL HIGH (ref 0–99)
NonHDL: 129.56
Total CHOL/HDL Ratio: 3
Triglycerides: 108 mg/dL (ref 0.0–149.0)
VLDL: 21.6 mg/dL (ref 0.0–40.0)

## 2023-03-15 LAB — HEPATIC FUNCTION PANEL
ALT: 10 U/L (ref 0–35)
AST: 15 U/L (ref 0–37)
Albumin: 4 g/dL (ref 3.5–5.2)
Alkaline Phosphatase: 81 U/L (ref 39–117)
Bilirubin, Direct: 0.1 mg/dL (ref 0.0–0.3)
Total Bilirubin: 0.5 mg/dL (ref 0.2–1.2)
Total Protein: 6.9 g/dL (ref 6.0–8.3)

## 2023-03-15 LAB — BASIC METABOLIC PANEL
BUN: 12 mg/dL (ref 6–23)
CO2: 28 mEq/L (ref 19–32)
Calcium: 9.6 mg/dL (ref 8.4–10.5)
Chloride: 104 mEq/L (ref 96–112)
Creatinine, Ser: 0.78 mg/dL (ref 0.40–1.20)
GFR: 72.56 mL/min (ref >60.00)
Glucose, Bld: 99 mg/dL (ref 70–99)
Potassium: 3.9 mEq/L (ref 3.5–5.1)
Sodium: 140 mEq/L (ref 135–145)

## 2023-03-15 LAB — MICROALBUMIN / CREATININE URINE RATIO
Creatinine,U: 89 mg/dL
Microalb Creat Ratio: 0.8 mg/g (ref 0.0–30.0)
Microalb, Ur: <0.7 mg/dL (ref 0.0–1.9)

## 2023-03-15 LAB — HEMOGLOBIN A1C: Hgb A1c MFr Bld: 6.1 % (ref 4.6–6.5)

## 2023-03-15 MED ORDER — CYANOCOBALAMIN 1000 MCG/ML IJ SOLN
1000.0000 ug | Freq: Once | INTRAMUSCULAR | Status: AC
  Administered 2023-03-15: 1000 ug via INTRAMUSCULAR

## 2023-03-15 NOTE — Progress Notes (Signed)
Patient presented for B 12 injection to left deltoid, patient voiced no concerns nor showed any signs of distress during injection. 

## 2023-03-16 ENCOUNTER — Telehealth: Payer: Self-pay

## 2023-03-16 NOTE — Telephone Encounter (Signed)
-----   Message from Dale [place], MD sent at 03/16/2023  5:05 AM EDT ----- Notify - overall sugar control improved.  A1c 6.1.  cholesterol levels are relatively stable.  Continue diet and exercise.  Continue pravastatin.  Kidney function tests and liver function tests are wnl.  Schedule fasting labs 1-2 days before f/u appt in August.

## 2023-03-16 NOTE — Telephone Encounter (Signed)
LMTCB. OK to give results and schedule fasting labs 1-2 days prior to her appt in August

## 2023-03-23 ENCOUNTER — Encounter: Payer: Self-pay | Admitting: *Deleted

## 2023-03-26 ENCOUNTER — Telehealth: Payer: Self-pay | Admitting: Internal Medicine

## 2023-03-26 NOTE — Telephone Encounter (Signed)
Contacted Lisa Robles to schedule their annual wellness visit. Appointment made for 04/02/2023.  Thank you,  [facility] Support [facility] Direct dial  [phone]

## 2023-04-02 ENCOUNTER — Ambulatory Visit (INDEPENDENT_AMBULATORY_CARE_PROVIDER_SITE_OTHER): Payer: Medicare PPO

## 2023-04-02 VITALS — Ht 63.0 in | Wt 185.0 lb

## 2023-04-02 DIAGNOSIS — Z0000 Encounter for general adult medical examination without abnormal findings: Secondary | ICD-10-CM | POA: Diagnosis not present

## 2023-04-02 NOTE — Patient Instructions (Addendum)
Lisa Robles , Thank you for taking time to come for your Medicare Wellness Visit. I appreciate your ongoing commitment to your health goals. Please review the following plan we discussed and let me know if I can assist you in the future.   These are the goals we discussed:  Goals       Patient Stated     I would like to lose aout 15lbs (pt-stated)      Stay active Stay hydrated Reduce sugar intake        This is a list of the screening recommended for you and due dates:  Health Maintenance  Topic Date Due   DTaP/Tdap/Td vaccine (2 - Td or Tdap) 05/18/2021   Complete foot exam   09/29/2022   COVID-19 Vaccine (6 - 2023-24 season) 04/18/2023*   Flu Shot  07/05/2023   Hemoglobin A1C  09/14/2023   Eye exam for diabetics  10/05/2023   Yearly kidney function blood test for diabetes  03/14/2024   Yearly kidney health urinalysis for diabetes  03/14/2024   Medicare Annual Wellness Visit  04/01/2024   Pneumonia Vaccine  Completed   DEXA scan (bone density measurement)  Completed   Hepatitis C Screening: USPSTF Recommendation to screen - Ages 66-79 yo.  Completed   Zoster (Shingles) Vaccine  Completed   HPV Vaccine  Aged Out   Colon Cancer Screening  Discontinued  *Topic was postponed. The date shown is not the original due date.    Conditions/risks identified: none new  Next appointment: Follow up in one year for your annual wellness visit   Preventive Care 65 Years and Older, Female Preventive care refers to lifestyle choices and visits with your health care provider that can promote health and wellness. What does preventive care include? A yearly physical exam. This is also called an annual well check. Dental exams once or twice a year. Routine eye exams. Ask your health care provider how often you should have your eyes checked. Personal lifestyle choices, including: Daily care of your teeth and gums. Regular physical activity. Eating a healthy diet. Avoiding tobacco and drug  use. Limiting alcohol use. Practicing safe sex. Taking low-dose aspirin every day. Taking vitamin and mineral supplements as recommended by your health care provider. What happens during an annual well check? The services and screenings done by your health care provider during your annual well check will depend on your age, overall health, lifestyle risk factors, and family history of disease. Counseling  Your health care provider may ask you questions about your: Alcohol use. Tobacco use. Drug use. Emotional well-being. Home and relationship well-being. Sexual activity. Eating habits. History of falls. Memory and ability to understand (cognition). Work and work Astronomer. Reproductive health. Screening  You may have the following tests or measurements: Height, weight, and BMI. Blood pressure. Lipid and cholesterol levels. These may be checked every 5 years, or more frequently if you are over 81 years old. Skin check. Lung cancer screening. You may have this screening every year starting at age 33 if you have a 30-pack-year history of smoking and currently smoke or have quit within the past 15 years. Fecal occult blood test (FOBT) of the stool. You may have this test every year starting at age 30. Flexible sigmoidoscopy or colonoscopy. You may have a sigmoidoscopy every 5 years or a colonoscopy every 10 years starting at age 1. Hepatitis C blood test. Hepatitis B blood test. Sexually transmitted disease (STD) testing. Diabetes screening. This is done by checking  your blood sugar (glucose) after you have not eaten for a while (fasting). You may have this done every 1-3 years. Bone density scan. This is done to screen for osteoporosis. You may have this done starting at age 40. Mammogram. This may be done every 1-2 years. Talk to your health care provider about how often you should have regular mammograms. Talk with your health care provider about your test results, treatment  options, and if necessary, the need for more tests. Vaccines  Your health care provider may recommend certain vaccines, such as: Influenza vaccine. This is recommended every year. Tetanus, diphtheria, and acellular pertussis (Tdap, Td) vaccine. You may need a Td booster every 10 years. Zoster vaccine. You may need this after age 1. Pneumococcal 13-valent conjugate (PCV13) vaccine. One dose is recommended after age 92. Pneumococcal polysaccharide (PPSV23) vaccine. One dose is recommended after age 1. Talk to your health care provider about which screenings and vaccines you need and how often you need them. This information is not intended to replace advice given to you by your health care provider. Make sure you discuss any questions you have with your health care provider. Document Released: 12/17/2015 Document Revised: 08/09/2016 Document Reviewed: 09/21/2015 Elsevier Interactive Patient Education  2017 [employer] Prevention in the Home Falls can cause injuries. They can happen to people of all ages. There are many things you can do to make your home safe and to help prevent falls. What can I do on the outside of my home? Regularly fix the edges of walkways and driveways and fix any cracks. Remove anything that might make you trip as you walk through a door, such as a raised step or threshold. Trim any bushes or trees on the path to your home. Use bright outdoor lighting. Clear any walking paths of anything that might make someone trip, such as rocks or tools. Regularly check to see if handrails are loose or broken. Make sure that both sides of any steps have handrails. Any raised decks and porches should have guardrails on the edges. Have any leaves, snow, or ice cleared regularly. Use sand or salt on walking paths during winter. Clean up any spills in your garage right away. This includes oil or grease spills. What can I do in the bathroom? Use night lights. Install grab  bars by the toilet and in the tub and shower. Do not use towel bars as grab bars. Use non-skid mats or decals in the tub or shower. If you need to sit down in the shower, use a plastic, non-slip stool. Keep the floor dry. Clean up any water that spills on the floor as soon as it happens. Remove soap buildup in the tub or shower regularly. Attach bath mats securely with double-sided non-slip rug tape. Do not have throw rugs and other things on the floor that can make you trip. What can I do in the bedroom? Use night lights. Make sure that you have a light by your bed that is easy to reach. Do not use any sheets or blankets that are too big for your bed. They should not hang down onto the floor. Have a firm chair that has side arms. You can use this for support while you get dressed. Do not have throw rugs and other things on the floor that can make you trip. What can I do in the kitchen? Clean up any spills right away. Avoid walking on wet floors. Keep items that you use a lot  in easy-to-reach places. If you need to reach something above you, use a strong step stool that has a grab bar. Keep electrical cords out of the way. Do not use floor polish or wax that makes floors slippery. If you must use wax, use non-skid floor wax. Do not have throw rugs and other things on the floor that can make you trip. What can I do with my stairs? Do not leave any items on the stairs. Make sure that there are handrails on both sides of the stairs and use them. Fix handrails that are broken or loose. Make sure that handrails are as long as the stairways. Check any carpeting to make sure that it is firmly attached to the stairs. Fix any carpet that is loose or worn. Avoid having throw rugs at the top or bottom of the stairs. If you do have throw rugs, attach them to the floor with carpet tape. Make sure that you have a light switch at the top of the stairs and the bottom of the stairs. If you do not have them,  ask someone to add them for you. What else can I do to help prevent falls? Wear shoes that: Do not have high heels. Have rubber bottoms. Are comfortable and fit you well. Are closed at the toe. Do not wear sandals. If you use a stepladder: Make sure that it is fully opened. Do not climb a closed stepladder. Make sure that both sides of the stepladder are locked into place. Ask someone to hold it for you, if possible. Clearly mark and make sure that you can see: Any grab bars or handrails. First and last steps. Where the edge of each step is. Use tools that help you move around (mobility aids) if they are needed. These include: Canes. Walkers. Scooters. Crutches. Turn on the lights when you go into a dark area. Replace any light bulbs as soon as they burn out. Set up your furniture so you have a clear path. Avoid moving your furniture around. If any of your floors are uneven, fix them. If there are any pets around you, be aware of where they are. Review your medicines with your doctor. Some medicines can make you feel dizzy. This can increase your chance of falling. Ask your doctor what other things that you can do to help prevent falls. This information is not intended to replace advice given to you by your health care provider. Make sure you discuss any questions you have with your health care provider. Document Released: 09/16/2009 Document Revised: 04/27/2016 Document Reviewed: 12/25/2014 Elsevier Interactive Patient Education  2017 [employer].

## 2023-04-02 NOTE — Progress Notes (Signed)
Subjective:   Lisa Robles is a 77 y.o. female who presents for Medicare Annual (Subsequent) preventive examination.  Review of Systems    No ROS.  Medicare Wellness Virtual Visit.  Visual/audio telehealth visit, UTA vital signs.   See social history for additional risk factors.   Cardiac Risk Factors include: advanced age (>59men, >91 women);hypertension     Objective:    Today's Vitals   04/02/23 1406  Weight: 185 lb (83.9 kg)  Height: 5\' 3"  (1.6 m)   Body mass index is 32.77 kg/m.     04/02/2023    2:25 PM 03/15/2022    9:35 AM 11/25/2021    9:30 AM 10/12/2021    9:58 AM 03/09/2021   10:45 AM 03/08/2020    2:16 PM 03/02/2020    1:46 PM  Advanced Directives  Does Patient Have a Medical Advance Directive? No No Yes Yes Yes Yes Yes  Type of Surveyor, minerals;Living will Healthcare Power of [place];Living will Healthcare Power of [place];Living will Healthcare Power of [place];Living will Healthcare Power of [place];Living will  Does patient want to make changes to medical advance directive?    Yes (Inpatient - patient defers changing a medical advance directive and declines information at this time) No - Patient declined  No - Patient declined  Copy of Healthcare Power of Attorney in Chart?     No - copy requested No - copy requested   Would patient like information on creating a medical advance directive? No - Patient declined No - Patient declined         Current Medications (verified) Outpatient Encounter Medications as of 04/02/2023  Medication Sig   albuterol (VENTOLIN HFA) 108 (90 Base) MCG/ACT inhaler Inhale 1-2 puffs into the lungs every 6 (six) hours as needed for wheezing or shortness of breath.   azelastine (ASTELIN) 0.1 % nasal spray Place 1 spray into both nostrils in the morning and at bedtime.   cefdinir (OMNICEF) 300 MG capsule Take 1 capsule (300 mg total) by mouth 2 (two) times daily.   docusate sodium (COLACE) 100 MG  capsule Take 1 capsule (100 mg total) by mouth every 12 (twelve) hours.   fluticasone (FLONASE) 50 MCG/ACT nasal spray Place 2 sprays into both nostrils daily.   guaiFENesin (MUCINEX) 600 MG 12 hr tablet Take 2 tablets (1,200 mg total) by mouth 2 (two) times daily as needed for cough or to loosen phlegm.   hydrochlorothiazide (HYDRODIURIL) 25 MG tablet TAKE 1 TABLET EVERY DAY   meclizine (ANTIVERT) 25 MG tablet Take 1 tablet (25 mg total) by mouth 3 (three) times daily as needed for dizziness.   nystatin cream (MYCOSTATIN) Apply 1 application topically 2 (two) times daily.   pantoprazole (PROTONIX) 40 MG tablet TAKE 1 TABLET(40 MG) BY MOUTH DAILY   pravastatin (PRAVACHOL) 40 MG tablet Take 1 tablet (40 mg total) by mouth daily.   Probiotic Product (ALIGN PO) Take by mouth.   No facility-administered encounter medications on file as of 04/02/2023.    Allergies (verified) Cinnamon, Bactrim [sulfamethoxazole-trimethoprim], and Shellfish allergy   History: Past Medical History:  Diagnosis Date   Diabetes mellitus without complication (HCC)    GERD (gastroesophageal reflux disease)    Hypercholesteremia    Hypertension    Past Surgical History:  Procedure Laterality Date   BREAST BIOPSY     TOOTH EXTRACTION     tooth implantation   TUBAL LIGATION  1979   Family History  Problem Relation  Age of Onset   Heart attack Mother    Heart attack Father    Hypercholesterolemia Brother    Diabetes Brother    Cancer Maternal Aunt        breast   Diabetes Maternal Grandmother    Schizophrenia Grandson    Colon cancer Neg Hx    Breast cancer Neg Hx    Social History   Socioeconomic History   Marital status: Widowed    Spouse name: Not on file   Number of children: 2   Years of education: masters   Highest education level: Not on file  Occupational History   Occupation: retired Runner, broadcasting/film/video  Tobacco Use   Smoking status: Former    Packs/day: 1.00    Years: 7.00    Additional pack  years: 0.00    Total pack years: 7.00    Types: Cigarettes    Quit date: 12/04/1977    Years since quitting: 45.3   Smokeless tobacco: Never  Vaping Use   Vaping Use: Never used  Substance and Sexual Activity   Alcohol use: No    Alcohol/week: 0.0 standard drinks of alcohol   Drug use: No   Sexual activity: Yes    Birth control/protection: None    Comment: Is sexually active wih same partner x 14 years. Recently found out partner has been cheating.  Other Topics Concern   Not on file  Social History Narrative   Not on file   Social Determinants of Health   Financial Resource Strain: Low Risk  (04/02/2023)   Overall Financial Resource Strain (CARDIA)    Difficulty of Paying Living Expenses: Not hard at all  Food Insecurity: No Food Insecurity (04/02/2023)   Hunger Vital Sign    Worried About Running Out of Food in the Last Year: Never true    Ran Out of Food in the Last Year: Never true  Transportation Needs: No Transportation Needs (04/02/2023)   PRAPARE - Administrator, Civil Service (Medical): No    Lack of Transportation (Non-Medical): No  Physical Activity: Sufficiently Active (04/02/2023)   Exercise Vital Sign    Days of Exercise per Week: 5 days    Minutes of Exercise per Session: 60 min  Stress: No Stress Concern Present (04/02/2023)   [employer] of Occupational Health - Occupational Stress Questionnaire    Feeling of Stress : Not at all  Social Connections: Unknown (04/02/2023)   Social Connection and Isolation Panel [NHANES]    Frequency of Communication with Friends and Family: More than three times a week    Frequency of Social Gatherings with Friends and Family: More than three times a week    Attends Religious Services: Not on file    Active Member of Clubs or Organizations: Yes    Attends Banker Meetings: Not on file    Marital Status: Widowed    Tobacco Counseling Counseling given: Not Answered   Clinical  Intake:  Pre-visit preparation completed: Yes        Diabetes:  (Followed by pcp)  How often do you need to have someone help you when you read instructions, pamphlets, or other written materials from your doctor or pharmacy?: 1 - Never    Interpreter Needed?: No      Activities of Daily Living    04/02/2023    2:12 PM  In your present state of health, do you have any difficulty performing the following activities:  Hearing? 0  Vision? 0  Difficulty  concentrating or making decisions? 0  Walking or climbing stairs? 0  Dressing or bathing? 0  Doing errands, shopping? 0  Preparing Food and eating ? N  Using the Toilet? N  In the past six months, have you accidently leaked urine? Y  Comment Managed with daily pad/liner  Do you have problems with loss of bowel control? N  Managing your Medications? N  Managing your Finances? N  Housekeeping or managing your Housekeeping? N  Comment Maid assist as needed    Patient Care Team: Dale Mineral, MD as PCP - General (Internal Medicine) Debbe Odea, MD as PCP - Cardiology (Cardiology)  Indicate any recent Medical Services you may have received from other than [facility] providers in the past year (date may be approximate).     Assessment:   This is a routine wellness examination for Kambrey.  I connected with  Keitha Butte on 04/02/23 by a audio enabled telemedicine application and verified that I am speaking with the correct person using two identifiers.  Patient Location: Home  Provider Location: Office/Clinic  I discussed the limitations of evaluation and management by telemedicine. The patient expressed understanding and agreed to proceed.   Hearing/Vision screen Hearing Screening - Comments:: Patient is able to hear conversational tones without difficulty.  No issues reported.   Vision Screening - Comments:: Wears corrective lenses. No retinopathy.  Dietary issues and exercise activities  discussed: Current Exercise Habits: Home exercise routine, Type of exercise: yoga;treadmill;strength training/weights;stretching (Silver seaker class. Young at heart.), Time (Minutes): 60, Frequency (Times/Week): 5, Weekly Exercise (Minutes/Week): 300, Intensity: Mild No sugar desserts Lean meats; [place], chicken and primarily fish like salmon Good water intake w/lemon   Goals Addressed               This Visit's Progress     Patient Stated     I would like to lose aout 15lbs (pt-stated)   On track     Stay active Stay hydrated Reduce sugar intake       Depression Screen    04/02/2023    2:28 PM 03/15/2022    9:37 AM 02/02/2022   10:47 AM 09/29/2021   10:47 AM 03/09/2021   10:44 AM 03/08/2020    1:51 PM 03/02/2020    1:46 PM  PHQ 2/9 Scores  PHQ - 2 Score 0 0 2 0 0 0 0  PHQ- 9 Score  0 4        Fall Risk    04/02/2023    2:28 PM 03/15/2022    9:36 AM 02/02/2022   10:47 AM 09/29/2021   10:48 AM 03/09/2021   10:46 AM  Fall Risk   Falls in the past year? 0   1 1  Number falls in past yr: 0   0 0  Injury with Fall? 0   1 1  Comment     Notes she passed out while on vacation. Sought medical care for cut on eye  Risk for fall due to :   History of fall(s) History of fall(s)   Follow up Falls evaluation completed;Falls prevention discussed Falls evaluation completed Falls evaluation completed Falls evaluation completed Falls evaluation completed    FALL RISK PREVENTION PERTAINING TO THE HOME: Home free of loose throw rugs in walkways, pet beds, electrical cords, etc? Yes  Adequate lighting in your home to reduce risk of falls? Yes   ASSISTIVE DEVICES UTILIZED TO PREVENT FALLS: Phone Life alert? Yes  Use of a cane, walker or w/c? No  Grab bars in the bathroom? Yes  Shower chair or bench in shower? Yes  Elevated toilet seat or a handicapped toilet? No   TIMED UP AND GO: Was the test performed? No .                  Cognitive Function:    01/18/2017    8:22 AM 01/19/2016     8:36 AM  MMSE - Mini Mental State Exam  Orientation to time 5 5  Orientation to Place 5 5  Registration 3 3  Attention/ Calculation 5 5  Recall 3 3  Language- name 2 objects 2 2  Language- repeat 1 1  Language- follow 3 step command 3 3  Language- read & follow direction 1 1  Write a sentence 1 1  Copy design 1 1  Total score 30 30        04/02/2023    2:28 PM 03/08/2020    1:57 PM 03/05/2019    1:29 PM 01/21/2018    9:05 AM  6CIT Screen  What Year? 0 points 0 points 0 points 0 points  What month? 0 points 0 points 0 points 0 points  What time? 0 points 0 points 0 points 0 points  Count back from 20 0 points 0 points 0 points 0 points  Months in reverse 0 points 0 points 0 points 0 points  Repeat phrase 0 points 0 points 0 points 0 points  Total Score 0 points 0 points 0 points 0 points    Immunizations Immunization History  Administered Date(s) Administered   Fluad Quad(high Dose 65+) 08/28/2019, 09/22/2020, 09/12/2021   Influenza, High Dose Seasonal PF 08/17/2016, 08/22/2017, 08/28/2018, 09/03/2022   Influenza,inj,Quad PF,6+ Mos 07/29/2014   Influenza-Unspecified 09/01/2013, 09/09/2015   PFIZER(Purple Top)SARS-COV-2 Vaccination 12/10/2019, 12/31/2019, 09/03/2020, 04/22/2021   Pfizer Covid-19 Vaccine Bivalent Booster 76yrs & up 09/03/2022   Pneumococcal Conjugate-13 12/01/2015   Pneumococcal Polysaccharide-23 07/18/2011, 06/28/2017   Respiratory Syncytial Virus Vaccine,Recomb Aduvanted(Arexvy) 09/12/2022   Tdap 05/19/2011   Zoster Recombinat (Shingrix) 04/09/2017, 06/28/2017    TDAP status: Due, Education has been provided regarding the importance of this vaccine. Advised may receive this vaccine at local pharmacy or Health Dept. Aware to provide a copy of the vaccination record if obtained from local pharmacy or Health Dept. Verbalized acceptance and understanding.   Covid-19 vaccine status: Completed vaccines  Screening Tests Health Maintenance  Topic Date Due    DTaP/Tdap/Td (2 - Td or Tdap) 05/18/2021   FOOT EXAM  09/29/2022   COVID-19 Vaccine (6 - 2023-24 season) 04/18/2023 (Originally 10/29/2022)   INFLUENZA VACCINE  07/05/2023   HEMOGLOBIN A1C  09/14/2023   OPHTHALMOLOGY EXAM  10/05/2023   Diabetic kidney evaluation - eGFR measurement  03/14/2024   Diabetic kidney evaluation - Urine ACR  03/14/2024   Medicare Annual Wellness (AWV)  04/01/2024   Pneumonia Vaccine 42+ Years old  Completed   DEXA SCAN  Completed   Hepatitis C Screening  Completed   Zoster Vaccines- Shingrix  Completed   HPV VACCINES  Aged Out   COLONOSCOPY (Pts 45-30yrs Insurance coverage will need to be confirmed)  Discontinued    Health Maintenance Health Maintenance Due  Topic Date Due   DTaP/Tdap/Td (2 - Td or Tdap) 05/18/2021   FOOT EXAM  09/29/2022   Lung Cancer Screening: (Low Dose CT Chest recommended if Age 82-80 years, 30 pack-year currently smoking OR have quit w/in 15years.) does not qualify.   Hepatitis C Screening: Completed 01/2016.  Vision  Screening: Recommended annual ophthalmology exams for early detection of glaucoma and other disorders of the eye.  Dental Screening: Recommended annual dental exams for proper oral hygiene  Community Resource Referral / Chronic Care Management: CRR required this visit?  No   CCM required this visit?  No      Plan:     I have personally reviewed and noted the following in the patient's chart:   Medical and social history Use of alcohol, tobacco or illicit drugs  Current medications and supplements including opioid prescriptions. Patient is not currently taking opioid prescriptions. Functional ability and status Nutritional status Physical activity Advanced directives List of other physicians Hospitalizations, surgeries, and ER visits in previous 12 months Vitals Screenings to include cognitive, depression, and falls Referrals and appointments  In addition, I have reviewed and discussed with patient  certain preventive protocols, quality metrics, and best practice recommendations. A written personalized care plan for preventive services as well as general preventive health recommendations were provided to patient.     Cathey Endow, LPN   08/01/5620

## 2023-04-15 DIAGNOSIS — G4733 Obstructive sleep apnea (adult) (pediatric): Secondary | ICD-10-CM | POA: Diagnosis not present

## 2023-04-17 ENCOUNTER — Ambulatory Visit: Payer: Medicare PPO

## 2023-04-19 DIAGNOSIS — J302 Other seasonal allergic rhinitis: Secondary | ICD-10-CM | POA: Diagnosis not present

## 2023-04-19 DIAGNOSIS — J328 Other chronic sinusitis: Secondary | ICD-10-CM | POA: Diagnosis not present

## 2023-04-23 ENCOUNTER — Ambulatory Visit (INDEPENDENT_AMBULATORY_CARE_PROVIDER_SITE_OTHER): Payer: Medicare PPO

## 2023-04-23 DIAGNOSIS — E538 Deficiency of other specified B group vitamins: Secondary | ICD-10-CM

## 2023-04-23 MED ORDER — CYANOCOBALAMIN 1000 MCG/ML IJ SOLN
1000.0000 ug | Freq: Once | INTRAMUSCULAR | Status: AC
  Administered 2023-04-23: 1000 ug via INTRAMUSCULAR

## 2023-04-23 NOTE — Progress Notes (Signed)
Pt presented for their vitamin B12 injection. Pt was identified through two identifiers. Pt tolerated shot well in their right deltoid.  

## 2023-05-15 DIAGNOSIS — J302 Other seasonal allergic rhinitis: Secondary | ICD-10-CM | POA: Diagnosis not present

## 2023-05-24 ENCOUNTER — Ambulatory Visit (INDEPENDENT_AMBULATORY_CARE_PROVIDER_SITE_OTHER): Payer: Medicare PPO

## 2023-05-24 DIAGNOSIS — E538 Deficiency of other specified B group vitamins: Secondary | ICD-10-CM | POA: Diagnosis not present

## 2023-05-24 MED ORDER — CYANOCOBALAMIN 1000 MCG/ML IJ SOLN
1000.0000 ug | Freq: Once | INTRAMUSCULAR | Status: AC
  Administered 2023-05-24: 1000 ug via INTRAMUSCULAR

## 2023-05-24 NOTE — Progress Notes (Signed)
After obtaining consent, and per orders of Dr. Walsh, injection of b-12 given IM in left deltoid by Andretta Ergle Lynn. Patient tolerated injection well.   

## 2023-06-06 ENCOUNTER — Telehealth: Payer: Self-pay | Admitting: Internal Medicine

## 2023-06-06 NOTE — Telephone Encounter (Signed)
Noted  

## 2023-06-06 NOTE — Telephone Encounter (Signed)
FYI  Spoke with patient to advise that in order to be treated must have appt. Patient stated that she does not feel that appt is needed because she does not feel like she needs to have medication sent in. She has been up this morning and feeling ok. No breathing issues. Able to eat and drink. No fever. Taking mucinex for congestion. She is aware of quarantine guidelines and will let us know if she needs anything. She just wanted you to be aware that she has covid.

## 2023-06-06 NOTE — Telephone Encounter (Signed)
Noted.  Agree with quarantine as instructed.  Agree with monitoring symptoms closely and need for evaluation if any change or worsening problems.

## 2023-06-06 NOTE — Telephone Encounter (Signed)
Pt called in stating that she tested positive for Covid today and she wanted to know if Dr. Lorin Picket can prescribed Paxlovid for her? I offered virtual appts for today, she prefer just the med to be sent over to [facility].

## 2023-06-13 NOTE — Telephone Encounter (Signed)
Prescription Request  06/13/2023  LOV: 02/27/2023  What is the name of the medication or equipment? Advair inhaler  Have you contacted your pharmacy to request a refill? No   Which pharmacy would you like this sent to? [facility]   Patient notified that their request is being sent to the clinical staff for review and that they should receive a response within 2 business days.   Please advise at Mobile [phone] (mobile)

## 2023-06-15 NOTE — Telephone Encounter (Signed)
This medication is not on med list and has not been filled since 2016. Pt says she has just had covid and was wanting it to use prn. She does have an albuterol inhaler. Since patient has not been on advair in years- advised if she feels that advair is needed, may need to be evaluated to determine if advair is needed. Pt stated that she would use her albuterol inhaler this weekend if needed and if she felt like she needed to be seen she would call on Monday. She is still having lingering cough. No other acute symptoms.

## 2023-06-22 NOTE — Telephone Encounter (Signed)
Patient returned call and confirmed that she is feeling better. No acute symptoms and has a cpe scheduled in August.

## 2023-06-22 NOTE — Telephone Encounter (Signed)
LM to follow up with patient and confirm feeling better.

## 2023-06-25 ENCOUNTER — Ambulatory Visit: Payer: Medicare PPO

## 2023-07-04 ENCOUNTER — Encounter (INDEPENDENT_AMBULATORY_CARE_PROVIDER_SITE_OTHER): Payer: Self-pay

## 2023-07-04 ENCOUNTER — Other Ambulatory Visit: Payer: Self-pay | Admitting: Family

## 2023-07-15 DIAGNOSIS — G4733 Obstructive sleep apnea (adult) (pediatric): Secondary | ICD-10-CM | POA: Diagnosis not present

## 2023-07-24 ENCOUNTER — Telehealth: Payer: Self-pay | Admitting: Internal Medicine

## 2023-07-24 DIAGNOSIS — E1165 Type 2 diabetes mellitus with hyperglycemia: Secondary | ICD-10-CM

## 2023-07-24 DIAGNOSIS — E7800 Pure hypercholesterolemia, unspecified: Secondary | ICD-10-CM

## 2023-07-24 DIAGNOSIS — I10 Essential (primary) hypertension: Secondary | ICD-10-CM

## 2023-07-24 NOTE — Telephone Encounter (Signed)
Patient need lab orders.

## 2023-07-24 NOTE — Telephone Encounter (Signed)
Labs ordered.

## 2023-07-26 ENCOUNTER — Other Ambulatory Visit (INDEPENDENT_AMBULATORY_CARE_PROVIDER_SITE_OTHER): Payer: Medicare PPO

## 2023-07-26 DIAGNOSIS — E7800 Pure hypercholesterolemia, unspecified: Secondary | ICD-10-CM | POA: Diagnosis not present

## 2023-07-26 DIAGNOSIS — E1165 Type 2 diabetes mellitus with hyperglycemia: Secondary | ICD-10-CM

## 2023-07-26 LAB — CBC WITH DIFFERENTIAL/PLATELET
Basophils Absolute: 0 10*3/uL (ref 0.0–0.1)
Basophils Relative: 0.5 % (ref 0.0–3.0)
Eosinophils Absolute: 0.1 10*3/uL (ref 0.0–0.7)
Eosinophils Relative: 1.7 % (ref 0.0–5.0)
HCT: 40.6 % (ref 36.0–46.0)
Hemoglobin: 13.3 g/dL (ref 12.0–15.0)
Lymphocytes Relative: 45.7 % (ref 12.0–46.0)
Lymphs Abs: 3.1 10*3/uL (ref 0.7–4.0)
MCHC: 32.8 g/dL (ref 30.0–36.0)
MCV: 93.5 fl (ref 78.0–100.0)
Monocytes Absolute: 0.8 10*3/uL (ref 0.1–1.0)
Monocytes Relative: 11.8 % (ref 3.0–12.0)
Neutro Abs: 2.8 10*3/uL (ref 1.4–7.7)
Neutrophils Relative %: 40.3 % — ABNORMAL LOW (ref 43.0–77.0)
Platelets: 302 10*3/uL (ref 150.0–400.0)
RBC: 4.35 Mil/uL (ref 3.87–5.11)
RDW: 14 % (ref 11.5–15.5)
WBC: 6.8 10*3/uL (ref 4.0–10.5)

## 2023-07-26 LAB — HEMOGLOBIN A1C: Hgb A1c MFr Bld: 6.2 % (ref 4.6–6.5)

## 2023-07-26 LAB — LIPID PANEL
Cholesterol: 197 mg/dL (ref 0–200)
HDL: 57.3 mg/dL (ref >39.00)
LDL Cholesterol: 118 mg/dL — ABNORMAL HIGH (ref 0–99)
NonHDL: 140.15
Total CHOL/HDL Ratio: 3
Triglycerides: 111 mg/dL (ref 0.0–149.0)
VLDL: 22.2 mg/dL (ref 0.0–40.0)

## 2023-07-26 LAB — HEPATIC FUNCTION PANEL
ALT: 11 U/L (ref 0–35)
AST: 18 U/L (ref 0–37)
Albumin: 4 g/dL (ref 3.5–5.2)
Alkaline Phosphatase: 66 U/L (ref 39–117)
Bilirubin, Direct: 0 mg/dL (ref 0.0–0.3)
Total Bilirubin: 0.5 mg/dL (ref 0.2–1.2)
Total Protein: 7.8 g/dL (ref 6.0–8.3)

## 2023-07-26 LAB — BASIC METABOLIC PANEL
BUN: 14 mg/dL (ref 6–23)
CO2: 29 mEq/L (ref 19–32)
Calcium: 10.2 mg/dL (ref 8.4–10.5)
Chloride: 101 mEq/L (ref 96–112)
Creatinine, Ser: 0.82 mg/dL (ref 0.40–1.20)
GFR: 68.16 mL/min (ref >60.00)
Glucose, Bld: 124 mg/dL — ABNORMAL HIGH (ref 70–99)
Potassium: 3.6 mEq/L (ref 3.5–5.1)
Sodium: 138 mEq/L (ref 135–145)

## 2023-07-30 ENCOUNTER — Encounter: Payer: Self-pay | Admitting: Internal Medicine

## 2023-07-30 ENCOUNTER — Ambulatory Visit (INDEPENDENT_AMBULATORY_CARE_PROVIDER_SITE_OTHER): Payer: Medicare PPO

## 2023-07-30 ENCOUNTER — Ambulatory Visit: Payer: Medicare PPO | Admitting: Internal Medicine

## 2023-07-30 VITALS — BP 132/72 | HR 76 | Temp 97.9°F | Resp 17 | Ht 63.0 in | Wt 183.4 lb

## 2023-07-30 DIAGNOSIS — Z8616 Personal history of COVID-19: Secondary | ICD-10-CM

## 2023-07-30 DIAGNOSIS — K219 Gastro-esophageal reflux disease without esophagitis: Secondary | ICD-10-CM

## 2023-07-30 DIAGNOSIS — E538 Deficiency of other specified B group vitamins: Secondary | ICD-10-CM | POA: Diagnosis not present

## 2023-07-30 DIAGNOSIS — R0989 Other specified symptoms and signs involving the circulatory and respiratory systems: Secondary | ICD-10-CM | POA: Diagnosis not present

## 2023-07-30 DIAGNOSIS — I10 Essential (primary) hypertension: Secondary | ICD-10-CM

## 2023-07-30 DIAGNOSIS — E7800 Pure hypercholesterolemia, unspecified: Secondary | ICD-10-CM | POA: Diagnosis not present

## 2023-07-30 DIAGNOSIS — Z0000 Encounter for general adult medical examination without abnormal findings: Secondary | ICD-10-CM

## 2023-07-30 DIAGNOSIS — E1165 Type 2 diabetes mellitus with hyperglycemia: Secondary | ICD-10-CM

## 2023-07-30 DIAGNOSIS — R053 Chronic cough: Secondary | ICD-10-CM | POA: Diagnosis not present

## 2023-07-30 DIAGNOSIS — G4733 Obstructive sleep apnea (adult) (pediatric): Secondary | ICD-10-CM | POA: Diagnosis not present

## 2023-07-30 DIAGNOSIS — I771 Stricture of artery: Secondary | ICD-10-CM | POA: Diagnosis not present

## 2023-07-30 MED ORDER — CYANOCOBALAMIN 1000 MCG/ML IJ SOLN
1000.0000 ug | Freq: Once | INTRAMUSCULAR | Status: AC
  Administered 2023-07-30: 1000 ug via INTRAMUSCULAR

## 2023-07-30 NOTE — Assessment & Plan Note (Signed)
Physical today 07/30/23.  Mammogram 01/01/23 - Birads I.  cologuard 03/2021 - need results.

## 2023-07-30 NOTE — Assessment & Plan Note (Signed)
Continue B12 injections.   

## 2023-07-30 NOTE — Assessment & Plan Note (Signed)
CPAP.  

## 2023-07-30 NOTE — Assessment & Plan Note (Signed)
The 10-year ASCVD risk score (Arnett DK, et al., 2019) is: 40%   Values used to calculate the score:     Age: 77 years     Sex: Female     Is Non-Hispanic African American: Yes     Diabetic: Yes     Tobacco smoker: No     Systolic Blood Pressure: 132 mmHg     Is BP treated: Yes     HDL Cholesterol: 57.3 mg/dL     Total Cholesterol: 197 mg/dL  On pravastatin  Follow lipid panel and liver function tests. Discussed recent labs and LDL level.  She has not been taking her pravastatin as directed.  Will start.  Follow.

## 2023-07-30 NOTE — Assessment & Plan Note (Addendum)
Recent covid.  With persistent cough and congestion.  Check cxr.  Continue steroid nasals spray.  Cough syrup as directed.

## 2023-07-30 NOTE — Progress Notes (Signed)
Subjective:    Patient ID: Lisa Robles, female    DOB: [birth year], 77 y.o.   MRN: [id number]  Patient here for  Chief Complaint  Patient presents with   Annual Exam    HPI Here for a physical exam  recently traveled to [place].  Diagnosed with covid after returning = beginning of 06/2023.  Is feeling better.  Does have persistent cough and congestion.  No headache or fever.  No sob.  No abdominal pain.  Some issues with constipation.  Takes miralax and colace to keep bowels moving.  This regimen does help.     Past Medical History:  Diagnosis Date   Diabetes mellitus without complication (HCC)    GERD (gastroesophageal reflux disease)    Hypercholesteremia    Hypertension    Past Surgical History:  Procedure Laterality Date   BREAST BIOPSY     TOOTH EXTRACTION     tooth implantation   TUBAL LIGATION  1979   Family History  Problem Relation Age of Onset   Heart attack Mother    Heart attack Father    Hypercholesterolemia Brother    Diabetes Brother    Cancer Maternal Aunt        breast   Diabetes Maternal Grandmother    Schizophrenia Grandson    Colon cancer Neg Hx    Breast cancer Neg Hx    Social History   Socioeconomic History   Marital status: Widowed    Spouse name: Not on file   Number of children: 2   Years of education: masters   Highest education level: Not on file  Occupational History   Occupation: retired Runner, broadcasting/film/video  Tobacco Use   Smoking status: Former    Current packs/day: 0.00    Average packs/day: 1 pack/day for 7.0 years (7.0 ttl pk-yrs)    Types: Cigarettes    Start date: 12/04/1970    Quit date: 12/04/1977    Years since quitting: 45.6   Smokeless tobacco: Never  Vaping Use   Vaping status: Never Used  Substance and Sexual Activity   Alcohol use: No    Alcohol/week: 0.0 standard drinks of alcohol   Drug use: No   Sexual activity: Yes    Birth control/protection: None    Comment: Is sexually active wih same partner x 14 years.  Recently found out partner has been cheating.  Other Topics Concern   Not on file  Social History Narrative   Not on file   Social Determinants of Health   Financial Resource Strain: Low Risk  (04/02/2023)   Overall Financial Resource Strain (CARDIA)    Difficulty of Paying Living Expenses: Not hard at all  Food Insecurity: No Food Insecurity (04/02/2023)   Hunger Vital Sign    Worried About Running Out of Food in the Last Year: Never true    Ran Out of Food in the Last Year: Never true  Transportation Needs: No Transportation Needs (04/02/2023)   PRAPARE - Administrator, Civil Service (Medical): No    Lack of Transportation (Non-Medical): No  Physical Activity: Sufficiently Active (04/02/2023)   Exercise Vital Sign    Days of Exercise per Week: 5 days    Minutes of Exercise per Session: 60 min  Stress: No Stress Concern Present (04/02/2023)   [employer] of Occupational Health - Occupational Stress Questionnaire    Feeling of Stress : Not at all  Social Connections: Unknown (04/02/2023)   Social Connection and Isolation Panel [NHANES]  Frequency of Communication with Friends and Family: More than three times a week    Frequency of Social Gatherings with Friends and Family: More than three times a week    Attends Religious Services: Not on file    Active Member of Clubs or Organizations: Yes    Attends Banker Meetings: Not on file    Marital Status: Widowed     Review of Systems  Constitutional:  Negative for appetite change and unexpected weight change.  HENT:  Positive for postnasal drip. Negative for sinus pressure and sore throat.   Eyes:  Negative for pain and visual disturbance.  Respiratory:  Positive for cough. Negative for chest tightness and shortness of breath.   Cardiovascular:  Negative for chest pain and palpitations.  Gastrointestinal:  Positive for constipation. Negative for abdominal pain, diarrhea, nausea and vomiting.   Genitourinary:  Negative for difficulty urinating and dysuria.  Musculoskeletal:  Negative for joint swelling and myalgias.  Skin:  Negative for color change and rash.  Neurological:  Negative for dizziness and headaches.  Hematological:  Negative for adenopathy. Does not bruise/bleed easily.  Psychiatric/Behavioral:  Negative for agitation and dysphoric mood.        Objective:     BP 132/72   Pulse 76   Temp 97.9 F (36.6 C)   Resp 17   Ht 5\' 3"  (1.6 m)   Wt 183 lb 6.4 oz (83.2 kg)   SpO2 98%   BMI 32.49 kg/m  Wt Readings from Last 3 Encounters:  07/30/23 183 lb 6.4 oz (83.2 kg)  04/02/23 185 lb (83.9 kg)  02/27/23 185 lb 9.6 oz (84.2 kg)    Physical Exam Vitals reviewed.  Constitutional:      General: She is not in acute distress.    Appearance: Normal appearance. She is well-developed.  HENT:     Head: Normocephalic and atraumatic.     Right Ear: External ear normal.     Left Ear: External ear normal.  Eyes:     General: No scleral icterus.       Right eye: No discharge.        Left eye: No discharge.     Conjunctiva/sclera: Conjunctivae normal.  Neck:     Thyroid: No thyromegaly.  Cardiovascular:     Rate and Rhythm: Normal rate and regular rhythm.  Pulmonary:     Effort: No tachypnea, accessory muscle usage or respiratory distress.     Breath sounds: Normal breath sounds. No decreased breath sounds or wheezing.  Chest:  Breasts:    Right: No inverted nipple, mass, nipple discharge or tenderness (no axillary adenopathy).     Left: No inverted nipple, mass, nipple discharge or tenderness (no axilarry adenopathy).  Abdominal:     General: Bowel sounds are normal.     Palpations: Abdomen is soft.     Tenderness: There is no abdominal tenderness.  Musculoskeletal:        General: No swelling or tenderness.     Cervical back: Neck supple.  Lymphadenopathy:     Cervical: No cervical adenopathy.  Skin:    Findings: No erythema or rash.  Neurological:      Mental Status: She is alert and oriented to person, place, and time.  Psychiatric:        Mood and Affect: Mood normal.        Behavior: Behavior normal.      Outpatient Encounter Medications as of 07/30/2023  Medication Sig   albuterol (VENTOLIN  HFA) 108 (90 Base) MCG/ACT inhaler Inhale 1-2 puffs into the lungs every 6 (six) hours as needed for wheezing or shortness of breath.   azelastine (ASTELIN) 0.1 % nasal spray Place 1 spray into both nostrils in the morning and at bedtime.   cefdinir (OMNICEF) 300 MG capsule Take 1 capsule (300 mg total) by mouth 2 (two) times daily.   docusate sodium (COLACE) 100 MG capsule Take 1 capsule (100 mg total) by mouth every 12 (twelve) hours.   fluticasone (FLONASE) 50 MCG/ACT nasal spray Place 2 sprays into both nostrils daily.   guaiFENesin (MUCINEX) 600 MG 12 hr tablet Take 2 tablets (1,200 mg total) by mouth 2 (two) times daily as needed for cough or to loosen phlegm.   hydrochlorothiazide (HYDRODIURIL) 25 MG tablet TAKE 1 TABLET EVERY DAY   meclizine (ANTIVERT) 25 MG tablet Take 1 tablet (25 mg total) by mouth 3 (three) times daily as needed for dizziness.   nystatin cream (MYCOSTATIN) Apply 1 application topically 2 (two) times daily.   pantoprazole (PROTONIX) 40 MG tablet TAKE 1 TABLET(40 MG) BY MOUTH DAILY   pravastatin (PRAVACHOL) 40 MG tablet Take 1 tablet (40 mg total) by mouth daily.   Probiotic Product (ALIGN PO) Take by mouth.   No facility-administered encounter medications on file as of 07/30/2023.     Lab Results  Component Value Date   WBC 6.8 07/26/2023   HGB 13.3 07/26/2023   HCT 40.6 07/26/2023   PLT 302.0 07/26/2023   GLUCOSE 124 (H) 07/26/2023   CHOL 197 07/26/2023   TRIG 111.0 07/26/2023   HDL 57.30 07/26/2023   LDLDIRECT 142.7 12/11/2013   LDLCALC 118 (H) 07/26/2023   ALT 11 07/26/2023   AST 18 07/26/2023   NA 138 07/26/2023   K 3.6 07/26/2023   CL 101 07/26/2023   CREATININE 0.82 07/26/2023   BUN 14 07/26/2023    CO2 29 07/26/2023   TSH 1.49 10/20/2022   INR 0.9 11/25/2021   HGBA1C 6.2 07/26/2023   MICROALBUR <0.7 03/15/2023    MM 3D SCREEN BREAST BILATERAL  Result Date: 12/25/2022 CLINICAL DATA:  Screening. EXAM: DIGITAL SCREENING BILATERAL MAMMOGRAM WITH TOMOSYNTHESIS AND CAD TECHNIQUE: Bilateral screening digital craniocaudal and mediolateral oblique mammograms were obtained. Bilateral screening digital breast tomosynthesis was performed. The images were evaluated with computer-aided detection. COMPARISON:  Previous exam(s). ACR Breast Density Category b: There are scattered areas of fibroglandular density. FINDINGS: There are no findings suspicious for malignancy. IMPRESSION: No mammographic evidence of malignancy. A result letter of this screening mammogram will be mailed directly to the patient. RECOMMENDATION: Screening mammogram in one year. (Code:SM-B-01Y) BI-RADS CATEGORY  1: Negative. Electronically Signed   By: Norva Pavlov M.D.   On: 12/25/2022 10:31       Assessment & Plan:  Routine general medical examination at a health care facility  Health care maintenance Assessment & Plan: Physical today 07/30/23.  Mammogram 01/01/23 - Birads I.  cologuard 03/2021 - need results.     History of COVID-19 Assessment & Plan: Recent covid.  With persistent cough and congestion.  Check cxr.  Continue steroid nasals spray.  Cough syrup as directed.    Orders: -     DG Chest 2 View; Future  Type 2 diabetes mellitus with hyperglycemia, without long-term current use of insulin (HCC) Assessment & Plan: Low carb diet and exercise given elevated blood sugars. Follow met b and a1c.  Lab Results  Component Value Date   HGBA1C 6.2 07/26/2023  OSA (obstructive sleep apnea) Assessment & Plan: CPAP.    Primary hypertension Assessment & Plan: Continue hctz.  Blood pressure as outlined. Elevated diastolic on my check - 88-90.  Have her spot check her pressure.  Get her back in soon to  reassess. Follow pressures.  Follow metabolic panel.    Hypercholesterolemia Assessment & Plan: The 10-year ASCVD risk score (Arnett DK, et al., 2019) is: 40%   Values used to calculate the score:     Age: 63 years     Sex: Female     Is Non-Hispanic African American: Yes     Diabetic: Yes     Tobacco smoker: No     Systolic Blood Pressure: 132 mmHg     Is BP treated: Yes     HDL Cholesterol: 57.3 mg/dL     Total Cholesterol: 197 mg/dL  On pravastatin  Follow lipid panel and liver function tests. Discussed recent labs and LDL level.  She has not been taking her pravastatin as directed.  Will start.  Follow.    Gastroesophageal reflux disease, unspecified whether esophagitis present Assessment & Plan: Continue protonix.  Follow.    B12 deficiency Assessment & Plan: Continue B12 injections.       Dale Maiden, MD

## 2023-07-30 NOTE — Addendum Note (Signed)
Addended by: Rita Ohara D on: 07/30/2023 02:31 PM   Modules accepted: Orders

## 2023-07-30 NOTE — Assessment & Plan Note (Signed)
Low carb diet and exercise given elevated blood sugars. Follow met b and a1c.  Lab Results  Component Value Date   HGBA1C 6.2 07/26/2023

## 2023-07-30 NOTE — Assessment & Plan Note (Addendum)
Continue hctz.  Blood pressure as outlined. Elevated diastolic on my check - 88-90.  Have her spot check her pressure.  Get her back in soon to reassess. Follow pressures.  Follow metabolic panel.

## 2023-07-30 NOTE — Assessment & Plan Note (Signed)
Continue protonix.  Follow.   

## 2023-09-24 ENCOUNTER — Encounter: Payer: Self-pay | Admitting: Internal Medicine

## 2023-09-24 ENCOUNTER — Ambulatory Visit: Payer: Medicare PPO | Admitting: Internal Medicine

## 2023-09-24 VITALS — BP 128/72 | HR 62 | Temp 98.0°F | Resp 16 | Ht 63.0 in | Wt 184.0 lb

## 2023-09-24 DIAGNOSIS — I10 Essential (primary) hypertension: Secondary | ICD-10-CM

## 2023-09-24 DIAGNOSIS — G4733 Obstructive sleep apnea (adult) (pediatric): Secondary | ICD-10-CM | POA: Diagnosis not present

## 2023-09-24 DIAGNOSIS — E7800 Pure hypercholesterolemia, unspecified: Secondary | ICD-10-CM | POA: Diagnosis not present

## 2023-09-24 DIAGNOSIS — E1165 Type 2 diabetes mellitus with hyperglycemia: Secondary | ICD-10-CM

## 2023-09-24 DIAGNOSIS — K219 Gastro-esophageal reflux disease without esophagitis: Secondary | ICD-10-CM | POA: Diagnosis not present

## 2023-09-24 DIAGNOSIS — E538 Deficiency of other specified B group vitamins: Secondary | ICD-10-CM | POA: Diagnosis not present

## 2023-09-24 MED ORDER — CYANOCOBALAMIN 1000 MCG/ML IJ SOLN
1000.0000 ug | Freq: Once | INTRAMUSCULAR | Status: AC
  Administered 2023-09-24: 1000 ug via INTRAMUSCULAR

## 2023-09-24 MED ORDER — HYDROCHLOROTHIAZIDE 25 MG PO TABS: 25.0000 mg | ORAL_TABLET | Freq: Every day | ORAL | 3 refills | Status: DC

## 2023-09-24 NOTE — Progress Notes (Signed)
Subjective:    Patient ID: Lisa Robles, female    DOB: 1946/04/18, 77 y.o.   MRN: [id number]  Patient here for  Chief Complaint  Patient presents with   Medical Management of Chronic Issues    HPI Here to follow up regarding her blood pressure.  Diastolic elevation last visit. Continues on hydrochlorothiazide. Tries to stay active.  No chest pain or sob reported.  No increased cough or congestion.  No abdominal pain reported.  Has had some issues with constipation.  Discussed benefiber.    Past Medical History:  Diagnosis Date   Diabetes mellitus without complication (HCC)    GERD (gastroesophageal reflux disease)    Hypercholesteremia    Hypertension    Past Surgical History:  Procedure Laterality Date   BREAST BIOPSY     TOOTH EXTRACTION     tooth implantation   TUBAL LIGATION  1979   Family History  Problem Relation Age of Onset   Heart attack Mother    Heart attack Father    Hypercholesterolemia Brother    Diabetes Brother    Cancer Maternal Aunt        breast   Diabetes Maternal Grandmother    Schizophrenia Grandson    Colon cancer Neg Hx    Breast cancer Neg Hx    Social History   Socioeconomic History   Marital status: Widowed    Spouse name: Not on file   Number of children: 2   Years of education: masters   Highest education level: Not on file  Occupational History   Occupation: retired Runner, broadcasting/film/video  Tobacco Use   Smoking status: Former    Current packs/day: 0.00    Average packs/day: 1 pack/day for 7.0 years (7.0 ttl pk-yrs)    Types: Cigarettes    Start date: 12/04/1970    Quit date: 12/04/1977    Years since quitting: 45.8   Smokeless tobacco: Never  Vaping Use   Vaping status: Never Used  Substance and Sexual Activity   Alcohol use: No    Alcohol/week: 0.0 standard drinks of alcohol   Drug use: No   Sexual activity: Yes    Birth control/protection: None    Comment: Is sexually active wih same partner x 14 years. Recently found out partner  has been cheating.  Other Topics Concern   Not on file  Social History Narrative   Not on file   Social Determinants of Health   Financial Resource Strain: Low Risk  (04/02/2023)   Overall Financial Resource Strain (CARDIA)    Difficulty of Paying Living Expenses: Not hard at all  Food Insecurity: No Food Insecurity (04/02/2023)   Hunger Vital Sign    Worried About Running Out of Food in the Last Year: Never true    Ran Out of Food in the Last Year: Never true  Transportation Needs: No Transportation Needs (04/02/2023)   PRAPARE - Administrator, Civil Service (Medical): No    Lack of Transportation (Non-Medical): No  Physical Activity: Sufficiently Active (04/02/2023)   Exercise Vital Sign    Days of Exercise per Week: 5 days    Minutes of Exercise per Session: 60 min  Stress: No Stress Concern Present (04/02/2023)   [employer] of Occupational Health - Occupational Stress Questionnaire    Feeling of Stress : Not at all  Social Connections: Unknown (04/02/2023)   Social Connection and Isolation Panel [NHANES]    Frequency of Communication with Friends and Family: More than three times  a week    Frequency of Social Gatherings with Friends and Family: More than three times a week    Attends Religious Services: Not on file    Active Member of Clubs or Organizations: Yes    Attends Banker Meetings: Not on file    Marital Status: Widowed     Review of Systems  Constitutional:  Negative for appetite change and unexpected weight change.  HENT:  Negative for congestion and sinus pressure.   Respiratory:  Negative for cough, chest tightness and shortness of breath.   Cardiovascular:  Negative for chest pain and palpitations.  Gastrointestinal:  Positive for constipation. Negative for abdominal pain, diarrhea, nausea and vomiting.  Genitourinary:  Negative for difficulty urinating and dysuria.  Musculoskeletal:  Negative for joint swelling and myalgias.   Skin:  Negative for color change and rash.  Neurological:  Negative for dizziness and headaches.  Psychiatric/Behavioral:  Negative for agitation and dysphoric mood.        Objective:     BP 128/72   Pulse 62   Temp 98 F (36.7 C)   Resp 16   Ht 5\' 3"  (1.6 m)   Wt 184 lb (83.5 kg)   SpO2 98%   BMI 32.59 kg/m  Wt Readings from Last 3 Encounters:  09/24/23 184 lb (83.5 kg)  07/30/23 183 lb 6.4 oz (83.2 kg)  04/02/23 185 lb (83.9 kg)    Physical Exam Vitals reviewed.  Constitutional:      General: She is not in acute distress.    Appearance: Normal appearance.  HENT:     Head: Normocephalic and atraumatic.     Right Ear: External ear normal.     Left Ear: External ear normal.  Eyes:     General: No scleral icterus.       Right eye: No discharge.        Left eye: No discharge.     Conjunctiva/sclera: Conjunctivae normal.  Neck:     Thyroid: No thyromegaly.  Cardiovascular:     Rate and Rhythm: Normal rate and regular rhythm.  Pulmonary:     Effort: No respiratory distress.     Breath sounds: Normal breath sounds. No wheezing.  Abdominal:     General: Bowel sounds are normal.     Palpations: Abdomen is soft.     Tenderness: There is no abdominal tenderness.  Musculoskeletal:        General: No swelling or tenderness.     Cervical back: Neck supple. No tenderness.  Lymphadenopathy:     Cervical: No cervical adenopathy.  Skin:    Findings: No erythema or rash.  Neurological:     Mental Status: She is alert.  Psychiatric:        Mood and Affect: Mood normal.        Behavior: Behavior normal.      Outpatient Encounter Medications as of 09/24/2023  Medication Sig   albuterol (VENTOLIN HFA) 108 (90 Base) MCG/ACT inhaler Inhale 1-2 puffs into the lungs every 6 (six) hours as needed for wheezing or shortness of breath.   azelastine (ASTELIN) 0.1 % nasal spray Place 1 spray into both nostrils in the morning and at bedtime.   docusate sodium (COLACE) 100 MG  capsule Take 1 capsule (100 mg total) by mouth every 12 (twelve) hours.   fluticasone (FLONASE) 50 MCG/ACT nasal spray Place 2 sprays into both nostrils daily.   guaiFENesin (MUCINEX) 600 MG 12 hr tablet Take 2 tablets (1,200 mg  total) by mouth 2 (two) times daily as needed for cough or to loosen phlegm.   hydrochlorothiazide (HYDRODIURIL) 25 MG tablet Take 1 tablet (25 mg total) by mouth daily.   meclizine (ANTIVERT) 25 MG tablet Take 1 tablet (25 mg total) by mouth 3 (three) times daily as needed for dizziness.   nystatin cream (MYCOSTATIN) Apply 1 application topically 2 (two) times daily.   pantoprazole (PROTONIX) 40 MG tablet TAKE 1 TABLET(40 MG) BY MOUTH DAILY   pravastatin (PRAVACHOL) 40 MG tablet Take 1 tablet (40 mg total) by mouth daily.   Probiotic Product (ALIGN PO) Take by mouth.   [DISCONTINUED] cefdinir (OMNICEF) 300 MG capsule Take 1 capsule (300 mg total) by mouth 2 (two) times daily.   [DISCONTINUED] hydrochlorothiazide (HYDRODIURIL) 25 MG tablet TAKE 1 TABLET EVERY DAY   [EXPIRED] cyanocobalamin (VITAMIN B12) injection 1,000 mcg    No facility-administered encounter medications on file as of 09/24/2023.     Lab Results  Component Value Date   WBC 6.8 07/26/2023   HGB 13.3 07/26/2023   HCT 40.6 07/26/2023   PLT 302.0 07/26/2023   GLUCOSE 124 (H) 07/26/2023   CHOL 197 07/26/2023   TRIG 111.0 07/26/2023   HDL 57.30 07/26/2023   LDLDIRECT 142.7 12/11/2013   LDLCALC 118 (H) 07/26/2023   ALT 11 07/26/2023   AST 18 07/26/2023   NA 138 07/26/2023   K 3.6 07/26/2023   CL 101 07/26/2023   CREATININE 0.82 07/26/2023   BUN 14 07/26/2023   CO2 29 07/26/2023   TSH 1.49 10/20/2022   INR 0.9 11/25/2021   HGBA1C 6.2 07/26/2023   MICROALBUR <0.7 03/15/2023    MM 3D SCREEN BREAST BILATERAL  Result Date: 12/25/2022 CLINICAL DATA:  Screening. EXAM: DIGITAL SCREENING BILATERAL MAMMOGRAM WITH TOMOSYNTHESIS AND CAD TECHNIQUE: Bilateral screening digital craniocaudal and  mediolateral oblique mammograms were obtained. Bilateral screening digital breast tomosynthesis was performed. The images were evaluated with computer-aided detection. COMPARISON:  Previous exam(s). ACR Breast Density Category b: There are scattered areas of fibroglandular density. FINDINGS: There are no findings suspicious for malignancy. IMPRESSION: No mammographic evidence of malignancy. A result letter of this screening mammogram will be mailed directly to the patient. RECOMMENDATION: Screening mammogram in one year. (Code:SM-B-01Y) BI-RADS CATEGORY  1: Negative. Electronically Signed   By: Norva Pavlov M.D.   On: 12/25/2022 10:31       Assessment & Plan:  Type 2 diabetes mellitus with hyperglycemia, without long-term current use of insulin (HCC) Assessment & Plan: Low carb diet and exercise given elevated blood sugars. Follow met b and a1c.  Lab Results  Component Value Date   HGBA1C 6.2 07/26/2023    Orders: -     Hemoglobin A1c; Future  Hypercholesterolemia -     Lipid panel; Future -     Hepatic function panel; Future -     Basic metabolic panel; Future -     TSH; Future  B12 deficiency Assessment & Plan: Continue B12 injections.   Orders: -     Cyanocobalamin  OSA (obstructive sleep apnea) Assessment & Plan: CPAP.    Primary hypertension Assessment & Plan: Continue hctz.  Blood pressure as outlined. Follow pressures.  Follow metabolic panel.    Gastroesophageal reflux disease, unspecified whether esophagitis present Assessment & Plan: Continue protonix.  Follow.    Other orders -     hydroCHLOROthiazide; Take 1 tablet (25 mg total) by mouth daily.  Dispense: 90 tablet; Refill: 3     Dale Malvern, MD

## 2023-09-30 ENCOUNTER — Encounter: Payer: Self-pay | Admitting: Internal Medicine

## 2023-09-30 NOTE — Assessment & Plan Note (Signed)
Continue protonix.  Follow.   

## 2023-09-30 NOTE — Assessment & Plan Note (Signed)
Low carb diet and exercise given elevated blood sugars. Follow met b and a1c.  Lab Results  Component Value Date   HGBA1C 6.2 07/26/2023

## 2023-09-30 NOTE — Assessment & Plan Note (Signed)
CPAP.  

## 2023-09-30 NOTE — Assessment & Plan Note (Signed)
Continue hctz.  Blood pressure as outlined.  Follow pressures.  Follow metabolic panel.  

## 2023-09-30 NOTE — Assessment & Plan Note (Signed)
Continue B12 injections.   

## 2023-10-01 ENCOUNTER — Other Ambulatory Visit (HOSPITAL_BASED_OUTPATIENT_CLINIC_OR_DEPARTMENT_OTHER): Payer: Self-pay

## 2023-10-01 ENCOUNTER — Ambulatory Visit: Payer: Medicare PPO | Admitting: Family Medicine

## 2023-10-01 ENCOUNTER — Ambulatory Visit: Payer: Medicare PPO | Admitting: Urgent Care

## 2023-10-01 ENCOUNTER — Encounter: Payer: Self-pay | Admitting: Family Medicine

## 2023-10-01 VITALS — BP 135/63 | HR 63 | Temp 97.7°F | Ht 63.0 in | Wt 187.0 lb

## 2023-10-01 DIAGNOSIS — R300 Dysuria: Secondary | ICD-10-CM | POA: Diagnosis not present

## 2023-10-01 LAB — POC URINALSYSI DIPSTICK (AUTOMATED)
Glucose, UA: NEGATIVE
Nitrite, UA: POSITIVE
Protein, UA: NEGATIVE
Spec Grav, UA: 1.01 (ref 1.010–1.025)
Urobilinogen, UA: 0.2 U/dL
pH, UA: 6 (ref 5.0–8.0)

## 2023-10-01 MED ORDER — CEPHALEXIN 500 MG PO CAPS
500.0000 mg | ORAL_CAPSULE | Freq: Two times a day (BID) | ORAL | 0 refills | Status: AC
  Filled 2023-10-01: qty 14, 7d supply, fill #0

## 2023-10-01 NOTE — Progress Notes (Signed)
Acute Office Visit  Subjective:     Patient ID: Lisa Robles, female    DOB: February 06, 1946, 77 y.o.   MRN: [id number]  Chief Complaint  Patient presents with   Dysuria    Dysuria    Patient is in today for dysuria.   Discussed the use of AI scribe software for clinical note transcription with the patient, who gave verbal consent to proceed.  History of Present Illness   The patient presents with urinary symptoms including burning, pressure, urgency, and frequency for the past three days. They deny fever but report mild low back pain. No hematuria is noted. The patient also reports recent changes in bowel movements, transitioning from constipation to loose stools after taking OTC remedies. The patient has been taking Azo for symptom relief and acknowledges a decrease in fluid intake due to the urinary symptoms.            Review of Systems  Genitourinary:  Positive for dysuria.   All review of systems negative except what is listed in the HPI      Objective:    BP 135/63   Pulse 63   Temp 97.7 F (36.5 C) (Oral)   Ht 5\' 3"  (1.6 m)   Wt 187 lb (84.8 kg)   SpO2 98%   BMI 33.13 kg/m    Physical Exam Vitals reviewed.  Constitutional:      Appearance: Normal appearance.  Pulmonary:     Effort: Pulmonary effort is normal.  Abdominal:     General: Abdomen is flat. Bowel sounds are normal. There is no distension.     Tenderness: There is no abdominal tenderness. There is no right CVA tenderness, left CVA tenderness or guarding.  Neurological:     General: No focal deficit present.     Mental Status: She is alert and oriented to person, place, and time.  Psychiatric:        Mood and Affect: Mood normal.        Behavior: Behavior normal.     Results for orders placed or performed in visit on 10/01/23  POCT Urinalysis Dipstick (Automated)  Result Value Ref Range   Color, UA orange    Clarity, UA clear    Glucose, UA Negative Negative   Bilirubin, UA  large    Ketones, UA trace    Spec Grav, UA 1.010 1.010 - 1.025   Blood, UA moderate    pH, UA 6.0 5.0 - 8.0   Protein, UA Negative Negative   Urobilinogen, UA 0.2 0.2 or 1.0 E.U./dL   Nitrite, UA positive    Leukocytes, UA Large (3+) (A) Negative        Assessment & Plan:   Problem List Items Addressed This Visit   None Visit Diagnoses     Dysuria    -  Primary   Relevant Medications   cephALEXin (KEFLEX) 500 MG capsule   Other Relevant Orders   POCT Urinalysis Dipstick (Automated) (Completed)   Urine Culture   Urine Microscopic Only      Symptoms of burning, pressure, urgency, and frequency for 3 days. Urine sample suggestive of UTI. No fever or gross hematuria reported. Mild low back pain present. -Start Cephalexin (Keflex) while awaiting urine culture results. -Send urine for culture and sensitivity. -Encourage hydration to help flush urinary system. -Continue Azo if found helpful. -If culture results indicate resistance, switch antibiotic as necessary.        Meds ordered this encounter  Medications  cephALEXin (KEFLEX) 500 MG capsule    Sig: Take 1 capsule (500 mg total) by mouth 2 (two) times daily for 7 days.    Dispense:  14 capsule    Refill:  0    Order Specific Question:   Supervising Provider    Answer:   Danise Edge A [[id number]]    Return if symptoms worsen or fail to improve.  Clayborne Dana, NP

## 2023-10-01 NOTE — Patient Instructions (Signed)
Increase fluid intake to a minimum of 8, 8 ounce glasses of water per day to help flush the kidneys and bladder.   If you begin to have worsening symptoms, low back pain, fevers, chills, nausea, vomiting, diarrhea, or decreased urine, please let us know.  Please finish all of your antibiotic even if you begin to feel better before you have completed the full course. It takes the full dose to be completely effective against the bacteria present. Stopping the antibiotic early can lead to the bacteria becoming resistant to the antibiotic and puts you at risk of the antibiotic not working for you in the future.  If we have to change your antibiotic based on culture results, we will let you know.

## 2023-10-02 LAB — URINALYSIS, MICROSCOPIC ONLY

## 2023-10-03 LAB — URINE CULTURE
MICRO NUMBER:: 15655419
SPECIMEN QUALITY:: ADEQUATE

## 2023-10-13 DIAGNOSIS — G4733 Obstructive sleep apnea (adult) (pediatric): Secondary | ICD-10-CM | POA: Diagnosis not present

## 2023-10-25 ENCOUNTER — Ambulatory Visit: Payer: Medicare PPO

## 2023-10-26 ENCOUNTER — Ambulatory Visit (INDEPENDENT_AMBULATORY_CARE_PROVIDER_SITE_OTHER): Payer: Medicare PPO | Admitting: *Deleted

## 2023-10-26 DIAGNOSIS — E538 Deficiency of other specified B group vitamins: Secondary | ICD-10-CM | POA: Diagnosis not present

## 2023-10-26 MED ORDER — CYANOCOBALAMIN 1000 MCG/ML IJ SOLN
1000.0000 ug | Freq: Once | INTRAMUSCULAR | Status: AC
  Administered 2023-10-26: 1000 ug via INTRAMUSCULAR

## 2023-10-26 NOTE — Progress Notes (Signed)
Pt received B12 injection in right deltoid muscle. Pt tolerated it well with no complaints or concerns.

## 2023-11-26 ENCOUNTER — Ambulatory Visit: Payer: Medicare PPO

## 2023-11-26 DIAGNOSIS — E538 Deficiency of other specified B group vitamins: Secondary | ICD-10-CM

## 2023-11-26 MED ORDER — CYANOCOBALAMIN 1000 MCG/ML IJ SOLN
1000.0000 ug | Freq: Once | INTRAMUSCULAR | Status: AC
  Administered 2023-11-26: 1000 ug via INTRAMUSCULAR

## 2023-11-26 NOTE — Progress Notes (Signed)
Pt presented for their vitamin B12 injection. Pt was identified through two identifiers. Pt tolerated shot well in their left  deltoid.  

## 2023-12-13 ENCOUNTER — Other Ambulatory Visit (INDEPENDENT_AMBULATORY_CARE_PROVIDER_SITE_OTHER): Payer: Medicare PPO

## 2023-12-13 DIAGNOSIS — E1165 Type 2 diabetes mellitus with hyperglycemia: Secondary | ICD-10-CM

## 2023-12-13 DIAGNOSIS — E7800 Pure hypercholesterolemia, unspecified: Secondary | ICD-10-CM

## 2023-12-13 LAB — BASIC METABOLIC PANEL
BUN: 12 mg/dL (ref 6–23)
CO2: 30 meq/L (ref 19–32)
Calcium: 10.4 mg/dL (ref 8.4–10.5)
Chloride: 100 meq/L (ref 96–112)
Creatinine, Ser: 0.73 mg/dL (ref 0.40–1.20)
GFR: 78.15 mL/min (ref >60.00)
Glucose, Bld: 112 mg/dL — ABNORMAL HIGH (ref 70–99)
Potassium: 3.8 meq/L (ref 3.5–5.1)
Sodium: 136 meq/L (ref 135–145)

## 2023-12-13 LAB — LIPID PANEL
Cholesterol: 222 mg/dL — ABNORMAL HIGH (ref 0–200)
HDL: 63.2 mg/dL (ref >39.00)
LDL Cholesterol: 140 mg/dL — ABNORMAL HIGH (ref 0–99)
NonHDL: 159.21
Total CHOL/HDL Ratio: 4
Triglycerides: 95 mg/dL (ref 0.0–149.0)
VLDL: 19 mg/dL (ref 0.0–40.0)

## 2023-12-13 LAB — HEPATIC FUNCTION PANEL
ALT: 11 U/L (ref 0–35)
AST: 18 U/L (ref 0–37)
Albumin: 4.4 g/dL (ref 3.5–5.2)
Alkaline Phosphatase: 77 U/L (ref 39–117)
Bilirubin, Direct: 0.1 mg/dL (ref 0.0–0.3)
Total Bilirubin: 0.6 mg/dL (ref 0.2–1.2)
Total Protein: 8.2 g/dL (ref 6.0–8.3)

## 2023-12-13 LAB — TSH: TSH: 1.85 u[IU]/mL (ref 0.35–5.50)

## 2023-12-13 LAB — HEMOGLOBIN A1C: Hgb A1c MFr Bld: 6.2 % (ref 4.6–6.5)

## 2023-12-18 ENCOUNTER — Ambulatory Visit: Payer: Medicare PPO | Admitting: Internal Medicine

## 2023-12-18 ENCOUNTER — Encounter: Payer: Self-pay | Admitting: Internal Medicine

## 2023-12-18 VITALS — BP 126/72 | HR 65 | Temp 97.9°F | Resp 16 | Ht 63.0 in | Wt 182.0 lb

## 2023-12-18 DIAGNOSIS — I10 Essential (primary) hypertension: Secondary | ICD-10-CM

## 2023-12-18 DIAGNOSIS — Z1231 Encounter for screening mammogram for malignant neoplasm of breast: Secondary | ICD-10-CM

## 2023-12-18 DIAGNOSIS — E1165 Type 2 diabetes mellitus with hyperglycemia: Secondary | ICD-10-CM

## 2023-12-18 DIAGNOSIS — G4733 Obstructive sleep apnea (adult) (pediatric): Secondary | ICD-10-CM | POA: Diagnosis not present

## 2023-12-18 DIAGNOSIS — E7800 Pure hypercholesterolemia, unspecified: Secondary | ICD-10-CM | POA: Diagnosis not present

## 2023-12-18 MED ORDER — EZETIMIBE 10 MG PO TABS: 10.0000 mg | ORAL_TABLET | Freq: Every day | ORAL | 3 refills | Status: DC

## 2023-12-18 MED ORDER — PRAVASTATIN SODIUM 40 MG PO TABS: 40.0000 mg | ORAL_TABLET | Freq: Every day | ORAL | 3 refills | Status: DC

## 2023-12-18 NOTE — Assessment & Plan Note (Addendum)
 The 10-year ASCVD risk score (Arnett DK, et al., 2019) is: 46.4%   Values used to calculate the score:     Age: 78 years     Sex: Female     Is Non-Hispanic African American: Yes     Diabetic: Yes     Tobacco smoker: No     Systolic Blood Pressure: 135 mmHg     Is BP treated: Yes     HDL Cholesterol: 63.2 mg/dL     Total Cholesterol: 222 mg/dL  On pravastatin   Follow lipid panel and liver function tests. Discussed recent labs and LDL level.   Discussed treatment options including changing to a different statin. She has had intolerance previously to other statins. Discussed zetia  and repatha. Agreeable to add zetia . Follow.

## 2023-12-18 NOTE — Assessment & Plan Note (Signed)
 CPAP.

## 2023-12-18 NOTE — Progress Notes (Signed)
 Subjective:    Patient ID: Lisa Robles, female    DOB: 1946/08/19, 78 y.o.   MRN: [id number]  Patient here for  Chief Complaint  Patient presents with   Medical Management of Chronic Issues    HPI Here for a follow up appt - follow up regarding diabetes, hypercholesterolemia and hypertension.  Has a history of OSA - cpap. Stays active. No chest pain or sob reported. Some constipation. Takes benefiber or miralax prn. Discussed taking regularly. Had bowel movement this am. Discussed labs. Discussed cholesterol. Taking pravastatin  daily.    Past Medical History:  Diagnosis Date   Diabetes mellitus without complication (HCC)    GERD (gastroesophageal reflux disease)    Hypercholesteremia    Hypertension    Past Surgical History:  Procedure Laterality Date   BREAST BIOPSY     TOOTH EXTRACTION     tooth implantation   TUBAL LIGATION  1979   Family History  Problem Relation Age of Onset   Heart attack Mother    Heart attack Father    Hypercholesterolemia Brother    Diabetes Brother    Cancer Maternal Aunt        breast   Diabetes Maternal Grandmother    Schizophrenia Grandson    Colon cancer Neg Hx    Breast cancer Neg Hx    Social History   Socioeconomic History   Marital status: Widowed    Spouse name: Not on file   Number of children: 2   Years of education: masters   Highest education level: Not on file  Occupational History   Occupation: retired runner, broadcasting/film/video  Tobacco Use   Smoking status: Former    Current packs/day: 0.00    Average packs/day: 1 pack/day for 7.0 years (7.0 ttl pk-yrs)    Types: Cigarettes    Start date: 12/04/1970    Quit date: 12/04/1977    Years since quitting: 46.0   Smokeless tobacco: Never  Vaping Use   Vaping status: Never Used  Substance and Sexual Activity   Alcohol use: No    Alcohol/week: 0.0 standard drinks of alcohol   Drug use: No   Sexual activity: Yes    Birth control/protection: None    Comment: Is sexually active wih  same partner x 14 years. Recently found out partner has been cheating.  Other Topics Concern   Not on file  Social History Narrative   Not on file   Social Drivers of Health   Financial Resource Strain: Low Risk  (04/02/2023)   Overall Financial Resource Strain (CARDIA)    Difficulty of Paying Living Expenses: Not hard at all  Food Insecurity: No Food Insecurity (04/02/2023)   Hunger Vital Sign    Worried About Running Out of Food in the Last Year: Never true    Ran Out of Food in the Last Year: Never true  Transportation Needs: No Transportation Needs (04/02/2023)   PRAPARE - Administrator, Civil Service (Medical): No    Lack of Transportation (Non-Medical): No  Physical Activity: Sufficiently Active (04/02/2023)   Exercise Vital Sign    Days of Exercise per Week: 5 days    Minutes of Exercise per Session: 60 min  Stress: No Stress Concern Present (04/02/2023)   [employer] of Occupational Health - Occupational Stress Questionnaire    Feeling of Stress : Not at all  Social Connections: Unknown (04/02/2023)   Social Connection and Isolation Panel [NHANES]    Frequency of Communication with Friends and Family:  More than three times a week    Frequency of Social Gatherings with Friends and Family: More than three times a week    Attends Religious Services: Not on file    Active Member of Clubs or Organizations: Yes    Attends Banker Meetings: Not on file    Marital Status: Widowed     Review of Systems  Constitutional:  Negative for appetite change and unexpected weight change.  HENT:  Negative for congestion and sinus pressure.   Respiratory:  Negative for cough, chest tightness and shortness of breath.   Cardiovascular:  Negative for chest pain and palpitations.  Gastrointestinal:  Negative for abdominal pain, diarrhea, nausea and vomiting.  Genitourinary:  Negative for difficulty urinating and dysuria.  Musculoskeletal:  Negative for joint  swelling and myalgias.  Skin:  Negative for color change and rash.  Neurological:  Negative for dizziness and headaches.  Psychiatric/Behavioral:  Negative for agitation and dysphoric mood.        Objective:     BP 126/72   Pulse 65   Temp 97.9 F (36.6 C)   Resp 16   Ht 5' 3 (1.6 m)   Wt 182 lb (82.6 kg)   SpO2 98%   BMI 32.24 kg/m  Wt Readings from Last 3 Encounters:  12/18/23 182 lb (82.6 kg)  10/01/23 187 lb (84.8 kg)  09/24/23 184 lb (83.5 kg)    Physical Exam Vitals reviewed.  Constitutional:      General: She is not in acute distress.    Appearance: Normal appearance.  HENT:     Head: Normocephalic and atraumatic.     Right Ear: External ear normal.     Left Ear: External ear normal.     Mouth/Throat:     Pharynx: No oropharyngeal exudate or posterior oropharyngeal erythema.  Eyes:     General: No scleral icterus.       Right eye: No discharge.        Left eye: No discharge.     Conjunctiva/sclera: Conjunctivae normal.  Neck:     Thyroid : No thyromegaly.  Cardiovascular:     Rate and Rhythm: Normal rate and regular rhythm.  Pulmonary:     Effort: No respiratory distress.     Breath sounds: Normal breath sounds. No wheezing.  Abdominal:     General: Bowel sounds are normal.     Palpations: Abdomen is soft.     Tenderness: There is no abdominal tenderness.  Musculoskeletal:        General: No swelling or tenderness.     Cervical back: Neck supple. No tenderness.  Lymphadenopathy:     Cervical: No cervical adenopathy.  Skin:    Findings: No erythema or rash.  Neurological:     Mental Status: She is alert.  Psychiatric:        Mood and Affect: Mood normal.        Behavior: Behavior normal.         Outpatient Encounter Medications as of 12/18/2023  Medication Sig   ezetimibe  (ZETIA ) 10 MG tablet Take 1 tablet (10 mg total) by mouth daily.   albuterol  (VENTOLIN  HFA) 108 (90 Base) MCG/ACT inhaler Inhale 1-2 puffs into the lungs every 6 (six)  hours as needed for wheezing or shortness of breath.   azelastine  (ASTELIN ) 0.1 % nasal spray Place 1 spray into both nostrils in the morning and at bedtime.   docusate sodium  (COLACE) 100 MG capsule Take 1 capsule (100 mg total)  by mouth every 12 (twelve) hours.   fluticasone  (FLONASE ) 50 MCG/ACT nasal spray Place 2 sprays into both nostrils daily.   guaiFENesin  (MUCINEX ) 600 MG 12 hr tablet Take 2 tablets (1,200 mg total) by mouth 2 (two) times daily as needed for cough or to loosen phlegm.   hydrochlorothiazide  (HYDRODIURIL ) 25 MG tablet Take 1 tablet (25 mg total) by mouth daily.   meclizine  (ANTIVERT ) 25 MG tablet Take 1 tablet (25 mg total) by mouth 3 (three) times daily as needed for dizziness.   nystatin  cream (MYCOSTATIN ) Apply 1 application topically 2 (two) times daily.   pantoprazole  (PROTONIX ) 40 MG tablet TAKE 1 TABLET(40 MG) BY MOUTH DAILY   pravastatin  (PRAVACHOL ) 40 MG tablet Take 1 tablet (40 mg total) by mouth daily.   Probiotic Product (ALIGN PO) Take by mouth.   [DISCONTINUED] pravastatin  (PRAVACHOL ) 40 MG tablet Take 1 tablet (40 mg total) by mouth daily.   No facility-administered encounter medications on file as of 12/18/2023.     Lab Results  Component Value Date   WBC 6.8 07/26/2023   HGB 13.3 07/26/2023   HCT 40.6 07/26/2023   PLT 302.0 07/26/2023   GLUCOSE 112 (H) 12/13/2023   CHOL 222 (H) 12/13/2023   TRIG 95.0 12/13/2023   HDL 63.20 12/13/2023   LDLDIRECT 142.7 12/11/2013   LDLCALC 140 (H) 12/13/2023   ALT 11 12/13/2023   AST 18 12/13/2023   NA 136 12/13/2023   K 3.8 12/13/2023   CL 100 12/13/2023   CREATININE 0.73 12/13/2023   BUN 12 12/13/2023   CO2 30 12/13/2023   TSH 1.85 12/13/2023   INR 0.9 11/25/2021   HGBA1C 6.2 12/13/2023   MICROALBUR <0.7 03/15/2023    MM 3D SCREEN BREAST BILATERAL Result Date: 12/25/2022 CLINICAL DATA:  Screening. EXAM: DIGITAL SCREENING BILATERAL MAMMOGRAM WITH TOMOSYNTHESIS AND CAD TECHNIQUE: Bilateral screening  digital craniocaudal and mediolateral oblique mammograms were obtained. Bilateral screening digital breast tomosynthesis was performed. The images were evaluated with computer-aided detection. COMPARISON:  Previous exam(s). ACR Breast Density Category b: There are scattered areas of fibroglandular density. FINDINGS: There are no findings suspicious for malignancy. IMPRESSION: No mammographic evidence of malignancy. A result letter of this screening mammogram will be mailed directly to the patient. RECOMMENDATION: Screening mammogram in one year. (Code:SM-B-01Y) BI-RADS CATEGORY  1: Negative. Electronically Signed   By: Almarie Daring M.D.   On: 12/25/2022 10:31       Assessment & Plan:  Hypercholesterolemia Assessment & Plan: The 10-year ASCVD risk score (Arnett DK, et al., 2019) is: 46.4%   Values used to calculate the score:     Age: 14 years     Sex: Female     Is Non-Hispanic African American: Yes     Diabetic: Yes     Tobacco smoker: No     Systolic Blood Pressure: 135 mmHg     Is BP treated: Yes     HDL Cholesterol: 63.2 mg/dL     Total Cholesterol: 222 mg/dL  On pravastatin   Follow lipid panel and liver function tests. Discussed recent labs and LDL level.   Discussed treatment options including changing to a different statin. She has had intolerance previously to other statins. Discussed zetia  and repatha. Agreeable to add zetia . Follow.   Orders: -     Lipid panel; Future -     Hepatic function panel; Future -     Basic metabolic panel; Future  Type 2 diabetes mellitus with hyperglycemia, without long-term current use of  insulin (HCC) Assessment & Plan: Low carb diet and exercise given elevated blood sugars. Follow met b and a1c.  Lab Results  Component Value Date   HGBA1C 6.2 12/13/2023    Orders: -     Hemoglobin A1c; Future  Visit for screening mammogram -     3D Screening Mammogram, Left and Right; Future  OSA (obstructive sleep apnea) Assessment & Plan: CPAP.     Primary hypertension Assessment & Plan: Continue hctz.  Blood pressure as outlined. Follow pressures.  Follow metabolic panel.    Encounter for screening mammogram for malignant neoplasm of breast -     3D Screening Mammogram, Left and Right; Future  Other orders -     Pravastatin  Sodium; Take 1 tablet (40 mg total) by mouth daily.  Dispense: 90 tablet; Refill: 3 -     Ezetimibe ; Take 1 tablet (10 mg total) by mouth daily.  Dispense: 30 tablet; Refill: 3     Allena Hamilton, MD

## 2023-12-18 NOTE — Assessment & Plan Note (Signed)
 Low carb diet and exercise given elevated blood sugars. Follow met b and a1c.  Lab Results  Component Value Date   HGBA1C 6.2 12/13/2023

## 2023-12-18 NOTE — Assessment & Plan Note (Signed)
Continue hctz.  Blood pressure as outlined.  Follow pressures.  Follow metabolic panel.  

## 2024-01-03 DIAGNOSIS — H25013 Cortical age-related cataract, bilateral: Secondary | ICD-10-CM | POA: Diagnosis not present

## 2024-01-04 ENCOUNTER — Ambulatory Visit
Admission: RE | Admit: 2024-01-04 | Discharge: 2024-01-04 | Disposition: A | Discharge status: Home / Self Care | Payer: Medicare PPO | Source: Ambulatory Visit | Attending: Internal Medicine | Admitting: Internal Medicine

## 2024-01-04 ENCOUNTER — Ambulatory Visit: Payer: Medicare PPO

## 2024-01-04 DIAGNOSIS — Z1231 Encounter for screening mammogram for malignant neoplasm of breast: Secondary | ICD-10-CM

## 2024-01-07 ENCOUNTER — Ambulatory Visit
Admission: RE | Admit: 2024-01-07 | Discharge: 2024-01-07 | Discharge status: Home / Self Care | Payer: Medicare PPO | Source: Ambulatory Visit | Attending: Internal Medicine | Admitting: Internal Medicine

## 2024-01-07 DIAGNOSIS — Z1231 Encounter for screening mammogram for malignant neoplasm of breast: Secondary | ICD-10-CM | POA: Diagnosis not present

## 2024-01-11 DIAGNOSIS — G4733 Obstructive sleep apnea (adult) (pediatric): Secondary | ICD-10-CM | POA: Diagnosis not present

## 2024-01-14 ENCOUNTER — Ambulatory Visit (INDEPENDENT_AMBULATORY_CARE_PROVIDER_SITE_OTHER): Payer: Medicare PPO

## 2024-01-14 DIAGNOSIS — E538 Deficiency of other specified B group vitamins: Secondary | ICD-10-CM | POA: Diagnosis not present

## 2024-01-14 MED ORDER — CYANOCOBALAMIN 1000 MCG/ML IJ SOLN
1000.0000 ug | Freq: Once | INTRAMUSCULAR | Status: AC
  Administered 2024-01-14: 1000 ug via INTRAMUSCULAR

## 2024-01-14 NOTE — Progress Notes (Signed)
After obtaining consent, and per orders of Dr. Lorin Picket, injection of b-12 given IM in right deltoid by Valentino Nose. Patient tolerated injection well.

## 2024-02-11 ENCOUNTER — Ambulatory Visit (INDEPENDENT_AMBULATORY_CARE_PROVIDER_SITE_OTHER): Payer: Medicare PPO

## 2024-02-11 DIAGNOSIS — E538 Deficiency of other specified B group vitamins: Secondary | ICD-10-CM | POA: Diagnosis not present

## 2024-02-11 MED ORDER — CYANOCOBALAMIN 1000 MCG/ML IJ SOLN
1000.0000 ug | Freq: Once | INTRAMUSCULAR | Status: AC
Start: 2024-02-11 — End: 2024-02-11
  Administered 2024-02-11: 1000 ug via INTRAMUSCULAR

## 2024-02-11 NOTE — Progress Notes (Signed)
 After obtaining consent, and per orders of Dr. Lorin Picket, injection of B-12 given IM in R Deltoid by Malen Gauze, CMA. Patient tolerated injection.

## 2024-03-05 DIAGNOSIS — R070 Pain in throat: Secondary | ICD-10-CM | POA: Diagnosis not present

## 2024-03-05 DIAGNOSIS — J019 Acute sinusitis, unspecified: Secondary | ICD-10-CM | POA: Diagnosis not present

## 2024-03-05 DIAGNOSIS — J301 Allergic rhinitis due to pollen: Secondary | ICD-10-CM | POA: Diagnosis not present

## 2024-03-13 ENCOUNTER — Ambulatory Visit

## 2024-03-14 ENCOUNTER — Ambulatory Visit

## 2024-03-14 DIAGNOSIS — E538 Deficiency of other specified B group vitamins: Secondary | ICD-10-CM | POA: Diagnosis not present

## 2024-03-14 MED ORDER — CYANOCOBALAMIN 1000 MCG/ML IJ SOLN
1000.0000 ug | Freq: Once | INTRAMUSCULAR | Status: AC
Start: 2024-03-14 — End: 2024-03-14
  Administered 2024-03-14: 1000 ug via INTRAMUSCULAR

## 2024-03-14 NOTE — Progress Notes (Signed)
 Pt received B12 injection in Left  deltoid muscle. Pt tolerated it well with no complaints or concerns.

## 2024-03-31 DIAGNOSIS — L304 Erythema intertrigo: Secondary | ICD-10-CM | POA: Diagnosis not present

## 2024-04-02 ENCOUNTER — Ambulatory Visit: Payer: Medicare PPO | Admitting: *Deleted

## 2024-04-02 VITALS — Ht 63.0 in | Wt 170.0 lb

## 2024-04-02 DIAGNOSIS — Z780 Asymptomatic menopausal state: Secondary | ICD-10-CM

## 2024-04-02 DIAGNOSIS — Z0000 Encounter for general adult medical examination without abnormal findings: Secondary | ICD-10-CM

## 2024-04-02 NOTE — Progress Notes (Signed)
 Will d/w her at her upcoming appt

## 2024-04-02 NOTE — Patient Instructions (Signed)
 Lisa Robles , Thank you for taking time to come for your Medicare Wellness Visit. I appreciate your ongoing commitment to your health goals. Please review the following plan we discussed and let me know if I can assist you in the future.   Referrals/Orders/Follow-Ups/Clinician Recommendations: Dexa/Bone density has been ordered. You have an order for:  []   2D Mammogram  []   3D Mammogram  [x]   Bone Density     Please call for appointment:  [facility]  [address] [place] [address] [phone]  Make sure to wear two-piece clothing.  No lotions, powders, or deodorants the day of the appointment. Make sure to bring picture ID and insurance card.  Bring list of medications you are currently taking including any supplements.    This is a list of the screening recommended for you and due dates:  Health Maintenance  Topic Date Due   Complete foot exam   09/29/2022   Eye exam for diabetics  10/05/2023   Yearly kidney health urinalysis for diabetes  03/14/2024   COVID-19 Vaccine (7 - 2024-25 season) 03/21/2024   Hemoglobin A1C  06/11/2024   Flu Shot  07/04/2024   Yearly kidney function blood test for diabetes  12/12/2024   Mammogram  01/06/2025   Medicare Annual Wellness Visit  04/02/2025   DTaP/Tdap/Td vaccine (3 - Td or Tdap) 02/27/2031   Pneumonia Vaccine  Completed   DEXA scan (bone density measurement)  Completed   Hepatitis C Screening  Completed   Zoster (Shingles) Vaccine  Completed   HPV Vaccine  Aged Out   Meningitis B Vaccine  Aged Out   Colon Cancer Screening  Discontinued    Advanced directives: (Declined) Advance directive discussed with you today. Even though you declined this today, please call our office should you change your mind, and we can give you the proper paperwork for you to fill out. Patient will work on this  Next [employer] Wellness Visit scheduled for next year: Yes 04/06/25 @ 3:00

## 2024-04-02 NOTE — Progress Notes (Signed)
 Subjective:   Lisa Robles is a 78 y.o. who presents for a Medicare Wellness preventive visit.  Visit Complete: Virtual I connected with  Lisa Robles on 04/02/24 by a audio enabled telemedicine application and verified that I am speaking with the correct person using two identifiers.  Patient Location: Home  Provider Location: Office/Clinic  I discussed the limitations of evaluation and management by telemedicine. The patient expressed understanding and agreed to proceed.  Vital Signs: Because this visit was a virtual/telehealth visit, some criteria may be missing or patient reported. Any vitals not documented were not able to be obtained and vitals that have been documented are patient reported.  VideoDeclined- This patient declined Librarian, academic. Therefore the visit was completed with audio only.  Persons Participating in Visit: Patient.  AWV Questionnaire: No: Patient Medicare AWV questionnaire was not completed prior to this visit.  Cardiac Risk Factors include: advanced age (>45men, >47 women);obesity (BMI >30kg/m2);dyslipidemia;hypertension     Objective:    Today's Vitals   04/02/24 1341  Weight: 170 lb (77.1 kg)  Height: 5\' 3"  (1.6 m)   Body mass index is 30.11 kg/m.     04/02/2024    1:54 PM 04/02/2023    2:25 PM 03/15/2022    9:35 AM 11/25/2021    9:30 AM 10/12/2021    9:58 AM 03/09/2021   10:45 AM 03/08/2020    2:16 PM  Advanced Directives  Does Patient Have a Medical Advance Directive? No No No Yes Yes Yes Yes  Type of Theme park manager;Living will Healthcare Power of [place];Living will Healthcare Power of [place];Living will Healthcare Power of [place];Living will  Does patient want to make changes to medical advance directive?     Yes (Inpatient - patient defers changing a medical advance directive and declines information at this time) No - Patient declined   Copy of Healthcare Power of  Attorney in Chart?      No - copy requested No - copy requested  Would patient like information on creating a medical advance directive? No - Patient declined No - Patient declined No - Patient declined        Current Medications (verified) Outpatient Encounter Medications as of 04/02/2024  Medication Sig   albuterol  (VENTOLIN  HFA) 108 (90 Base) MCG/ACT inhaler Inhale 1-2 puffs into the lungs every 6 (six) hours as needed for wheezing or shortness of breath.   azelastine  (ASTELIN ) 0.1 % nasal spray Place 1 spray into both nostrils in the morning and at bedtime.   docusate sodium  (COLACE) 100 MG capsule Take 1 capsule (100 mg total) by mouth every 12 (twelve) hours.   ezetimibe  (ZETIA ) 10 MG tablet Take 1 tablet (10 mg total) by mouth daily.   fluticasone  (FLONASE ) 50 MCG/ACT nasal spray Place 2 sprays into both nostrils daily.   guaiFENesin  (MUCINEX ) 600 MG 12 hr tablet Take 2 tablets (1,200 mg total) by mouth 2 (two) times daily as needed for cough or to loosen phlegm.   hydrochlorothiazide  (HYDRODIURIL ) 25 MG tablet Take 1 tablet (25 mg total) by mouth daily.   meclizine  (ANTIVERT ) 25 MG tablet Take 1 tablet (25 mg total) by mouth 3 (three) times daily as needed for dizziness.   nystatin  cream (MYCOSTATIN ) Apply 1 application topically 2 (two) times daily.   pantoprazole  (PROTONIX ) 40 MG tablet TAKE 1 TABLET(40 MG) BY MOUTH DAILY   pravastatin  (PRAVACHOL ) 40 MG tablet Take 1 tablet (40 mg total) by mouth daily.  Probiotic Product (ALIGN PO) Take by mouth.   No facility-administered encounter medications on file as of 04/02/2024.    Allergies (verified) Cinnamon, Bactrim [sulfamethoxazole-trimethoprim], and Shellfish allergy   History: Past Medical History:  Diagnosis Date   Diabetes mellitus without complication (HCC)    GERD (gastroesophageal reflux disease)    Hypercholesteremia    Hypertension    Past Surgical History:  Procedure Laterality Date   BREAST BIOPSY     TOOTH  EXTRACTION     tooth implantation   TUBAL LIGATION  1979   Family History  Problem Relation Age of Onset   Heart attack Mother    Heart attack Father    Hypercholesterolemia Brother    Diabetes Brother    Cancer Maternal Aunt        breast   Diabetes Maternal Grandmother    Schizophrenia Grandson    Colon cancer Neg Hx    Breast cancer Neg Hx    Social History   Socioeconomic History   Marital status: Widowed    Spouse name: Not on file   Number of children: 2   Years of education: masters   Highest education level: Not on file  Occupational History   Occupation: retired Runner, broadcasting/film/video  Tobacco Use   Smoking status: Former    Current packs/day: 0.00    Average packs/day: 1 pack/day for 7.0 years (7.0 ttl pk-yrs)    Types: Cigarettes    Start date: 12/04/1970    Quit date: 12/04/1977    Years since quitting: 46.3   Smokeless tobacco: Never  Vaping Use   Vaping status: Never Used  Substance and Sexual Activity   Alcohol use: No    Alcohol/week: 0.0 standard drinks of alcohol   Drug use: No   Sexual activity: Yes    Birth control/protection: None    Comment: Is sexually active wih same partner x 14 years. Recently found out partner has been cheating.  Other Topics Concern   Not on file  Social History Narrative   Not on file   Social Drivers of Health   Financial Resource Strain: Low Risk  (04/02/2024)   Overall Financial Resource Strain (CARDIA)    Difficulty of Paying Living Expenses: Not hard at all  Food Insecurity: No Food Insecurity (04/02/2024)   Hunger Vital Sign    Worried About Running Out of Food in the Last Year: Never true    Ran Out of Food in the Last Year: Never true  Transportation Needs: No Transportation Needs (04/02/2024)   PRAPARE - Administrator, Civil Service (Medical): No    Lack of Transportation (Non-Medical): No  Physical Activity: Sufficiently Active (04/02/2024)   Exercise Vital Sign    Days of Exercise per Week: 5 days     Minutes of Exercise per Session: 90 min  Stress: No Stress Concern Present (04/02/2024)   [employer] of Occupational Health - Occupational Stress Questionnaire    Feeling of Stress : Not at all  Social Connections: Moderately Integrated (04/02/2024)   Social Connection and Isolation Panel [NHANES]    Frequency of Communication with Friends and Family: More than three times a week    Frequency of Social Gatherings with Friends and Family: More than three times a week    Attends Religious Services: More than 4 times per year    Active Member of [employer] or Organizations: Yes    Attends Banker Meetings: More than 4 times per year    Marital Status:  Widowed    Tobacco Counseling Counseling given: Not Answered    Clinical Intake:  Pre-visit preparation completed: Yes  Pain : No/denies pain     BMI - recorded: 30.11 Nutritional Status: BMI > 30  Obese Nutritional Risks: None Diabetes: No  Lab Results  Component Value Date   HGBA1C 6.2 12/13/2023   HGBA1C 6.2 07/26/2023   HGBA1C 6.1 03/15/2023     How often do you need to have someone help you when you read instructions, pamphlets, or other written materials from your doctor or pharmacy?: 1 - Never  Interpreter Needed?: No  Information entered by :: R. Jerimy Johanson LPN   Activities of Daily Living     04/02/2024    1:43 PM  In your present state of health, do you have any difficulty performing the following activities:  Hearing? 0  Vision? 0  Difficulty concentrating or making decisions? 1  Comment remembering names  Walking or climbing stairs? 0  Dressing or bathing? 0  Doing errands, shopping? 0  Preparing Food and eating ? N  Using the Toilet? N  In the past six months, have you accidently leaked urine? N  Do you have problems with loss of bowel control? N  Managing your Medications? N  Managing your Finances? N  Housekeeping or managing your Housekeeping? N    Patient Care Team: Dellar Fenton,  MD as PCP - General (Internal Medicine) Constancia Delton, MD as PCP - Cardiology (Cardiology)  Indicate any recent Medical Services you may have received from other than [facility] providers in the past year (date may be approximate).     Assessment:   This is a routine wellness examination for Seva.  Hearing/Vision screen Hearing Screening - Comments:: No issues Vision Screening - Comments:: glasses   Goals Addressed             This Visit's Progress    Patient Stated       Wants to lose some weight and continue to exercise       Depression Screen     04/02/2024    1:49 PM 07/30/2023    1:14 PM 04/02/2023    2:28 PM 03/15/2022    9:37 AM 02/02/2022   10:47 AM 09/29/2021   10:47 AM 03/09/2021   10:44 AM  PHQ 2/9 Scores  PHQ - 2 Score 1 0 0 0 2 0 0  PHQ- 9 Score 3 1  0 4      Fall Risk     04/02/2024    1:45 PM 07/30/2023    1:13 PM 04/02/2023    2:28 PM 03/15/2022    9:36 AM 02/02/2022   10:47 AM  Fall Risk   Falls in the past year? 0 0 0    Number falls in past yr: 0 0 0    Injury with Fall? 0 0 0    Risk for fall due to : No Fall Risks No Fall Risks   History of fall(s)  Follow up Falls prevention discussed;Falls evaluation completed Falls evaluation completed Falls evaluation completed;Falls prevention discussed Falls evaluation completed Falls evaluation completed    MEDICARE RISK AT HOME:  Medicare Risk at Home Any stairs in or around the home?: No If so, are there any without handrails?: No Home free of loose throw rugs in walkways, pet beds, electrical cords, etc?: Yes Adequate lighting in your home to reduce risk of falls?: Yes Life alert?: No Use of a cane, walker or w/c?: No Grab bars in  the bathroom?: Yes Shower chair or bench in shower?: Yes Elevated toilet seat or a handicapped toilet?: No  TIMED UP AND GO:  Was the test performed?  No  Cognitive Function: 6CIT completed    01/18/2017    8:22 AM 01/19/2016    8:36 AM  MMSE - Mini Mental State  Exam  Orientation to time 5 5  Orientation to Place 5 5  Registration 3 3  Attention/ Calculation 5 5  Recall 3 3  Language- name 2 objects 2 2  Language- repeat 1 1  Language- follow 3 step command 3 3  Language- read & follow direction 1 1  Write a sentence 1 1  Copy design 1 1  Total score 30 30        04/02/2024    1:55 PM 04/02/2023    2:28 PM 03/08/2020    1:57 PM 03/05/2019    1:29 PM 01/21/2018    9:05 AM  6CIT Screen  What Year? 0 points 0 points 0 points 0 points 0 points  What month? 0 points 0 points 0 points 0 points 0 points  What time? 0 points 0 points 0 points 0 points 0 points  Count back from 20 0 points 0 points 0 points 0 points 0 points  Months in reverse 0 points 0 points 0 points 0 points 0 points  Repeat phrase 0 points 0 points 0 points 0 points 0 points  Total Score 0 points 0 points 0 points 0 points 0 points    Immunizations Immunization History  Administered Date(s) Administered   Fluad Quad(high Dose 65+) 08/28/2019, 09/22/2020, 09/12/2021   Influenza, High Dose Seasonal PF 08/17/2016, 08/22/2017, 08/28/2018, 09/03/2022   Influenza,inj,Quad PF,6+ Mos 07/29/2014   Influenza-Unspecified 09/01/2013, 09/09/2015, 09/06/2023   Moderna Covid-19 Fall Seasonal Vaccine 105yrs & older 09/21/2023   PFIZER(Purple Top)SARS-COV-2 Vaccination 12/10/2019, 12/31/2019, 09/03/2020, 04/22/2021   Pfizer Covid-19 Vaccine Bivalent Booster 26yrs & up 09/03/2022   Pneumococcal Conjugate-13 12/01/2015   Pneumococcal Polysaccharide-23 07/18/2011, 06/28/2017   Respiratory Syncytial Virus Vaccine,Recomb Aduvanted(Arexvy) 09/12/2022   Tdap 05/19/2011, 02/26/2021   Zoster Recombinant(Shingrix) 04/09/2017, 06/28/2017    Screening Tests Health Maintenance  Topic Date Due   FOOT EXAM  09/29/2022   OPHTHALMOLOGY EXAM  10/05/2023   Diabetic kidney evaluation - Urine ACR  03/14/2024   COVID-19 Vaccine (7 - 2024-25 season) 03/21/2024   Medicare Annual Wellness (AWV)   04/01/2024   HEMOGLOBIN A1C  06/11/2024   INFLUENZA VACCINE  07/04/2024   Diabetic kidney evaluation - eGFR measurement  12/12/2024   DTaP/Tdap/Td (3 - Td or Tdap) 02/27/2031   Pneumonia Vaccine 106+ Years old  Completed   DEXA SCAN  Completed   Hepatitis C Screening  Completed   Zoster Vaccines- Shingrix  Completed   HPV VACCINES  Aged Out   Meningococcal B Vaccine  Aged Out   Colonoscopy  Discontinued    Health Maintenance  Health Maintenance Due  Topic Date Due   FOOT EXAM  09/29/2022   OPHTHALMOLOGY EXAM  10/05/2023   Diabetic kidney evaluation - Urine ACR  03/14/2024   COVID-19 Vaccine (7 - 2024-25 season) 03/21/2024   Medicare Annual Wellness (AWV)  04/01/2024   Health Maintenance Items Addressed: DEXA ordered, Patient stated that she is borderline diabetic.   Additional Screening:  Vision Screening: Recommended annual ophthalmology exams for early detection of glaucoma and other disorders of the eye. Up to date [facility]  Dental Screening: Recommended annual dental exams for proper oral hygiene  Community Resource Referral / Chronic Care Management: CRR required this visit?  No   CCM required this visit?  No     Plan:     I have personally reviewed and noted the following in the patient's chart:   Medical and social history Use of alcohol, tobacco or illicit drugs  Current medications and supplements including opioid prescriptions. Patient is not currently taking opioid prescriptions. Functional ability and status Nutritional status Physical activity Advanced directives List of other physicians Hospitalizations, surgeries, and ER visits in previous 12 months Vitals Screenings to include cognitive, depression, and falls Referrals and appointments  In addition, I have reviewed and discussed with patient certain preventive protocols, quality metrics, and best practice recommendations. A written personalized care plan for preventive services as well  as general preventive health recommendations were provided to patient.     Felicitas Horse, LPN   08/17/7828   After Visit Summary: (MyChart) Due to this being a telephonic visit, the after visit summary with patients personalized plan was offered to patient via MyChart    Nurse Notes See routing comments

## 2024-04-10 DIAGNOSIS — G4733 Obstructive sleep apnea (adult) (pediatric): Secondary | ICD-10-CM | POA: Diagnosis not present

## 2024-04-11 ENCOUNTER — Other Ambulatory Visit: Payer: Medicare PPO

## 2024-04-14 ENCOUNTER — Ambulatory Visit

## 2024-04-14 ENCOUNTER — Ambulatory Visit (INDEPENDENT_AMBULATORY_CARE_PROVIDER_SITE_OTHER)

## 2024-04-14 DIAGNOSIS — E538 Deficiency of other specified B group vitamins: Secondary | ICD-10-CM | POA: Diagnosis not present

## 2024-04-14 MED ORDER — CYANOCOBALAMIN 1000 MCG/ML IJ SOLN
1000.0000 ug | Freq: Once | INTRAMUSCULAR | Status: AC
  Administered 2024-04-14: 1000 ug via INTRAMUSCULAR

## 2024-04-14 NOTE — Progress Notes (Signed)
 Pt presented for their vitamin B12 injection. Pt was identified through two identifiers. Pt tolerated shot well in their right deltoid.

## 2024-04-15 ENCOUNTER — Ambulatory Visit: Payer: Self-pay | Admitting: Internal Medicine

## 2024-04-15 ENCOUNTER — Ambulatory Visit

## 2024-04-15 ENCOUNTER — Other Ambulatory Visit

## 2024-04-15 DIAGNOSIS — E1165 Type 2 diabetes mellitus with hyperglycemia: Secondary | ICD-10-CM | POA: Diagnosis not present

## 2024-04-15 DIAGNOSIS — E7800 Pure hypercholesterolemia, unspecified: Secondary | ICD-10-CM

## 2024-04-15 LAB — HEPATIC FUNCTION PANEL
ALT: 12 U/L (ref 0–35)
AST: 17 U/L (ref 0–37)
Albumin: 4 g/dL (ref 3.5–5.2)
Alkaline Phosphatase: 63 U/L (ref 39–117)
Bilirubin, Direct: 0.1 mg/dL (ref 0.0–0.3)
Total Bilirubin: 0.6 mg/dL (ref 0.2–1.2)
Total Protein: 7.5 g/dL (ref 6.0–8.3)

## 2024-04-15 LAB — LIPID PANEL
Cholesterol: 166 mg/dL (ref 0–200)
HDL: 53.8 mg/dL (ref >39.00)
LDL Cholesterol: 93 mg/dL (ref 0–99)
NonHDL: 112.67
Total CHOL/HDL Ratio: 3
Triglycerides: 98 mg/dL (ref 0.0–149.0)
VLDL: 19.6 mg/dL (ref 0.0–40.0)

## 2024-04-15 LAB — BASIC METABOLIC PANEL WITH GFR
BUN: 12 mg/dL (ref 6–23)
CO2: 29 meq/L (ref 19–32)
Calcium: 10.1 mg/dL (ref 8.4–10.5)
Chloride: 102 meq/L (ref 96–112)
Creatinine, Ser: 0.78 mg/dL (ref 0.40–1.20)
GFR: 72.01 mL/min (ref >60.00)
Glucose, Bld: 114 mg/dL — ABNORMAL HIGH (ref 70–99)
Potassium: 3.9 meq/L (ref 3.5–5.1)
Sodium: 139 meq/L (ref 135–145)

## 2024-04-15 LAB — HEMOGLOBIN A1C: Hgb A1c MFr Bld: 6.1 % (ref 4.6–6.5)

## 2024-04-17 ENCOUNTER — Encounter: Payer: Self-pay | Admitting: Internal Medicine

## 2024-04-17 ENCOUNTER — Ambulatory Visit: Payer: Medicare PPO | Admitting: Internal Medicine

## 2024-04-17 VITALS — BP 118/70 | HR 74 | Temp 98.0°F | Resp 16 | Ht 63.0 in | Wt 186.4 lb

## 2024-04-17 DIAGNOSIS — E7800 Pure hypercholesterolemia, unspecified: Secondary | ICD-10-CM

## 2024-04-17 DIAGNOSIS — K219 Gastro-esophageal reflux disease without esophagitis: Secondary | ICD-10-CM

## 2024-04-17 DIAGNOSIS — G4733 Obstructive sleep apnea (adult) (pediatric): Secondary | ICD-10-CM

## 2024-04-17 DIAGNOSIS — E1165 Type 2 diabetes mellitus with hyperglycemia: Secondary | ICD-10-CM | POA: Diagnosis not present

## 2024-04-17 DIAGNOSIS — F439 Reaction to severe stress, unspecified: Secondary | ICD-10-CM

## 2024-04-17 DIAGNOSIS — D649 Anemia, unspecified: Secondary | ICD-10-CM | POA: Diagnosis not present

## 2024-04-17 DIAGNOSIS — I10 Essential (primary) hypertension: Secondary | ICD-10-CM | POA: Diagnosis not present

## 2024-04-17 DIAGNOSIS — E538 Deficiency of other specified B group vitamins: Secondary | ICD-10-CM | POA: Diagnosis not present

## 2024-04-17 LAB — HM DIABETES FOOT EXAM

## 2024-04-17 MED ORDER — HYDROCHLOROTHIAZIDE 25 MG PO TABS
25.0000 mg | ORAL_TABLET | Freq: Every day | ORAL | 3 refills | Status: AC
Start: 2024-04-17 — End: ?

## 2024-04-17 NOTE — Assessment & Plan Note (Signed)
 Low carb diet and exercise given elevated blood sugars. Follow met b and A1c.  Lab Results  Component Value Date   HGBA1C 6.1 04/15/2024

## 2024-04-17 NOTE — Assessment & Plan Note (Signed)
 Cholesterol improved with the addition of zetia . Continues on pravastatin  40mg . Continue current medications and diet and exercise.  Follow lipid panel.   Lab Results  Component Value Date   CHOL 166 04/15/2024   HDL 53.80 04/15/2024   LDLCALC 93 04/15/2024   LDLDIRECT 142.7 12/11/2013   TRIG 98.0 04/15/2024   CHOLHDL 3 04/15/2024

## 2024-04-17 NOTE — Progress Notes (Signed)
 Subjective:    Patient ID: Lisa Robles, female    DOB: 1946/07/10, 78 y.o.   MRN: [id number]  Patient here for  Chief Complaint  Patient presents with   Annual Exam    HPI Here for a scheduled follow up. Was scheduled for a physical, but is too early for physical exam. Appt changed to f/u appt. Continues using cpap for OSA. She is taking pravastatin  and last visit, added zetia . Discussed labs. Cholesterol improved. Tries to stay active. No chest pain or sob reported. No cough or congestion reported. No abdominal pain or bowel change. Increased stress - grandson's health issues. Overall appears to be handling things relatively well.    Past Medical History:  Diagnosis Date   Diabetes mellitus without complication (HCC)    GERD (gastroesophageal reflux disease)    Hypercholesteremia    Hypertension    Past Surgical History:  Procedure Laterality Date   BREAST BIOPSY     TOOTH EXTRACTION     tooth implantation   TUBAL LIGATION  1979   Family History  Problem Relation Age of Onset   Heart attack Mother    Heart attack Father    Hypercholesterolemia Brother    Diabetes Brother    Cancer Maternal Aunt        breast   Diabetes Maternal Grandmother    Schizophrenia Grandson    Colon cancer Neg Hx    Breast cancer Neg Hx    Social History   Socioeconomic History   Marital status: Widowed    Spouse name: Not on file   Number of children: 2   Years of education: masters   Highest education level: Not on file  Occupational History   Occupation: retired Runner, broadcasting/film/video  Tobacco Use   Smoking status: Former    Current packs/day: 0.00    Average packs/day: 1 pack/day for 7.0 years (7.0 ttl pk-yrs)    Types: Cigarettes    Start date: 12/04/1970    Quit date: 12/04/1977    Years since quitting: 46.4   Smokeless tobacco: Never  Vaping Use   Vaping status: Never Used  Substance and Sexual Activity   Alcohol use: No    Alcohol/week: 0.0 standard drinks of alcohol   Drug use: No    Sexual activity: Yes    Birth control/protection: None    Comment: Is sexually active wih same partner x 14 years. Recently found out partner has been cheating.  Other Topics Concern   Not on file  Social History Narrative   Not on file   Social Drivers of Health   Financial Resource Strain: Low Risk  (04/02/2024)   Overall Financial Resource Strain (CARDIA)    Difficulty of Paying Living Expenses: Not hard at all  Food Insecurity: No Food Insecurity (04/02/2024)   Hunger Vital Sign    Worried About Running Out of Food in the Last Year: Never true    Ran Out of Food in the Last Year: Never true  Transportation Needs: No Transportation Needs (04/02/2024)   PRAPARE - Administrator, Civil Service (Medical): No    Lack of Transportation (Non-Medical): No  Physical Activity: Sufficiently Active (04/02/2024)   Exercise Vital Sign    Days of Exercise per Week: 5 days    Minutes of Exercise per Session: 90 min  Stress: No Stress Concern Present (04/02/2024)   [employer] of Occupational Health - Occupational Stress Questionnaire    Feeling of Stress : Not at all  Social  Connections: Moderately Integrated (04/02/2024)   Social Connection and Isolation Panel [NHANES]    Frequency of Communication with Friends and Family: More than three times a week    Frequency of Social Gatherings with Friends and Family: More than three times a week    Attends Religious Services: More than 4 times per year    Active Member of [employer] or Organizations: Yes    Attends Banker Meetings: More than 4 times per year    Marital Status: Widowed     Review of Systems  Constitutional:  Negative for appetite change and unexpected weight change.  HENT:  Negative for congestion and sinus pressure.   Respiratory:  Negative for cough, chest tightness and shortness of breath.   Cardiovascular:  Negative for chest pain, palpitations and leg swelling.  Gastrointestinal:  Negative for  abdominal pain, diarrhea, nausea and vomiting.  Genitourinary:  Negative for difficulty urinating and dysuria.  Musculoskeletal:  Negative for joint swelling and myalgias.  Skin:  Negative for color change and rash.  Neurological:  Negative for dizziness and headaches.  Psychiatric/Behavioral:  Negative for agitation and dysphoric mood.        Increased stress as outlined.        Objective:     BP 118/70   Pulse 74   Temp 98 F (36.7 C)   Resp 16   Ht 5\' 3"  (1.6 m)   Wt 186 lb 6.4 oz (84.6 kg)   SpO2 97%   BMI 33.02 kg/m  Wt Readings from Last 3 Encounters:  04/17/24 186 lb 6.4 oz (84.6 kg)  04/02/24 170 lb (77.1 kg)  12/18/23 182 lb (82.6 kg)    Physical Exam Vitals reviewed.  Constitutional:      General: She is not in acute distress.    Appearance: Normal appearance. She is well-developed.  HENT:     Head: Normocephalic and atraumatic.     Right Ear: External ear normal.     Left Ear: External ear normal.     Mouth/Throat:     Pharynx: No oropharyngeal exudate or posterior oropharyngeal erythema.  Eyes:     General: No scleral icterus.       Right eye: No discharge.        Left eye: No discharge.     Conjunctiva/sclera: Conjunctivae normal.  Neck:     Thyroid : No thyromegaly.  Cardiovascular:     Rate and Rhythm: Normal rate and regular rhythm.  Pulmonary:     Effort: No tachypnea, accessory muscle usage or respiratory distress.     Breath sounds: Normal breath sounds. No decreased breath sounds or wheezing.  Chest:  Breasts:    Right: No inverted nipple, mass, nipple discharge or tenderness (no axillary adenopathy).     Left: No inverted nipple, mass, nipple discharge or tenderness (no axilarry adenopathy).  Abdominal:     General: Bowel sounds are normal.     Palpations: Abdomen is soft.     Tenderness: There is no abdominal tenderness.  Musculoskeletal:        General: No swelling or tenderness.     Cervical back: Neck supple.  Lymphadenopathy:      Cervical: No cervical adenopathy.  Skin:    Findings: No erythema or rash.  Neurological:     Mental Status: She is alert and oriented to person, place, and time.  Psychiatric:        Mood and Affect: Mood normal.        Behavior:  Behavior normal.      Diabetic foot exam was performed with the following findings:   No deformities, ulcerations, or other skin breakdown Normal sensation of 10g monofilament Intact posterior tibialis and dorsalis pedis pulses      Outpatient Encounter Medications as of 04/17/2024  Medication Sig   albuterol  (VENTOLIN  HFA) 108 (90 Base) MCG/ACT inhaler Inhale 1-2 puffs into the lungs every 6 (six) hours as needed for wheezing or shortness of breath.   azelastine  (ASTELIN ) 0.1 % nasal spray Place 1 spray into both nostrils in the morning and at bedtime.   docusate sodium  (COLACE) 100 MG capsule Take 1 capsule (100 mg total) by mouth every 12 (twelve) hours.   ezetimibe  (ZETIA ) 10 MG tablet Take 1 tablet (10 mg total) by mouth daily.   fluticasone  (FLONASE ) 50 MCG/ACT nasal spray Place 2 sprays into both nostrils daily.   guaiFENesin  (MUCINEX ) 600 MG 12 hr tablet Take 2 tablets (1,200 mg total) by mouth 2 (two) times daily as needed for cough or to loosen phlegm.   hydrochlorothiazide  (HYDRODIURIL ) 25 MG tablet Take 1 tablet (25 mg total) by mouth daily.   meclizine  (ANTIVERT ) 25 MG tablet Take 1 tablet (25 mg total) by mouth 3 (three) times daily as needed for dizziness.   nystatin  cream (MYCOSTATIN ) Apply 1 application topically 2 (two) times daily.   pantoprazole  (PROTONIX ) 40 MG tablet TAKE 1 TABLET(40 MG) BY MOUTH DAILY   pravastatin  (PRAVACHOL ) 40 MG tablet Take 1 tablet (40 mg total) by mouth daily.   Probiotic Product (ALIGN PO) Take by mouth.   [DISCONTINUED] hydrochlorothiazide  (HYDRODIURIL ) 25 MG tablet Take 1 tablet (25 mg total) by mouth daily.   No facility-administered encounter medications on file as of 04/17/2024.     Lab Results   Component Value Date   WBC 6.8 07/26/2023   HGB 13.3 07/26/2023   HCT 40.6 07/26/2023   PLT 302.0 07/26/2023   GLUCOSE 114 (H) 04/15/2024   CHOL 166 04/15/2024   TRIG 98.0 04/15/2024   HDL 53.80 04/15/2024   LDLDIRECT 142.7 12/11/2013   LDLCALC 93 04/15/2024   ALT 12 04/15/2024   AST 17 04/15/2024   NA 139 04/15/2024   K 3.9 04/15/2024   CL 102 04/15/2024   CREATININE 0.78 04/15/2024   BUN 12 04/15/2024   CO2 29 04/15/2024   TSH 1.85 12/13/2023   INR 0.9 11/25/2021   HGBA1C 6.1 04/15/2024   MICROALBUR <0.7 04/17/2024    MM 3D SCREENING MAMMOGRAM BILATERAL BREAST Result Date: 01/09/2024 CLINICAL DATA:  Screening. EXAM: DIGITAL SCREENING BILATERAL MAMMOGRAM WITH TOMOSYNTHESIS AND CAD TECHNIQUE: Bilateral screening digital craniocaudal and mediolateral oblique mammograms were obtained. Bilateral screening digital breast tomosynthesis was performed. The images were evaluated with computer-aided detection. COMPARISON:  Previous exam(s). ACR Breast Density Category c: The breasts are heterogeneously dense, which may obscure small masses. FINDINGS: There are no findings suspicious for malignancy. IMPRESSION: No mammographic evidence of malignancy. A result letter of this screening mammogram will be mailed directly to the patient. RECOMMENDATION: Screening mammogram in one year. (Code:SM-B-01Y) BI-RADS CATEGORY  1: Negative. Electronically Signed   By: Alger Infield M.D.   On: 01/09/2024 14:35       Assessment & Plan:  Anemia, unspecified type Assessment & Plan: Recheck cbc with next fasting labs.    Hypercholesterolemia Assessment & Plan: Cholesterol improved with the addition of zetia . Continues on pravastatin  40mg . Continue current medications and diet and exercise.  Follow lipid panel.   Lab Results  Component  Value Date   CHOL 166 04/15/2024   HDL 53.80 04/15/2024   LDLCALC 93 04/15/2024   LDLDIRECT 142.7 12/11/2013   TRIG 98.0 04/15/2024   CHOLHDL 3 04/15/2024      Orders: -     Lipid panel; Future -     Hepatic function panel; Future -     Basic metabolic panel with GFR; Future -     CBC with Differential/Platelet; Future  Type 2 diabetes mellitus with hyperglycemia, without long-term current use of insulin (HCC) Assessment & Plan: Low carb diet and exercise given elevated blood sugars. Follow met b and A1c.  Lab Results  Component Value Date   HGBA1C 6.1 04/15/2024    Orders: -     Hemoglobin A1c; Future -     Microalbumin / creatinine urine ratio  B12 deficiency Assessment & Plan: Check b12 level with next labs.    Gastroesophageal reflux disease, unspecified whether esophagitis present Assessment & Plan: Continue protonix . No upper symptoms reported.    OSA (obstructive sleep apnea) Assessment & Plan: CPAP.    Primary hypertension Assessment & Plan: Continue hydrochlorothiazide . Blood pressure as outlined. Follow pressures. Follow metabolic panel.    Stress Assessment & Plan: Increased stress as outlined. Discussed. Overall she feels she is handling things relatively well. Follow.  Instructed to notify me if she feels she needs any further intervention. Follow.    Other orders -     hydroCHLOROthiazide ; Take 1 tablet (25 mg total) by mouth daily.  Dispense: 90 tablet; Refill: 3     Dellar Fenton, MD

## 2024-04-18 LAB — MICROALBUMIN / CREATININE URINE RATIO
Creatinine,U: 41 mg/dL
Microalb Creat Ratio: UNDETERMINED mg/g (ref 0.0–30.0)
Microalb, Ur: <0.7 mg/dL

## 2024-04-20 ENCOUNTER — Encounter: Payer: Self-pay | Admitting: Internal Medicine

## 2024-04-20 NOTE — Assessment & Plan Note (Signed)
Continue protonix.  No upper symptoms reported.

## 2024-04-20 NOTE — Assessment & Plan Note (Signed)
 Increased stress as outlined. Discussed. Overall she feels she is handling things relatively well. Follow.  Instructed to notify me if she feels she needs any further intervention. Follow.

## 2024-04-20 NOTE — Assessment & Plan Note (Signed)
 CPAP.

## 2024-04-20 NOTE — Assessment & Plan Note (Signed)
 Check b12 level with next labs.

## 2024-04-20 NOTE — Assessment & Plan Note (Signed)
 Continue hydrochlorothiazide . Blood pressure as outlined. Follow pressures. Follow metabolic panel.

## 2024-04-20 NOTE — Assessment & Plan Note (Signed)
 Recheck cbc with next fasting labs.

## 2024-04-22 ENCOUNTER — Ambulatory Visit: Payer: Self-pay | Admitting: Internal Medicine

## 2024-04-24 ENCOUNTER — Ambulatory Visit: Admitting: Internal Medicine

## 2024-05-12 ENCOUNTER — Ambulatory Visit (INDEPENDENT_AMBULATORY_CARE_PROVIDER_SITE_OTHER)

## 2024-05-12 DIAGNOSIS — E538 Deficiency of other specified B group vitamins: Secondary | ICD-10-CM | POA: Diagnosis not present

## 2024-05-12 MED ORDER — CYANOCOBALAMIN 1000 MCG/ML IJ SOLN
1000.0000 ug | Freq: Once | INTRAMUSCULAR | Status: AC
  Administered 2024-05-12: 1000 ug via INTRAMUSCULAR

## 2024-05-12 NOTE — Progress Notes (Signed)
 Pt presented for their vitamin B12 injection. Pt was identified through two identifiers. Pt tolerated shot well in their left deltoid.

## 2024-05-22 LAB — AMB RESULTS CONSOLE CBG: Glucose: 124

## 2024-05-22 NOTE — Progress Notes (Signed)
 No recommendations at this time. LE ccma

## 2024-06-11 ENCOUNTER — Ambulatory Visit

## 2024-06-11 DIAGNOSIS — E538 Deficiency of other specified B group vitamins: Secondary | ICD-10-CM | POA: Diagnosis not present

## 2024-06-11 MED ORDER — CYANOCOBALAMIN 1000 MCG/ML IJ SOLN
1000.0000 ug | Freq: Once | INTRAMUSCULAR | Status: AC
  Administered 2024-06-11: 1000 ug via INTRAMUSCULAR

## 2024-06-11 NOTE — Progress Notes (Signed)
Pt received B12 injection in right deltoid muscle. Pt tolerated it well with no complaints or concerns.  

## 2024-07-09 ENCOUNTER — Ambulatory Visit

## 2024-07-09 DIAGNOSIS — E538 Deficiency of other specified B group vitamins: Secondary | ICD-10-CM

## 2024-07-09 MED ORDER — CYANOCOBALAMIN 1000 MCG/ML IJ SOLN
1000.0000 ug | Freq: Once | INTRAMUSCULAR | Status: AC
  Administered 2024-07-09: 1000 ug via INTRAMUSCULAR

## 2024-07-09 NOTE — Progress Notes (Signed)
Pt received B12 injection in Left deltoid. Pt tolerated it well with no complaints or concerns.

## 2024-07-16 DIAGNOSIS — G4733 Obstructive sleep apnea (adult) (pediatric): Secondary | ICD-10-CM | POA: Diagnosis not present

## 2024-08-01 ENCOUNTER — Telehealth (INDEPENDENT_AMBULATORY_CARE_PROVIDER_SITE_OTHER): Admitting: Internal Medicine

## 2024-08-01 ENCOUNTER — Encounter: Payer: Self-pay | Admitting: Internal Medicine

## 2024-08-01 VITALS — BP 147/88 | HR 75 | Ht 63.0 in | Wt 179.0 lb

## 2024-08-01 DIAGNOSIS — I10 Essential (primary) hypertension: Secondary | ICD-10-CM

## 2024-08-01 DIAGNOSIS — N898 Other specified noninflammatory disorders of vagina: Secondary | ICD-10-CM | POA: Diagnosis not present

## 2024-08-01 DIAGNOSIS — E1165 Type 2 diabetes mellitus with hyperglycemia: Secondary | ICD-10-CM | POA: Diagnosis not present

## 2024-08-01 MED ORDER — NYSTATIN 100000 UNIT/GM EX CREA: 1.0000 | TOPICAL_CREAM | Freq: Two times a day (BID) | CUTANEOUS | 0 refills | Status: AC

## 2024-08-01 NOTE — Progress Notes (Signed)
 Patient ID: Lisa Robles, female   DOB: 1946-03-26, 78 y.o.   MRN: [id number]   Virtual Visit via video Note  I connected with Ronnald Bihari by a video enabled telemedicine application or telephone and verified that I am speaking with the correct person using two identifiers. Location patient: home Location provider: work Persons participating in the virtual visit: patient, provider  The limitations, risks, security and privacy concerns of performing an evaluation and management service by video and the availability of in person appointments have been discussed. It has also been discussed with the patient that there may be a patient responsible charge related to this service. The patient expressed understanding and agreed to proceed.   Reason for visit: work in appt  HPI: Work in to discuss some concerns regarding persistent irritation - vaginal area/groin. Saw dermatology. Has used cortisporin and a powder and spray. Still with persistent irritation. Discussed heat and moisture aggravates. No vaginal discharge and no intravaginal problems. No abdominal pain. Request referral to gyn.    ROS: See pertinent positives and negatives per HPI.  Past Medical History:  Diagnosis Date   Diabetes mellitus without complication (HCC)    GERD (gastroesophageal reflux disease)    Hypercholesteremia    Hypertension     Past Surgical History:  Procedure Laterality Date   BREAST BIOPSY     TOOTH EXTRACTION     tooth implantation   TUBAL LIGATION  1979    Family History  Problem Relation Age of Onset   Heart attack Mother    Heart attack Father    Hypercholesterolemia Brother    Diabetes Brother    Cancer Maternal Aunt        breast   Diabetes Maternal Grandmother    Schizophrenia Grandson    Colon cancer Neg Hx    Breast cancer Neg Hx     SOCIAL HX: reviewd.    Current Outpatient Medications:    albuterol  (VENTOLIN  HFA) 108 (90 Base) MCG/ACT inhaler, Inhale 1-2 puffs into the  lungs every 6 (six) hours as needed for wheezing or shortness of breath., Disp: 1 each, Rfl: 0   azelastine  (ASTELIN ) 0.1 % nasal spray, Place 1 spray into both nostrils in the morning and at bedtime., Disp: 30 mL, Rfl: 1   docusate sodium  (COLACE) 100 MG capsule, Take 1 capsule (100 mg total) by mouth every 12 (twelve) hours., Disp: 60 capsule, Rfl: 0   ezetimibe  (ZETIA ) 10 MG tablet, Take 1 tablet (10 mg total) by mouth daily., Disp: 30 tablet, Rfl: 3   fluticasone  (FLONASE ) 50 MCG/ACT nasal spray, Place 2 sprays into both nostrils daily., Disp: 16 g, Rfl: 1   guaiFENesin  (MUCINEX ) 600 MG 12 hr tablet, Take 2 tablets (1,200 mg total) by mouth 2 (two) times daily as needed for cough or to loosen phlegm., Disp: 20 tablet, Rfl: 0   hydrochlorothiazide  (HYDRODIURIL ) 25 MG tablet, Take 1 tablet (25 mg total) by mouth daily., Disp: 90 tablet, Rfl: 3   meclizine  (ANTIVERT ) 25 MG tablet, Take 1 tablet (25 mg total) by mouth 3 (three) times daily as needed for dizziness., Disp: 30 tablet, Rfl: 0   pantoprazole  (PROTONIX ) 40 MG tablet, TAKE 1 TABLET(40 MG) BY MOUTH DAILY, Disp: 30 tablet, Rfl: 2   pravastatin  (PRAVACHOL ) 40 MG tablet, Take 1 tablet (40 mg total) by mouth daily., Disp: 90 tablet, Rfl: 3   Probiotic Product (ALIGN PO), Take by mouth., Disp: , Rfl:    nystatin  cream (MYCOSTATIN ), Apply 1 Application topically  2 (two) times daily., Disp: 60 g, Rfl: 0  EXAM:  GENERAL: alert, oriented, appears well and in no acute distress  HEENT: atraumatic, conjunttiva clear, no obvious abnormalities on inspection of external nose and ears  NECK: normal movements of the head and neck  LUNGS: on inspection no signs of respiratory distress, breathing rate appears normal, no obvious gross SOB, gasping or wheezing  CV: no obvious cyanosis  PSYCH/NEURO: pleasant and cooperative, no obvious depression or anxiety, speech and thought processing grossly intact  ASSESSMENT AND PLAN:  Discussed the following  assessment and plan:  Problem List Items Addressed This Visit     Vaginal irritation - Primary   Persistent despite treatment from dermatology. Worsens with heat, moisture. Limited exam due to virtual visit. Trial of nystatin  cream. Request referral to gyn.       Relevant Orders   Ambulatory referral to Gynecology   Type 2 diabetes mellitus with hyperglycemia (HCC)   Low carb diet and exercise given elevated blood sugars. Follow met b and A1c.  Lab Results  Component Value Date   HGBA1C 6.1 04/15/2024        Hypertension   Has been controlled. Elevated today. Follow. Continue hydrochlorothiazide .        Return in about 7 weeks (around 09/19/2024) for physical with fasting labs 2 days prior.   I discussed the assessment and treatment plan with the patient. The patient was provided an opportunity to ask questions and all were answered. The patient agreed with the plan and demonstrated an understanding of the instructions.   The patient was advised to call back or seek an in-person evaluation if the symptoms worsen or if the condition fails to improve as anticipated.    Allena Hamilton, MD

## 2024-08-03 ENCOUNTER — Encounter: Payer: Self-pay | Admitting: Internal Medicine

## 2024-08-03 NOTE — Assessment & Plan Note (Signed)
 Has been controlled. Elevated today. Follow. Continue hydrochlorothiazide .

## 2024-08-03 NOTE — Assessment & Plan Note (Signed)
 Low carb diet and exercise given elevated blood sugars. Follow met b and A1c.  Lab Results  Component Value Date   HGBA1C 6.1 04/15/2024

## 2024-08-03 NOTE — Assessment & Plan Note (Signed)
 Persistent despite treatment from dermatology. Worsens with heat, moisture. Limited exam due to virtual visit. Trial of nystatin  cream. Request referral to gyn.

## 2024-08-05 ENCOUNTER — Telehealth: Payer: Self-pay

## 2024-08-05 NOTE — Telephone Encounter (Signed)
 We received a message from Dr. Allena Hamilton stating she would like for us  to schedule an appointment for patient to have physical with her in 6-8 weeks and fasting labs 1-2 days prior to her visit.  I left a voicemail for patient asking her to please return our call to schedule these appointments.  E2C2 - when patient calls back, please transfer call to our office for scheduling.  Please ask for Luke or Darice.

## 2024-08-05 NOTE — Telephone Encounter (Signed)
 NOTED

## 2024-08-11 ENCOUNTER — Ambulatory Visit (INDEPENDENT_AMBULATORY_CARE_PROVIDER_SITE_OTHER)

## 2024-08-11 DIAGNOSIS — Z23 Encounter for immunization: Secondary | ICD-10-CM

## 2024-08-11 DIAGNOSIS — E538 Deficiency of other specified B group vitamins: Secondary | ICD-10-CM | POA: Diagnosis not present

## 2024-08-11 MED ORDER — CYANOCOBALAMIN 1000 MCG/ML IJ SOLN
1000.0000 ug | Freq: Once | INTRAMUSCULAR | Status: AC
  Administered 2024-08-11: 1000 ug via INTRAMUSCULAR

## 2024-08-11 NOTE — Progress Notes (Signed)
 Pt received B12 injection in Left deltoid muscle. Pt tolerated it well with no complaints or concerns.    Pt received Flu vaccination injection in right deltoid muscle. Pt tolerated it well with no complaints or concerns.

## 2024-09-08 ENCOUNTER — Ambulatory Visit (INDEPENDENT_AMBULATORY_CARE_PROVIDER_SITE_OTHER)

## 2024-09-08 DIAGNOSIS — E538 Deficiency of other specified B group vitamins: Secondary | ICD-10-CM | POA: Diagnosis not present

## 2024-09-08 MED ORDER — CYANOCOBALAMIN 1000 MCG/ML IJ SOLN
1000.0000 ug | Freq: Once | INTRAMUSCULAR | Status: AC
  Administered 2024-09-08: 1000 ug via INTRAMUSCULAR

## 2024-09-08 NOTE — Progress Notes (Signed)
 Pt presented for their vitamin B12 injection. Pt was identified through two identifiers. Pt tolerated shot well in their right deltoid.

## 2024-09-10 ENCOUNTER — Ambulatory Visit

## 2024-10-01 ENCOUNTER — Other Ambulatory Visit (INDEPENDENT_AMBULATORY_CARE_PROVIDER_SITE_OTHER)

## 2024-10-01 DIAGNOSIS — E7800 Pure hypercholesterolemia, unspecified: Secondary | ICD-10-CM

## 2024-10-01 DIAGNOSIS — E1165 Type 2 diabetes mellitus with hyperglycemia: Secondary | ICD-10-CM | POA: Diagnosis not present

## 2024-10-01 LAB — HEPATIC FUNCTION PANEL
ALT: 12 U/L (ref 0–35)
AST: 17 U/L (ref 0–37)
Albumin: 4 g/dL (ref 3.5–5.2)
Alkaline Phosphatase: 63 U/L (ref 39–117)
Bilirubin, Direct: 0.1 mg/dL (ref 0.0–0.3)
Total Bilirubin: 0.5 mg/dL (ref 0.2–1.2)
Total Protein: 7.5 g/dL (ref 6.0–8.3)

## 2024-10-01 LAB — BASIC METABOLIC PANEL WITH GFR
BUN: 13 mg/dL (ref 6–23)
CO2: 29 meq/L (ref 19–32)
Calcium: 9.8 mg/dL (ref 8.4–10.5)
Chloride: 102 meq/L (ref 96–112)
Creatinine, Ser: 0.79 mg/dL (ref 0.40–1.20)
GFR: 70.68 mL/min (ref >60.00)
Glucose, Bld: 118 mg/dL — ABNORMAL HIGH (ref 70–99)
Potassium: 3.7 meq/L (ref 3.5–5.1)
Sodium: 137 meq/L (ref 135–145)

## 2024-10-01 LAB — CBC WITH DIFFERENTIAL/PLATELET
Basophils Absolute: 0 K/uL (ref 0.0–0.1)
Basophils Relative: 0.3 % (ref 0.0–3.0)
Eosinophils Absolute: 0.1 K/uL (ref 0.0–0.7)
Eosinophils Relative: 1.8 % (ref 0.0–5.0)
HCT: 39.5 % (ref 36.0–46.0)
Hemoglobin: 13.2 g/dL (ref 12.0–15.0)
Lymphocytes Relative: 44 % (ref 12.0–46.0)
Lymphs Abs: 3 K/uL (ref 0.7–4.0)
MCHC: 33.3 g/dL (ref 30.0–36.0)
MCV: 93.4 fl (ref 78.0–100.0)
Monocytes Absolute: 0.8 K/uL (ref 0.1–1.0)
Monocytes Relative: 11.6 % (ref 3.0–12.0)
Neutro Abs: 2.9 K/uL (ref 1.4–7.7)
Neutrophils Relative %: 42.3 % — ABNORMAL LOW (ref 43.0–77.0)
Platelets: 298 K/uL (ref 150.0–400.0)
RBC: 4.23 Mil/uL (ref 3.87–5.11)
RDW: 14.5 % (ref 11.5–15.5)
WBC: 6.9 K/uL (ref 4.0–10.5)

## 2024-10-01 LAB — LIPID PANEL
Cholesterol: 198 mg/dL (ref 0–200)
HDL: 58.7 mg/dL (ref >39.00)
LDL Cholesterol: 122 mg/dL — ABNORMAL HIGH (ref 0–99)
NonHDL: 139.46
Total CHOL/HDL Ratio: 3
Triglycerides: 86 mg/dL (ref 0.0–149.0)
VLDL: 17.2 mg/dL (ref 0.0–40.0)

## 2024-10-01 LAB — HEMOGLOBIN A1C: Hgb A1c MFr Bld: 6.3 % (ref 4.6–6.5)

## 2024-10-07 ENCOUNTER — Ambulatory Visit: Admitting: Internal Medicine

## 2024-10-07 ENCOUNTER — Encounter: Payer: Self-pay | Admitting: Internal Medicine

## 2024-10-07 VITALS — BP 130/80 | HR 72 | Temp 98.0°F | Ht 63.0 in | Wt 185.4 lb

## 2024-10-07 DIAGNOSIS — G4733 Obstructive sleep apnea (adult) (pediatric): Secondary | ICD-10-CM | POA: Diagnosis not present

## 2024-10-07 DIAGNOSIS — R0981 Nasal congestion: Secondary | ICD-10-CM

## 2024-10-07 DIAGNOSIS — K219 Gastro-esophageal reflux disease without esophagitis: Secondary | ICD-10-CM

## 2024-10-07 DIAGNOSIS — E1165 Type 2 diabetes mellitus with hyperglycemia: Secondary | ICD-10-CM | POA: Diagnosis not present

## 2024-10-07 DIAGNOSIS — I10 Essential (primary) hypertension: Secondary | ICD-10-CM | POA: Diagnosis not present

## 2024-10-07 DIAGNOSIS — J4520 Mild intermittent asthma, uncomplicated: Secondary | ICD-10-CM

## 2024-10-07 DIAGNOSIS — Z0000 Encounter for general adult medical examination without abnormal findings: Secondary | ICD-10-CM

## 2024-10-07 DIAGNOSIS — E7800 Pure hypercholesterolemia, unspecified: Secondary | ICD-10-CM | POA: Diagnosis not present

## 2024-10-07 MED ORDER — EZETIMIBE 10 MG PO TABS
10.0000 mg | ORAL_TABLET | Freq: Every day | ORAL | 1 refills | Status: AC
Start: 2024-10-07 — End: ?

## 2024-10-07 NOTE — Patient Instructions (Signed)
 Flonase  2 sprays each nostril one time per day. Do this in the evening.

## 2024-10-07 NOTE — Progress Notes (Signed)
 Subjective:    Patient ID: Carlo Bihari, female    DOB: 1946/08/06, 78 y.o.   MRN: [id number]  Patient here for  Chief Complaint  Patient presents with   Annual Exam    CPE & review labs     HPI Here for a physical exam. Continue cpap for OSA. Continue protonix . No acid reflux reported. Reported - last week - increased congestion - sinus and chest. Some increased drainage. No sore throat. Last week - started allegra. Feeling better. No chest pain or sob. Some issues with constipation. Taking colace. Last bowel movement this am.    Past Medical History:  Diagnosis Date   Diabetes mellitus without complication (HCC)    GERD (gastroesophageal reflux disease)    Hypercholesteremia    Hypertension    Past Surgical History:  Procedure Laterality Date   BREAST BIOPSY     TOOTH EXTRACTION     tooth implantation   TUBAL LIGATION  1979   Family History  Problem Relation Age of Onset   Heart attack Mother    Heart attack Father    Hypercholesterolemia Brother    Diabetes Brother    Cancer Maternal Aunt        breast   Diabetes Maternal Grandmother    Schizophrenia Grandson    Colon cancer Neg Hx    Breast cancer Neg Hx    Social History   Socioeconomic History   Marital status: Widowed    Spouse name: Not on file   Number of children: 2   Years of education: masters   Highest education level: Not on file  Occupational History   Occupation: retired runner, broadcasting/film/video  Tobacco Use   Smoking status: Former    Current packs/day: 0.00    Average packs/day: 1 pack/day for 7.0 years (7.0 ttl pk-yrs)    Types: Cigarettes    Start date: 12/04/1970    Quit date: 12/04/1977    Years since quitting: 46.8   Smokeless tobacco: Never  Vaping Use   Vaping status: Never Used  Substance and Sexual Activity   Alcohol use: No    Alcohol/week: 0.0 standard drinks of alcohol   Drug use: No   Sexual activity: Yes    Birth control/protection: None    Comment: Is sexually active wih same  partner x 14 years. Recently found out partner has been cheating.  Other Topics Concern   Not on file  Social History Narrative   Not on file   Social Drivers of Health   Financial Resource Strain: Low Risk  (04/02/2024)   Overall Financial Resource Strain (CARDIA)    Difficulty of Paying Living Expenses: Not hard at all  Food Insecurity: No Food Insecurity (04/02/2024)   Hunger Vital Sign    Worried About Running Out of Food in the Last Year: Never true    Ran Out of Food in the Last Year: Never true  Transportation Needs: No Transportation Needs (04/02/2024)   PRAPARE - Administrator, Civil Service (Medical): No    Lack of Transportation (Non-Medical): No  Physical Activity: Sufficiently Active (04/02/2024)   Exercise Vital Sign    Days of Exercise per Week: 5 days    Minutes of Exercise per Session: 90 min  Stress: No Stress Concern Present (04/02/2024)   [employer] of Occupational Health - Occupational Stress Questionnaire    Feeling of Stress : Not at all  Social Connections: Moderately Integrated (04/02/2024)   Social Connection and Isolation Panel  Frequency of Communication with Friends and Family: More than three times a week    Frequency of Social Gatherings with Friends and Family: More than three times a week    Attends Religious Services: More than 4 times per year    Active Member of [employer] or Organizations: Yes    Attends Banker Meetings: More than 4 times per year    Marital Status: Widowed     Review of Systems  Constitutional:  Negative for appetite change, fever and unexpected weight change.  HENT:  Positive for congestion and postnasal drip.   Respiratory:  Negative for chest tightness and shortness of breath.        No significant cough now.   Cardiovascular:  Negative for chest pain and palpitations.  Gastrointestinal:  Negative for abdominal pain, diarrhea, nausea and vomiting.  Genitourinary:  Negative for difficulty  urinating and dysuria.  Musculoskeletal:  Negative for joint swelling and myalgias.  Skin:  Negative for color change and rash.  Neurological:  Negative for dizziness and headaches.  Psychiatric/Behavioral:  Negative for agitation and dysphoric mood.        Objective:     BP 130/80   Pulse 72   Temp 98 F (36.7 C) (Oral)   Ht 5' 3 (1.6 m)   Wt 185 lb 6 oz (84.1 kg)   SpO2 97%   BMI 32.84 kg/m  Wt Readings from Last 3 Encounters:  10/07/24 185 lb 6 oz (84.1 kg)  08/01/24 179 lb (81.2 kg)  04/17/24 186 lb 6.4 oz (84.6 kg)    Physical Exam Vitals reviewed.  Constitutional:      General: She is not in acute distress.    Appearance: Normal appearance.  HENT:     Head: Normocephalic and atraumatic.     Right Ear: External ear normal.     Left Ear: External ear normal.     Mouth/Throat:     Pharynx: No oropharyngeal exudate or posterior oropharyngeal erythema.  Eyes:     General: No scleral icterus.       Right eye: No discharge.        Left eye: No discharge.     Conjunctiva/sclera: Conjunctivae normal.  Neck:     Thyroid : No thyromegaly.  Cardiovascular:     Rate and Rhythm: Normal rate and regular rhythm.  Pulmonary:     Effort: No respiratory distress.     Breath sounds: Normal breath sounds. No wheezing.  Abdominal:     General: Bowel sounds are normal.     Palpations: Abdomen is soft.     Tenderness: There is no abdominal tenderness.  Musculoskeletal:        General: No swelling or tenderness.     Cervical back: Neck supple. No tenderness.  Lymphadenopathy:     Cervical: No cervical adenopathy.  Skin:    Findings: No erythema or rash.  Neurological:     Mental Status: She is alert.  Psychiatric:        Mood and Affect: Mood normal.        Behavior: Behavior normal.         Outpatient Encounter Medications as of 10/07/2024  Medication Sig   azelastine  (ASTELIN ) 0.1 % nasal spray Place 1 spray into both nostrils in the morning and at bedtime.    docusate sodium  (COLACE) 100 MG capsule Take 1 capsule (100 mg total) by mouth every 12 (twelve) hours.   fluticasone  (FLONASE ) 50 MCG/ACT nasal spray Place 2 sprays  into both nostrils daily.   guaiFENesin  (MUCINEX ) 600 MG 12 hr tablet Take 2 tablets (1,200 mg total) by mouth 2 (two) times daily as needed for cough or to loosen phlegm.   hydrochlorothiazide  (HYDRODIURIL ) 25 MG tablet Take 1 tablet (25 mg total) by mouth daily.   meclizine  (ANTIVERT ) 25 MG tablet Take 1 tablet (25 mg total) by mouth 3 (three) times daily as needed for dizziness.   nystatin  cream (MYCOSTATIN ) Apply 1 Application topically 2 (two) times daily.   pantoprazole  (PROTONIX ) 40 MG tablet TAKE 1 TABLET(40 MG) BY MOUTH DAILY   pravastatin  (PRAVACHOL ) 40 MG tablet Take 1 tablet (40 mg total) by mouth daily.   Probiotic Product (ALIGN PO) Take by mouth.   [DISCONTINUED] albuterol  (VENTOLIN  HFA) 108 (90 Base) MCG/ACT inhaler Inhale 1-2 puffs into the lungs every 6 (six) hours as needed for wheezing or shortness of breath.   ezetimibe  (ZETIA ) 10 MG tablet Take 1 tablet (10 mg total) by mouth daily.   [DISCONTINUED] ezetimibe  (ZETIA ) 10 MG tablet Take 1 tablet (10 mg total) by mouth daily.   No facility-administered encounter medications on file as of 10/07/2024.     Lab Results  Component Value Date   WBC 6.9 10/01/2024   HGB 13.2 10/01/2024   HCT 39.5 10/01/2024   PLT 298.0 10/01/2024   GLUCOSE 118 (H) 10/01/2024   CHOL 198 10/01/2024   TRIG 86.0 10/01/2024   HDL 58.70 10/01/2024   LDLDIRECT 142.7 12/11/2013   LDLCALC 122 (H) 10/01/2024   ALT 12 10/01/2024   AST 17 10/01/2024   NA 137 10/01/2024   K 3.7 10/01/2024   CL 102 10/01/2024   CREATININE 0.79 10/01/2024   BUN 13 10/01/2024   CO2 29 10/01/2024   TSH 1.85 12/13/2023   INR 0.9 11/25/2021   HGBA1C 6.3 10/01/2024   MICROALBUR <0.7 04/17/2024    MM 3D SCREENING MAMMOGRAM BILATERAL BREAST Result Date: 01/09/2024 CLINICAL DATA:  Screening. EXAM: DIGITAL  SCREENING BILATERAL MAMMOGRAM WITH TOMOSYNTHESIS AND CAD TECHNIQUE: Bilateral screening digital craniocaudal and mediolateral oblique mammograms were obtained. Bilateral screening digital breast tomosynthesis was performed. The images were evaluated with computer-aided detection. COMPARISON:  Previous exam(s). ACR Breast Density Category c: The breasts are heterogeneously dense, which may obscure small masses. FINDINGS: There are no findings suspicious for malignancy. IMPRESSION: No mammographic evidence of malignancy. A result letter of this screening mammogram will be mailed directly to the patient. RECOMMENDATION: Screening mammogram in one year. (Code:SM-B-01Y) BI-RADS CATEGORY  1: Negative. Electronically Signed   By: Delon Music M.D.   On: 01/09/2024 14:35       Assessment & Plan:  Routine general medical examination at a health care facility  Hypercholesterolemia Assessment & Plan: Cholesterol previously improved with the addition of zetia . Continues on pravastatin  40mg .  Not taking zetia  now. Restart. Follow lipid panel.   Lab Results  Component Value Date   CHOL 198 10/01/2024   HDL 58.70 10/01/2024   LDLCALC 122 (H) 10/01/2024   LDLDIRECT 142.7 12/11/2013   TRIG 86.0 10/01/2024   CHOLHDL 3 10/01/2024     Orders: -     Lipid panel; Future -     Hepatic function panel; Future -     Basic metabolic panel with GFR; Future -     TSH; Future  Type 2 diabetes mellitus with hyperglycemia, without long-term current use of insulin (HCC) Assessment & Plan: Low carb diet and exercise given elevated blood sugars. Follow met b and A1c.  Lab  Results  Component Value Date   HGBA1C 6.3 10/01/2024    Orders: -     Hemoglobin A1c; Future  OSA (obstructive sleep apnea) Assessment & Plan: Continue cpap.    Primary hypertension Assessment & Plan: Has been controlled. Follow. Continue hydrochlorothiazide . Follow metabolic panel.    Gastroesophageal reflux disease, unspecified  whether esophagitis present Assessment & Plan: No upper symptoms reported. Continue protonix .    Mild intermittent asthma without complication Assessment & Plan: Breathing stable.    Nasal congestion Assessment & Plan: Nasal congestion/post nasal drainage. Started allegra. Flonase  as directed. Follow. Call with update.    Healthcare maintenance Assessment & Plan: Physical today - 10/07/24. Mammogram 01/07/24 - birads I. Colonoscopy 2018.    Other orders -     Ezetimibe ; Take 1 tablet (10 mg total) by mouth daily.  Dispense: 90 tablet; Refill: 1     Allena Hamilton, MD

## 2024-10-09 ENCOUNTER — Ambulatory Visit

## 2024-10-10 ENCOUNTER — Other Ambulatory Visit: Payer: Self-pay | Admitting: Internal Medicine

## 2024-10-10 DIAGNOSIS — J4520 Mild intermittent asthma, uncomplicated: Secondary | ICD-10-CM

## 2024-10-10 NOTE — Telephone Encounter (Signed)
 Copied from CRM [id number]. Topic: Clinical - Medication Refill >> Oct 10, 2024 12:02 PM Aisha D wrote: Medication: albuterol  (VENTOLIN  HFA) 108 (90 Base) MCG/ACT inhaler  Has the patient contacted their pharmacy? Yes (Agent: If no, request that the patient contact the pharmacy for the refill. If patient does not wish to contact the pharmacy document the reason why and proceed with request.) (Agent: If yes, when and what did the pharmacy advise?)  This is the patient's preferred pharmacy:   [id number] [place], [place] - [address] AT [facility] OF [address] [place] [address] Phone: [phone] Fax: [phone]   Is this the correct pharmacy for this prescription? Yes If no, delete pharmacy and type the correct one.   Has the prescription been filled recently? No  Is the patient out of the medication? Yes  Has the patient been seen for an appointment in the last year OR does the patient have an upcoming appointment? Yes  Can we respond through MyChart? Yes  Agent: Please be advised that Rx refills may take up to 3 business days. We ask that you follow-up with your pharmacy.

## 2024-10-11 MED ORDER — ALBUTEROL SULFATE HFA 108 (90 BASE) MCG/ACT IN AERS
1.0000 | INHALATION_SPRAY | Freq: Four times a day (QID) | RESPIRATORY_TRACT | 1 refills | Status: AC | PRN
Start: 2024-10-11 — End: ?

## 2024-10-11 NOTE — Telephone Encounter (Signed)
Rx ok'd for albuterol inhaler.

## 2024-10-12 ENCOUNTER — Encounter: Payer: Self-pay | Admitting: Internal Medicine

## 2024-10-12 DIAGNOSIS — R0981 Nasal congestion: Secondary | ICD-10-CM | POA: Insufficient documentation

## 2024-10-12 NOTE — Assessment & Plan Note (Signed)
 No upper symptoms reported.  Continue protonix.

## 2024-10-12 NOTE — Assessment & Plan Note (Signed)
 Breathing stable.

## 2024-10-12 NOTE — Assessment & Plan Note (Signed)
 Has been controlled. Follow. Continue hydrochlorothiazide . Follow metabolic panel.

## 2024-10-12 NOTE — Assessment & Plan Note (Signed)
 Low carb diet and exercise given elevated blood sugars. Follow met b and A1c.  Lab Results  Component Value Date   HGBA1C 6.3 10/01/2024

## 2024-10-12 NOTE — Assessment & Plan Note (Signed)
 Cholesterol previously improved with the addition of zetia . Continues on pravastatin  40mg .  Not taking zetia  now. Restart. Follow lipid panel.   Lab Results  Component Value Date   CHOL 198 10/01/2024   HDL 58.70 10/01/2024   LDLCALC 122 (H) 10/01/2024   LDLDIRECT 142.7 12/11/2013   TRIG 86.0 10/01/2024   CHOLHDL 3 10/01/2024

## 2024-10-12 NOTE — Assessment & Plan Note (Signed)
 Continue cpap.

## 2024-10-12 NOTE — Assessment & Plan Note (Signed)
 Physical today - 10/07/24. Mammogram 01/07/24 - birads I. Colonoscopy 2018.

## 2024-10-12 NOTE — Assessment & Plan Note (Signed)
 Nasal congestion/post nasal drainage. Started allegra. Flonase  as directed. Follow. Call with update.

## 2024-10-13 ENCOUNTER — Ambulatory Visit

## 2024-10-16 ENCOUNTER — Ambulatory Visit (INDEPENDENT_AMBULATORY_CARE_PROVIDER_SITE_OTHER)

## 2024-10-16 DIAGNOSIS — E538 Deficiency of other specified B group vitamins: Secondary | ICD-10-CM | POA: Diagnosis not present

## 2024-10-16 MED ORDER — CYANOCOBALAMIN 1000 MCG/ML IJ SOLN
1000.0000 ug | Freq: Once | INTRAMUSCULAR | Status: AC
  Administered 2024-10-16: 1000 ug via INTRAMUSCULAR

## 2024-10-16 NOTE — Progress Notes (Signed)
 Patient presented for B 12 injection to left deltoid, patient voiced no concerns nor showed any signs of distress during injection.

## 2024-11-17 ENCOUNTER — Ambulatory Visit

## 2024-11-17 DIAGNOSIS — E538 Deficiency of other specified B group vitamins: Secondary | ICD-10-CM | POA: Diagnosis not present

## 2024-11-17 MED ORDER — CYANOCOBALAMIN 1000 MCG/ML IJ SOLN
1000.0000 ug | Freq: Once | INTRAMUSCULAR | Status: AC
  Administered 2024-11-17: 14:00:00 1000 ug via INTRAMUSCULAR

## 2024-11-17 NOTE — Progress Notes (Signed)
 Pt presented for their vitamin B12 injection. Pt was identified through two identifiers. Pt tolerated shot well in their right deltoid.

## 2024-12-04 ENCOUNTER — Telehealth: Admitting: Emergency Medicine

## 2024-12-04 DIAGNOSIS — J101 Influenza due to other identified influenza virus with other respiratory manifestations: Secondary | ICD-10-CM

## 2024-12-04 MED ORDER — BENZONATATE 100 MG PO CAPS: 100.0000 mg | ORAL_CAPSULE | Freq: Three times a day (TID) | ORAL | 0 refills | Status: AC | PRN

## 2024-12-04 MED ORDER — OSELTAMIVIR PHOSPHATE 75 MG PO CAPS: 75.0000 mg | ORAL_CAPSULE | Freq: Two times a day (BID) | ORAL | 0 refills | Status: AC

## 2024-12-04 NOTE — Progress Notes (Signed)
 E visit for Flu like symptoms   We are sorry that you are not feeling well.  Here is how we plan to help! Based on what you have shared with me it looks like you have the flu.  Influenza or the flu is  an infection caused by a respiratory virus. The flu virus is highly contagious and persons who did not receive their yearly flu vaccination may catch the flu from close contact.  We have anti-viral medications to treat the viruses that cause this infection. They are not a cure and only shorten the course of the infection. These prescriptions are most effective when they are given within the first 2 days of flu symptoms. Antiviral medications are indicated if you have a high risk of complications from the flu. You should  also consider an antiviral medication if you are in close contact with someone who is at risk. These medications can help patients avoid complications from the flu but have side effects that you should know.   Possible side effects from Tamiflu  or oseltamivir  include nausea, vomiting, diarrhea, dizziness, headaches, eye redness, sleep problems or other respiratory symptoms. You should not take Tamiflu  if you have an allergy to oseltamivir  or any to the ingredients in Tamiflu .  Based upon your symptoms and potential risk factors I have prescribed Oseltamivir  (Tamiflu ).  It has been sent to your designated pharmacy.  You will take one 75 mg capsule orally twice a day for the next 5 days.   For nasal congestion, you may use an oral decongestant such as Mucinex  D or if you have glaucoma or high blood pressure use plain Mucinex .  Saline nasal spray or nasal drops can help and can safely be used as often as needed for congestion.  If you have a sore or scratchy throat, use a saltwater gargle-  to  teaspoon of salt dissolved in a 4-ounce to 8-ounce glass of warm water.  Gargle the solution for approximately 15-30 seconds and then spit.  It is important not to swallow the solution.   You can also use throat lozenges/cough drops and Chloraseptic spray to help with throat pain or discomfort.  Warm or cold liquids can also be helpful in relieving throat pain.  For headache, pain or general discomfort, you can use Ibuprofen or Tylenol as directed.   Some authorities believe that zinc sprays or the use of Echinacea may shorten the course of your symptoms.  I have prescribed the following medications to help lessen symptoms: I have prescribed Tessalon  Perles 100 mg. You may take 1-2 capsules every 8 hours as needed for cough  You are to isolate at home until you have been fever-free for at least 24 hours without a fever-reducing medication, and symptoms have been steadily improving for 24 hours.  If you must be around other household members who do not have symptoms, you need to make sure that both you and the family members are masking consistently with a high-quality mask.  If you note any worsening of symptoms despite treatment, please seek an in-person evaluation ASAP. If you note any significant shortness of breath or any chest pain, please seek ED evaluation. Please do not delay care!  ANYONE WHO HAS FLU SYMPTOMS SHOULD: Stay home. The flu is highly contagious and going out or to work exposes others! Be sure to drink plenty of fluids. Water is fine as well as fruit juices, sodas and electrolyte beverages. You may want to stay away from caffeine or alcohol. If  you are nauseated, try taking small sips of liquids. How do you know if you are getting enough fluid? Your urine should be a pale yellow or almost colorless. Get rest. Taking a steamy shower or using a humidifier may help nasal congestion and ease sore throat pain. Using a saline nasal spray works much the same way. Cough drops, hard candies and sore throat lozenges may ease your cough. Line up a caregiver. Have someone check on you regularly.  GET HELP RIGHT AWAY IF: You cannot keep down liquids or your  medications. You become short of breath Your fell like you are going to pass out or loose consciousness. Your symptoms persist after you have completed your treatment plan  MAKE SURE YOU  Understand these instructions. Will watch your condition. Will get help right away if you are not doing well or get worse.  Your e-visit answers were reviewed by a board certified advanced clinical practitioner to complete your personal care plan.  Depending on the condition, your plan could have included both over the counter or prescription medications.  If there is a problem please reply  once you have received a response from your provider.  Your safety is important to us .  If you have drug allergies check your prescription carefully.    You can use MyChart to ask questions about todays visit, request a non-urgent call back, or ask for a work or school excuse for 24 hours related to this e-Visit. If it has been greater than 24 hours you will need to follow up with your provider, or enter a new e-Visit to address those concerns.  You will get an e-mail in the next two days asking about your experience.  I hope that your e-visit has been valuable and will speed your recovery. Thank you for using e-visits.   I have spent 5 minutes in review of e-visit questionnaire, review and updating patient chart, medical decision making and response to patient.   Elsie Velma Lunger, PA-C

## 2024-12-09 ENCOUNTER — Other Ambulatory Visit: Payer: Self-pay | Admitting: Internal Medicine

## 2024-12-09 ENCOUNTER — Telehealth: Payer: Self-pay

## 2024-12-09 NOTE — Telephone Encounter (Signed)
 Need more informatio. Is she having any other symptoms? Any sob? Eating and drinking? Increased chest congestion?  Coughing fits? What has she tried for cough?

## 2024-12-09 NOTE — Telephone Encounter (Signed)
 Spoke with pt and she sated that she is still having chest congestion, cough(cough sounds loose), occasional SOBr when cough(but she stated that has gotten better), does have loss of appetite but knows its because she just had the flu. Pt is currently taking OC cough DM. Has been taking it for 2 days every 12 hours. Pt would like to know if a cough medication could be sent in. Pt is aware that Dr. Glendia is gone for the day but we will give her a call back in the morning. Pt stated that was fine.

## 2024-12-09 NOTE — Telephone Encounter (Signed)
 Copied from CRM [id number]. Topic: Clinical - Medication Question >> Dec 09, 2024 10:32 AM Charolett L wrote: Reason for CRM: Patient called in and stated tat she is on the last day of using the tamaflu and she stated that she still have a persistent cough and wanted to be prescribed something for the cough. Patient requesting a call back

## 2024-12-10 MED ORDER — BENZONATATE 100 MG PO CAPS: 100.0000 mg | ORAL_CAPSULE | Freq: Three times a day (TID) | ORAL | 0 refills | Status: AC | PRN

## 2024-12-10 NOTE — Telephone Encounter (Signed)
 Spoke to pt. Pt is in agreement with trying medicine. Pt stated that she has not tried before but willing to see if this will help. Pt agreed to be reevaluated if symptoms do not improve. Medication pended

## 2024-12-10 NOTE — Addendum Note (Signed)
 Addended by: Carri Spillers on: 12/10/2024 08:56 AM   Modules accepted: Orders

## 2024-12-10 NOTE — Telephone Encounter (Signed)
 See if she has ever taken tessalon  perles for cough. If agreeable, I can send in rx for tessalone perles 100mg  tid prn cough #21. I can send in rx. If persistent symptoms or problems, she needs to let us  know and will need to be reevaluated.

## 2024-12-18 ENCOUNTER — Ambulatory Visit

## 2024-12-18 DIAGNOSIS — E538 Deficiency of other specified B group vitamins: Secondary | ICD-10-CM

## 2024-12-18 MED ORDER — CYANOCOBALAMIN 1000 MCG/ML IJ SOLN
1000.0000 ug | Freq: Once | INTRAMUSCULAR | Status: AC
  Administered 2024-12-18: 1000 ug via INTRAMUSCULAR

## 2024-12-18 NOTE — Progress Notes (Signed)
After obtaining consent, and per orders of Dr. Lorin Picket, injection of B-12 given IM in left deltoid by Valentino Nose. Patient tolerated injection well.

## 2024-12-19 ENCOUNTER — Encounter: Payer: Self-pay | Admitting: Internal Medicine

## 2024-12-19 NOTE — Telephone Encounter (Signed)
 Pt is aware that she received this years flu vaccine.  All questions were answered.

## 2024-12-24 ENCOUNTER — Ambulatory Visit

## 2025-01-19 ENCOUNTER — Ambulatory Visit

## 2025-02-03 ENCOUNTER — Other Ambulatory Visit

## 2025-02-05 ENCOUNTER — Ambulatory Visit: Admitting: Internal Medicine

## 2025-04-06 ENCOUNTER — Ambulatory Visit
